# Patient Record
Sex: Male | Born: 1970 | Race: Black or African American | Hispanic: No | Marital: Married | Smoking: Former smoker
Health system: Southern US, Community
[De-identification: ages and names within clinical notes are randomized; demographics above are authoritative.]

## PROBLEM LIST (undated history)

## (undated) DIAGNOSIS — R519 Headache, unspecified: Secondary | ICD-10-CM

## (undated) DIAGNOSIS — G4730 Sleep apnea, unspecified: Secondary | ICD-10-CM

## (undated) DIAGNOSIS — M5430 Sciatica, unspecified side: Secondary | ICD-10-CM

## (undated) DIAGNOSIS — G8929 Other chronic pain: Secondary | ICD-10-CM

## (undated) DIAGNOSIS — I10 Essential (primary) hypertension: Secondary | ICD-10-CM

## (undated) DIAGNOSIS — E785 Hyperlipidemia, unspecified: Secondary | ICD-10-CM

## (undated) DIAGNOSIS — L0291 Cutaneous abscess, unspecified: Secondary | ICD-10-CM

## (undated) DIAGNOSIS — J40 Bronchitis, not specified as acute or chronic: Secondary | ICD-10-CM

## (undated) DIAGNOSIS — M549 Dorsalgia, unspecified: Secondary | ICD-10-CM

## (undated) DIAGNOSIS — R51 Headache: Secondary | ICD-10-CM

## (undated) DIAGNOSIS — G4733 Obstructive sleep apnea (adult) (pediatric): Secondary | ICD-10-CM

## (undated) HISTORY — DX: Obstructive sleep apnea (adult) (pediatric): G47.33

## (undated) HISTORY — PX: NO PAST SURGERIES: SHX2092

---

## 2005-03-08 ENCOUNTER — Emergency Department (HOSPITAL_COMMUNITY): Admission: EM | Admit: 2005-03-08 | Discharge: 2005-03-08 | Payer: Self-pay | Admitting: Emergency Medicine

## 2007-08-21 ENCOUNTER — Emergency Department (HOSPITAL_COMMUNITY): Admission: EM | Admit: 2007-08-21 | Discharge: 2007-08-21 | Payer: Self-pay | Admitting: Emergency Medicine

## 2007-09-07 ENCOUNTER — Emergency Department (HOSPITAL_COMMUNITY): Admission: EM | Admit: 2007-09-07 | Discharge: 2007-09-07 | Payer: Self-pay | Admitting: Emergency Medicine

## 2007-10-09 ENCOUNTER — Emergency Department (HOSPITAL_COMMUNITY): Admission: EM | Admit: 2007-10-09 | Discharge: 2007-10-10 | Payer: Self-pay | Admitting: Emergency Medicine

## 2009-01-22 ENCOUNTER — Encounter: Admission: RE | Admit: 2009-01-22 | Discharge: 2009-01-22 | Payer: Self-pay | Admitting: Internal Medicine

## 2009-04-16 ENCOUNTER — Emergency Department (HOSPITAL_COMMUNITY): Admission: EM | Admit: 2009-04-16 | Discharge: 2009-04-16 | Payer: Self-pay | Admitting: Emergency Medicine

## 2009-04-23 ENCOUNTER — Emergency Department (HOSPITAL_COMMUNITY): Admission: EM | Admit: 2009-04-23 | Discharge: 2009-04-24 | Payer: Self-pay | Admitting: Emergency Medicine

## 2009-05-03 ENCOUNTER — Emergency Department (HOSPITAL_COMMUNITY): Admission: EM | Admit: 2009-05-03 | Discharge: 2009-05-03 | Payer: Self-pay | Admitting: Emergency Medicine

## 2009-05-04 ENCOUNTER — Emergency Department (HOSPITAL_COMMUNITY): Admission: EM | Admit: 2009-05-04 | Discharge: 2009-05-04 | Payer: Self-pay | Admitting: Emergency Medicine

## 2009-05-10 ENCOUNTER — Emergency Department (HOSPITAL_COMMUNITY): Admission: EM | Admit: 2009-05-10 | Discharge: 2009-05-11 | Payer: Self-pay | Admitting: Emergency Medicine

## 2009-05-22 ENCOUNTER — Emergency Department (HOSPITAL_COMMUNITY): Admission: EM | Admit: 2009-05-22 | Discharge: 2009-05-22 | Payer: Self-pay | Admitting: Emergency Medicine

## 2009-06-28 ENCOUNTER — Emergency Department (HOSPITAL_COMMUNITY): Admission: EM | Admit: 2009-06-28 | Discharge: 2009-06-28 | Payer: Self-pay | Admitting: Emergency Medicine

## 2009-07-08 ENCOUNTER — Emergency Department (HOSPITAL_COMMUNITY): Admission: EM | Admit: 2009-07-08 | Discharge: 2009-07-08 | Payer: Self-pay | Admitting: Emergency Medicine

## 2009-07-17 ENCOUNTER — Emergency Department (HOSPITAL_COMMUNITY): Admission: EM | Admit: 2009-07-17 | Discharge: 2009-07-17 | Payer: Self-pay | Admitting: Emergency Medicine

## 2009-08-01 ENCOUNTER — Emergency Department (HOSPITAL_COMMUNITY): Admission: EM | Admit: 2009-08-01 | Discharge: 2009-08-02 | Payer: Self-pay | Admitting: Emergency Medicine

## 2009-08-02 ENCOUNTER — Emergency Department (HOSPITAL_COMMUNITY): Admission: EM | Admit: 2009-08-02 | Discharge: 2009-08-02 | Payer: Self-pay | Admitting: Emergency Medicine

## 2009-08-10 ENCOUNTER — Emergency Department (HOSPITAL_COMMUNITY): Admission: EM | Admit: 2009-08-10 | Discharge: 2009-08-10 | Payer: Self-pay | Admitting: Emergency Medicine

## 2009-08-16 ENCOUNTER — Emergency Department (HOSPITAL_COMMUNITY): Admission: EM | Admit: 2009-08-16 | Discharge: 2009-08-16 | Payer: Self-pay | Admitting: Emergency Medicine

## 2009-08-21 ENCOUNTER — Emergency Department (HOSPITAL_COMMUNITY): Admission: EM | Admit: 2009-08-21 | Discharge: 2009-08-21 | Payer: Self-pay | Admitting: Emergency Medicine

## 2009-08-26 ENCOUNTER — Emergency Department (HOSPITAL_COMMUNITY): Admission: EM | Admit: 2009-08-26 | Discharge: 2009-08-26 | Payer: Self-pay | Admitting: Emergency Medicine

## 2009-09-20 ENCOUNTER — Emergency Department (HOSPITAL_COMMUNITY): Admission: EM | Admit: 2009-09-20 | Discharge: 2009-09-20 | Payer: Self-pay | Admitting: Emergency Medicine

## 2009-09-25 ENCOUNTER — Emergency Department (HOSPITAL_COMMUNITY): Admission: EM | Admit: 2009-09-25 | Discharge: 2009-09-25 | Payer: Self-pay | Admitting: Emergency Medicine

## 2009-10-07 ENCOUNTER — Emergency Department (HOSPITAL_COMMUNITY): Admission: EM | Admit: 2009-10-07 | Discharge: 2009-10-07 | Payer: Self-pay | Admitting: Emergency Medicine

## 2009-12-12 ENCOUNTER — Emergency Department (HOSPITAL_COMMUNITY): Admission: EM | Admit: 2009-12-12 | Discharge: 2009-12-12 | Payer: Self-pay | Admitting: Emergency Medicine

## 2010-01-05 ENCOUNTER — Emergency Department (HOSPITAL_COMMUNITY): Admission: EM | Admit: 2010-01-05 | Discharge: 2010-01-05 | Payer: Self-pay | Admitting: Emergency Medicine

## 2010-04-27 ENCOUNTER — Emergency Department (HOSPITAL_COMMUNITY): Admission: EM | Admit: 2010-04-27 | Discharge: 2010-04-28 | Payer: Self-pay | Admitting: Emergency Medicine

## 2010-05-23 ENCOUNTER — Emergency Department (HOSPITAL_COMMUNITY)
Admission: EM | Admit: 2010-05-23 | Discharge: 2010-05-23 | Payer: Self-pay | Source: Home / Self Care | Admitting: Emergency Medicine

## 2010-06-12 ENCOUNTER — Emergency Department (HOSPITAL_COMMUNITY)
Admission: EM | Admit: 2010-06-12 | Discharge: 2010-06-12 | Payer: Self-pay | Source: Home / Self Care | Admitting: Emergency Medicine

## 2010-07-25 ENCOUNTER — Emergency Department (HOSPITAL_COMMUNITY): Payer: Medicaid Other

## 2010-07-25 ENCOUNTER — Emergency Department (HOSPITAL_COMMUNITY)
Admission: EM | Admit: 2010-07-25 | Discharge: 2010-07-25 | Disposition: A | Discharge status: Home / Self Care | Payer: Medicaid Other | Attending: Emergency Medicine | Admitting: Emergency Medicine

## 2010-07-25 DIAGNOSIS — M7989 Other specified soft tissue disorders: Secondary | ICD-10-CM | POA: Insufficient documentation

## 2010-07-25 DIAGNOSIS — E119 Type 2 diabetes mellitus without complications: Secondary | ICD-10-CM | POA: Insufficient documentation

## 2010-07-25 DIAGNOSIS — S5010XA Contusion of unspecified forearm, initial encounter: Secondary | ICD-10-CM | POA: Insufficient documentation

## 2010-07-25 DIAGNOSIS — S59909A Unspecified injury of unspecified elbow, initial encounter: Secondary | ICD-10-CM | POA: Insufficient documentation

## 2010-07-25 DIAGNOSIS — Z79899 Other long term (current) drug therapy: Secondary | ICD-10-CM | POA: Insufficient documentation

## 2010-07-25 DIAGNOSIS — Y93E1 Activity, personal bathing and showering: Secondary | ICD-10-CM | POA: Insufficient documentation

## 2010-07-25 DIAGNOSIS — S6990XA Unspecified injury of unspecified wrist, hand and finger(s), initial encounter: Secondary | ICD-10-CM | POA: Insufficient documentation

## 2010-07-25 DIAGNOSIS — G8929 Other chronic pain: Secondary | ICD-10-CM | POA: Insufficient documentation

## 2010-07-25 DIAGNOSIS — W010XXA Fall on same level from slipping, tripping and stumbling without subsequent striking against object, initial encounter: Secondary | ICD-10-CM | POA: Insufficient documentation

## 2010-07-25 DIAGNOSIS — Y92009 Unspecified place in unspecified non-institutional (private) residence as the place of occurrence of the external cause: Secondary | ICD-10-CM | POA: Insufficient documentation

## 2010-07-25 DIAGNOSIS — S59919A Unspecified injury of unspecified forearm, initial encounter: Secondary | ICD-10-CM | POA: Insufficient documentation

## 2010-07-25 DIAGNOSIS — J45909 Unspecified asthma, uncomplicated: Secondary | ICD-10-CM | POA: Insufficient documentation

## 2010-07-25 DIAGNOSIS — I10 Essential (primary) hypertension: Secondary | ICD-10-CM | POA: Insufficient documentation

## 2010-08-30 LAB — GLUCOSE, CAPILLARY

## 2010-08-31 ENCOUNTER — Emergency Department (HOSPITAL_COMMUNITY)
Admission: EM | Admit: 2010-08-31 | Discharge: 2010-09-01 | Disposition: A | Discharge status: Home / Self Care | Payer: Medicaid Other | Attending: Emergency Medicine | Admitting: Emergency Medicine

## 2010-08-31 DIAGNOSIS — X500XXA Overexertion from strenuous movement or load, initial encounter: Secondary | ICD-10-CM | POA: Insufficient documentation

## 2010-08-31 DIAGNOSIS — S93409A Sprain of unspecified ligament of unspecified ankle, initial encounter: Secondary | ICD-10-CM | POA: Insufficient documentation

## 2010-08-31 DIAGNOSIS — Y92009 Unspecified place in unspecified non-institutional (private) residence as the place of occurrence of the external cause: Secondary | ICD-10-CM | POA: Insufficient documentation

## 2010-09-01 ENCOUNTER — Emergency Department (HOSPITAL_COMMUNITY): Payer: Medicaid Other

## 2010-09-03 LAB — DIFFERENTIAL
Basophils Absolute: 0 10*3/uL (ref 0.0–0.1)
Basophils Relative: 0 % (ref 0–1)
Lymphocytes Relative: 26 % (ref 12–46)
Monocytes Relative: 6 % (ref 3–12)
Neutro Abs: 5.7 10*3/uL (ref 1.7–7.7)
Neutrophils Relative %: 66 % (ref 43–77)

## 2010-09-03 LAB — CBC
Hemoglobin: 15 g/dL (ref 13.0–17.0)
MCHC: 33.3 g/dL (ref 30.0–36.0)
RDW: 12.9 % (ref 11.5–15.5)
WBC: 8.6 10*3/uL (ref 4.0–10.5)

## 2010-09-07 LAB — GLUCOSE, CAPILLARY: Glucose-Capillary: 119 mg/dL — ABNORMAL HIGH (ref 70–99)

## 2010-09-11 LAB — GLUCOSE, CAPILLARY: Glucose-Capillary: 238 mg/dL — ABNORMAL HIGH (ref 70–99)

## 2010-09-20 ENCOUNTER — Emergency Department (HOSPITAL_COMMUNITY)
Admission: EM | Admit: 2010-09-20 | Discharge: 2010-09-20 | Disposition: A | Discharge status: Home / Self Care | Payer: Medicaid Other | Attending: Emergency Medicine | Admitting: Emergency Medicine

## 2010-09-20 ENCOUNTER — Emergency Department (HOSPITAL_COMMUNITY): Payer: Medicaid Other

## 2010-09-20 DIAGNOSIS — R0789 Other chest pain: Secondary | ICD-10-CM | POA: Insufficient documentation

## 2010-09-20 DIAGNOSIS — G4730 Sleep apnea, unspecified: Secondary | ICD-10-CM | POA: Insufficient documentation

## 2010-09-20 DIAGNOSIS — J45909 Unspecified asthma, uncomplicated: Secondary | ICD-10-CM | POA: Insufficient documentation

## 2010-09-20 DIAGNOSIS — Z79899 Other long term (current) drug therapy: Secondary | ICD-10-CM | POA: Insufficient documentation

## 2010-09-20 DIAGNOSIS — E119 Type 2 diabetes mellitus without complications: Secondary | ICD-10-CM | POA: Insufficient documentation

## 2010-09-20 DIAGNOSIS — I10 Essential (primary) hypertension: Secondary | ICD-10-CM | POA: Insufficient documentation

## 2010-09-20 DIAGNOSIS — E669 Obesity, unspecified: Secondary | ICD-10-CM | POA: Insufficient documentation

## 2010-09-20 DIAGNOSIS — W208XXA Other cause of strike by thrown, projected or falling object, initial encounter: Secondary | ICD-10-CM | POA: Insufficient documentation

## 2010-09-20 DIAGNOSIS — Y92009 Unspecified place in unspecified non-institutional (private) residence as the place of occurrence of the external cause: Secondary | ICD-10-CM | POA: Insufficient documentation

## 2010-09-20 LAB — COMPREHENSIVE METABOLIC PANEL
AST: 20 U/L (ref 0–37)
BUN: 11 mg/dL (ref 6–23)
CO2: 26 mEq/L (ref 19–32)
Chloride: 99 mEq/L (ref 96–112)
Creatinine, Ser: 0.72 mg/dL (ref 0.4–1.5)
GFR calc Af Amer: >60 mL/min (ref >60)
GFR calc non Af Amer: >60 mL/min (ref >60)
Glucose, Bld: 344 mg/dL — ABNORMAL HIGH (ref 70–99)
Total Bilirubin: 0.5 mg/dL (ref 0.3–1.2)

## 2010-09-20 LAB — DIFFERENTIAL
Basophils Absolute: 0 10*3/uL (ref 0.0–0.1)
Eosinophils Relative: 3 % (ref 0–5)
Lymphocytes Relative: 45 % (ref 12–46)

## 2010-09-20 LAB — URINALYSIS, ROUTINE W REFLEX MICROSCOPIC
Bilirubin Urine: NEGATIVE
Glucose, UA: >1000 mg/dL — AB
Hgb urine dipstick: NEGATIVE
Protein, ur: NEGATIVE mg/dL

## 2010-09-20 LAB — CBC
HCT: 44 % (ref 39.0–52.0)
Hemoglobin: 14.7 g/dL (ref 13.0–17.0)
MCV: 85.2 fL (ref 78.0–100.0)
RBC: 5.17 MIL/uL (ref 4.22–5.81)
WBC: 6.6 10*3/uL (ref 4.0–10.5)

## 2010-09-20 LAB — LIPASE, BLOOD: Lipase: 23 U/L (ref 11–59)

## 2010-09-21 LAB — BASIC METABOLIC PANEL
BUN: 12 mg/dL (ref 6–23)
BUN: 12 mg/dL (ref 6–23)
CO2: 27 mEq/L (ref 19–32)
Calcium: 9.3 mg/dL (ref 8.4–10.5)
Chloride: 102 mEq/L (ref 96–112)
Creatinine, Ser: 1.13 mg/dL (ref 0.4–1.5)
GFR calc non Af Amer: >60 mL/min (ref >60)
Glucose, Bld: 319 mg/dL — ABNORMAL HIGH (ref 70–99)
Glucose, Bld: 311 mg/dL — ABNORMAL HIGH (ref 70–99)
Sodium: 137 mEq/L (ref 135–145)

## 2010-09-21 LAB — URINALYSIS, ROUTINE W REFLEX MICROSCOPIC
Bilirubin Urine: NEGATIVE
Bilirubin Urine: NEGATIVE
Ketones, ur: NEGATIVE mg/dL
Nitrite: NEGATIVE
Nitrite: NEGATIVE
Protein, ur: NEGATIVE mg/dL
Specific Gravity, Urine: 1.01 (ref 1.005–1.030)
Specific Gravity, Urine: 1.02 (ref 1.005–1.030)
Urobilinogen, UA: 0.2 mg/dL (ref 0.0–1.0)
pH: 5.5 (ref 5.0–8.0)

## 2010-09-21 LAB — CBC
MCHC: 33.5 g/dL (ref 30.0–36.0)
MCV: 85.7 fL (ref 78.0–100.0)
Platelets: 248 10*3/uL (ref 150–400)

## 2010-09-21 LAB — DIFFERENTIAL
Basophils Relative: 0 % (ref 0–1)
Eosinophils Absolute: 0.1 10*3/uL (ref 0.0–0.7)
Eosinophils Relative: 2 % (ref 0–5)
Monocytes Relative: 8 % (ref 3–12)
Neutrophils Relative %: 48 % (ref 43–77)

## 2010-09-21 LAB — TYPE AND SCREEN

## 2010-09-21 LAB — URINE MICROSCOPIC-ADD ON

## 2010-09-21 LAB — PROTIME-INR
INR: 1 (ref 0.00–1.49)
Prothrombin Time: 13.4 seconds (ref 11.6–15.2)

## 2010-09-30 ENCOUNTER — Emergency Department (HOSPITAL_COMMUNITY)
Admission: EM | Admit: 2010-09-30 | Discharge: 2010-10-01 | Disposition: A | Discharge status: Home / Self Care | Payer: Medicaid Other | Attending: Emergency Medicine | Admitting: Emergency Medicine

## 2010-09-30 DIAGNOSIS — E119 Type 2 diabetes mellitus without complications: Secondary | ICD-10-CM | POA: Insufficient documentation

## 2010-09-30 DIAGNOSIS — I10 Essential (primary) hypertension: Secondary | ICD-10-CM | POA: Insufficient documentation

## 2010-09-30 DIAGNOSIS — L03119 Cellulitis of unspecified part of limb: Secondary | ICD-10-CM | POA: Insufficient documentation

## 2010-09-30 DIAGNOSIS — J45909 Unspecified asthma, uncomplicated: Secondary | ICD-10-CM | POA: Insufficient documentation

## 2010-09-30 DIAGNOSIS — L02419 Cutaneous abscess of limb, unspecified: Secondary | ICD-10-CM | POA: Insufficient documentation

## 2010-10-16 ENCOUNTER — Emergency Department (HOSPITAL_COMMUNITY)
Admission: EM | Admit: 2010-10-16 | Discharge: 2010-10-16 | Disposition: A | Discharge status: Home / Self Care | Payer: Medicaid Other | Attending: Emergency Medicine | Admitting: Emergency Medicine

## 2010-10-16 ENCOUNTER — Emergency Department (HOSPITAL_COMMUNITY): Payer: Medicaid Other

## 2010-10-16 DIAGNOSIS — J45909 Unspecified asthma, uncomplicated: Secondary | ICD-10-CM | POA: Insufficient documentation

## 2010-10-16 DIAGNOSIS — M545 Low back pain, unspecified: Secondary | ICD-10-CM | POA: Insufficient documentation

## 2010-10-16 DIAGNOSIS — I10 Essential (primary) hypertension: Secondary | ICD-10-CM | POA: Insufficient documentation

## 2010-10-16 DIAGNOSIS — E119 Type 2 diabetes mellitus without complications: Secondary | ICD-10-CM | POA: Insufficient documentation

## 2010-10-16 DIAGNOSIS — Y9289 Other specified places as the place of occurrence of the external cause: Secondary | ICD-10-CM | POA: Insufficient documentation

## 2010-10-16 DIAGNOSIS — M542 Cervicalgia: Secondary | ICD-10-CM | POA: Insufficient documentation

## 2010-10-16 DIAGNOSIS — E669 Obesity, unspecified: Secondary | ICD-10-CM | POA: Insufficient documentation

## 2010-10-16 DIAGNOSIS — F17200 Nicotine dependence, unspecified, uncomplicated: Secondary | ICD-10-CM | POA: Insufficient documentation

## 2010-10-16 DIAGNOSIS — Z79899 Other long term (current) drug therapy: Secondary | ICD-10-CM | POA: Insufficient documentation

## 2010-10-16 DIAGNOSIS — G8929 Other chronic pain: Secondary | ICD-10-CM | POA: Insufficient documentation

## 2010-11-11 ENCOUNTER — Emergency Department (HOSPITAL_COMMUNITY)
Admission: EM | Admit: 2010-11-11 | Discharge: 2010-11-11 | Disposition: A | Discharge status: Home / Self Care | Payer: Medicaid Other | Attending: Emergency Medicine | Admitting: Emergency Medicine

## 2010-11-11 DIAGNOSIS — Z79899 Other long term (current) drug therapy: Secondary | ICD-10-CM | POA: Insufficient documentation

## 2010-11-11 DIAGNOSIS — J45909 Unspecified asthma, uncomplicated: Secondary | ICD-10-CM | POA: Insufficient documentation

## 2010-11-11 DIAGNOSIS — E119 Type 2 diabetes mellitus without complications: Secondary | ICD-10-CM | POA: Insufficient documentation

## 2010-11-11 DIAGNOSIS — F17200 Nicotine dependence, unspecified, uncomplicated: Secondary | ICD-10-CM | POA: Insufficient documentation

## 2010-11-11 DIAGNOSIS — L0233 Carbuncle of buttock: Secondary | ICD-10-CM | POA: Insufficient documentation

## 2010-11-11 DIAGNOSIS — I10 Essential (primary) hypertension: Secondary | ICD-10-CM | POA: Insufficient documentation

## 2010-11-11 DIAGNOSIS — G4730 Sleep apnea, unspecified: Secondary | ICD-10-CM | POA: Insufficient documentation

## 2010-11-28 ENCOUNTER — Emergency Department (HOSPITAL_COMMUNITY)
Admission: EM | Admit: 2010-11-28 | Discharge: 2010-11-28 | Disposition: A | Discharge status: Home / Self Care | Payer: Medicaid Other | Attending: Emergency Medicine | Admitting: Emergency Medicine

## 2010-11-28 DIAGNOSIS — S335XXA Sprain of ligaments of lumbar spine, initial encounter: Secondary | ICD-10-CM | POA: Insufficient documentation

## 2010-11-28 DIAGNOSIS — M545 Low back pain, unspecified: Secondary | ICD-10-CM | POA: Insufficient documentation

## 2010-11-28 DIAGNOSIS — I10 Essential (primary) hypertension: Secondary | ICD-10-CM | POA: Insufficient documentation

## 2010-11-28 DIAGNOSIS — E119 Type 2 diabetes mellitus without complications: Secondary | ICD-10-CM | POA: Insufficient documentation

## 2010-11-28 DIAGNOSIS — J45909 Unspecified asthma, uncomplicated: Secondary | ICD-10-CM | POA: Insufficient documentation

## 2010-11-28 DIAGNOSIS — X58XXXA Exposure to other specified factors, initial encounter: Secondary | ICD-10-CM | POA: Insufficient documentation

## 2010-12-02 ENCOUNTER — Emergency Department (HOSPITAL_COMMUNITY)
Admission: EM | Admit: 2010-12-02 | Discharge: 2010-12-02 | Disposition: A | Discharge status: Home / Self Care | Payer: Medicaid Other | Attending: Emergency Medicine | Admitting: Emergency Medicine

## 2010-12-02 DIAGNOSIS — E119 Type 2 diabetes mellitus without complications: Secondary | ICD-10-CM | POA: Insufficient documentation

## 2010-12-02 DIAGNOSIS — R1013 Epigastric pain: Secondary | ICD-10-CM | POA: Insufficient documentation

## 2010-12-02 DIAGNOSIS — J45909 Unspecified asthma, uncomplicated: Secondary | ICD-10-CM | POA: Insufficient documentation

## 2010-12-02 DIAGNOSIS — I10 Essential (primary) hypertension: Secondary | ICD-10-CM | POA: Insufficient documentation

## 2010-12-02 DIAGNOSIS — F17200 Nicotine dependence, unspecified, uncomplicated: Secondary | ICD-10-CM | POA: Insufficient documentation

## 2010-12-02 DIAGNOSIS — K649 Unspecified hemorrhoids: Secondary | ICD-10-CM | POA: Insufficient documentation

## 2010-12-02 LAB — URINALYSIS, ROUTINE W REFLEX MICROSCOPIC
Glucose, UA: 100 mg/dL — AB
Hgb urine dipstick: NEGATIVE
Ketones, ur: NEGATIVE mg/dL
Protein, ur: NEGATIVE mg/dL
pH: 5.5 (ref 5.0–8.0)

## 2010-12-19 ENCOUNTER — Emergency Department (HOSPITAL_COMMUNITY)
Admission: EM | Admit: 2010-12-19 | Discharge: 2010-12-20 | Disposition: A | Discharge status: Home / Self Care | Payer: Medicaid Other | Attending: Emergency Medicine | Admitting: Emergency Medicine

## 2010-12-19 ENCOUNTER — Emergency Department (HOSPITAL_COMMUNITY): Payer: Medicaid Other

## 2010-12-19 DIAGNOSIS — S93409A Sprain of unspecified ligament of unspecified ankle, initial encounter: Secondary | ICD-10-CM | POA: Insufficient documentation

## 2010-12-19 DIAGNOSIS — I10 Essential (primary) hypertension: Secondary | ICD-10-CM | POA: Insufficient documentation

## 2010-12-19 DIAGNOSIS — E119 Type 2 diabetes mellitus without complications: Secondary | ICD-10-CM | POA: Insufficient documentation

## 2010-12-19 DIAGNOSIS — X500XXA Overexertion from strenuous movement or load, initial encounter: Secondary | ICD-10-CM | POA: Insufficient documentation

## 2010-12-19 DIAGNOSIS — M25579 Pain in unspecified ankle and joints of unspecified foot: Secondary | ICD-10-CM | POA: Insufficient documentation

## 2010-12-24 ENCOUNTER — Emergency Department (HOSPITAL_COMMUNITY)
Admission: EM | Admit: 2010-12-24 | Discharge: 2010-12-24 | Discharge status: Against Medical Advice | Payer: Medicaid Other | Attending: Emergency Medicine | Admitting: Emergency Medicine

## 2010-12-24 DIAGNOSIS — Z0389 Encounter for observation for other suspected diseases and conditions ruled out: Secondary | ICD-10-CM | POA: Insufficient documentation

## 2010-12-27 ENCOUNTER — Encounter: Payer: Self-pay | Admitting: *Deleted

## 2010-12-27 ENCOUNTER — Emergency Department (HOSPITAL_COMMUNITY)
Admission: EM | Admit: 2010-12-27 | Discharge: 2010-12-27 | Disposition: A | Discharge status: Home / Self Care | Payer: Medicaid Other | Attending: Emergency Medicine | Admitting: Emergency Medicine

## 2010-12-27 DIAGNOSIS — I10 Essential (primary) hypertension: Secondary | ICD-10-CM | POA: Insufficient documentation

## 2010-12-27 DIAGNOSIS — E119 Type 2 diabetes mellitus without complications: Secondary | ICD-10-CM | POA: Insufficient documentation

## 2010-12-27 DIAGNOSIS — F17200 Nicotine dependence, unspecified, uncomplicated: Secondary | ICD-10-CM | POA: Insufficient documentation

## 2010-12-27 DIAGNOSIS — IMO0002 Reserved for concepts with insufficient information to code with codable children: Secondary | ICD-10-CM | POA: Insufficient documentation

## 2010-12-27 DIAGNOSIS — L03319 Cellulitis of trunk, unspecified: Secondary | ICD-10-CM

## 2010-12-27 HISTORY — DX: Essential (primary) hypertension: I10

## 2010-12-27 HISTORY — DX: Sleep apnea, unspecified: G47.30

## 2010-12-27 MED ORDER — HYDROCODONE-ACETAMINOPHEN 5-325 MG PO TABS
1.0000 | ORAL_TABLET | Freq: Once | ORAL | Status: AC
  Administered 2010-12-27: 1 via ORAL
  Filled 2010-12-27: qty 1

## 2010-12-27 MED ORDER — DOXYCYCLINE HYCLATE 100 MG PO CAPS: 100.0000 mg | ORAL_CAPSULE | Freq: Two times a day (BID) | ORAL | Status: AC

## 2010-12-27 MED ORDER — DOXYCYCLINE HYCLATE 100 MG PO TABS
100.0000 mg | ORAL_TABLET | Freq: Once | ORAL | Status: AC
  Administered 2010-12-27: 100 mg via ORAL
  Filled 2010-12-27: qty 1

## 2010-12-27 MED ORDER — HYDROCODONE-ACETAMINOPHEN 5-325 MG PO TABS: 2.0000 | ORAL_TABLET | ORAL | Status: AC | PRN

## 2010-12-27 NOTE — ED Notes (Signed)
Patient sitting quietly in chair watching TV. Airway patent. No acute distress noted. Patient given ginger ale per request.

## 2010-12-27 NOTE — ED Provider Notes (Signed)
History     Chief Complaint  Patient presents with  . Abscess   Patient is a 40 y.o. male presenting with abscess. The history is provided by the patient.  Abscess  This is a recurrent problem. Episode onset: 2-3 days. The problem occurs frequently. The problem has been gradually improving. Affected Location: Right axilla. The abscess is characterized by painfulness. Pertinent negatives include no fever.    Past Medical History  Diagnosis Date  . Hypertension   . Diabetes mellitus   . Sleep apnea     History reviewed. No pertinent past surgical history.  No family history on file.  History  Substance Use Topics  . Smoking status: Current Everyday Smoker -- 0.5 packs/day  . Smokeless tobacco: Not on file  . Alcohol Use:       Review of Systems  Constitutional: Negative for fever.  Skin:       Abscess - right axilla  All other systems reviewed and are negative.    Physical Exam  BP 120/58  Pulse 87  Temp(Src) 98.6 F (37 C) (Oral)  Resp 20  Ht 6\' 1"  (1.854 m)  Wt 350 lb (158.759 kg)  BMI 46.18 kg/m2  SpO2 96%  Physical Exam  Constitutional: He is oriented to person, place, and time. He appears well-developed and well-nourished.  Pulmonary/Chest: Effort normal.  Musculoskeletal: Normal range of motion.  Neurological: He is alert and oriented to person, place, and time.  Skin: Skin is warm and dry.       ED Course  Procedures  MDM        Marlon Pel, [place] 12/27/10 1845

## 2010-12-27 NOTE — ED Notes (Signed)
Boil under  Right arm

## 2010-12-28 NOTE — ED Provider Notes (Signed)
Medical screening examination/treatment/procedure(s) were performed by non-physician practitioner and as supervising physician I was immediately available for consultation/collaboration.  Joya Gaskins, MD 12/28/10 [phone]

## 2011-01-04 ENCOUNTER — Emergency Department (HOSPITAL_COMMUNITY): Payer: Medicaid Other

## 2011-01-04 ENCOUNTER — Encounter (HOSPITAL_COMMUNITY): Payer: Self-pay | Admitting: Emergency Medicine

## 2011-01-04 ENCOUNTER — Emergency Department (HOSPITAL_COMMUNITY)
Admission: EM | Admit: 2011-01-04 | Discharge: 2011-01-04 | Disposition: A | Discharge status: Home / Self Care | Payer: Medicaid Other | Attending: Emergency Medicine | Admitting: Emergency Medicine

## 2011-01-04 DIAGNOSIS — M533 Sacrococcygeal disorders, not elsewhere classified: Secondary | ICD-10-CM

## 2011-01-04 DIAGNOSIS — M549 Dorsalgia, unspecified: Secondary | ICD-10-CM | POA: Insufficient documentation

## 2011-01-04 DIAGNOSIS — F17200 Nicotine dependence, unspecified, uncomplicated: Secondary | ICD-10-CM | POA: Insufficient documentation

## 2011-01-04 DIAGNOSIS — W07XXXA Fall from chair, initial encounter: Secondary | ICD-10-CM | POA: Insufficient documentation

## 2011-01-04 MED ORDER — OXYCODONE-ACETAMINOPHEN 5-325 MG PO TABS
1.0000 | ORAL_TABLET | Freq: Once | ORAL | Status: AC
  Administered 2011-01-04: 1 via ORAL
  Filled 2011-01-04: qty 1

## 2011-01-04 NOTE — ED Provider Notes (Signed)
History     Chief Complaint  Patient presents with  . Tailbone Pain   Patient is a 40 y.o. male presenting with back pain.  Back Pain  This is a new problem. The current episode started 6 to 12 hours ago. The problem occurs constantly. The problem has not changed since onset.The pain is associated with falling. Pain location: sacrum/coccyx. The pain is moderate. The symptoms are aggravated by certain positions. Pertinent negatives include no abdominal pain, no bowel incontinence, no bladder incontinence and no weakness.   Pt reports he was sitting in a chair, it broke and he fell directly on "the tailbone"  Reports pain ever since.  Past Medical History  Diagnosis Date  . Hypertension   . Diabetes mellitus   . Sleep apnea     History reviewed. No pertinent past surgical history.  History reviewed. No pertinent family history.  History  Substance Use Topics  . Smoking status: Current Everyday Smoker -- 0.5 packs/day  . Smokeless tobacco: Not on file  . Alcohol Use: No      Review of Systems  Gastrointestinal: Negative for abdominal pain and bowel incontinence.  Genitourinary: Negative for bladder incontinence.  Musculoskeletal: Positive for back pain.  Neurological: Negative for weakness.    Physical Exam  BP 124/66  Pulse 87  Temp(Src) 98.4 F (36.9 C) (Oral)  Resp 18  Ht 6\' 1"  (1.854 m)  Wt 350 lb (158.759 kg)  BMI 46.18 kg/m2  Physical Exam  CONSTITUTIONAL: Well developed/well nourished HEAD AND FACE: Normocephalic/atraumatic EYES: EOMI/PERRL ENMT: Mucous membranes moist NECK: supple no meningeal signs SPINE:TL spine nontender, tenderness over lower sacrum.  No bruising/crepitance/stepoffs noted to spine ABDOMEN: soft, nontender, no rebound or guarding GU:no cva tenderness NEURO: Pt is awake/alert, moves all extremitiesx4, no focal motor deficits noted in the LE EXTREMITIES: full ROM SKIN: warm, color normal   ED Course  Procedures  MDM Nursing  notes reviewed and considered in documentation xrays reviewed and considered  I was called by radiology and informed pt had similar xrays performed tonight at [facility]. Pt denied initially when asked if he had went to any other hospitals He reports he is here for "second opinion" Pt stable for d/c      Joya Gaskins, MD 01/04/11 [phone]

## 2011-01-04 NOTE — ED Notes (Signed)
Patient states was sitting in plastic chair and it broke; patient states he landed on tailbone.  C/o tailbone pain

## 2011-01-24 ENCOUNTER — Emergency Department (HOSPITAL_COMMUNITY)
Admission: EM | Admit: 2011-01-24 | Discharge: 2011-01-24 | Disposition: A | Discharge status: Home / Self Care | Payer: Medicaid Other | Attending: Emergency Medicine | Admitting: Emergency Medicine

## 2011-01-24 ENCOUNTER — Encounter (HOSPITAL_COMMUNITY): Payer: Self-pay | Admitting: *Deleted

## 2011-01-24 DIAGNOSIS — I10 Essential (primary) hypertension: Secondary | ICD-10-CM | POA: Insufficient documentation

## 2011-01-24 DIAGNOSIS — E119 Type 2 diabetes mellitus without complications: Secondary | ICD-10-CM | POA: Insufficient documentation

## 2011-01-24 DIAGNOSIS — N492 Inflammatory disorders of scrotum: Secondary | ICD-10-CM

## 2011-01-24 DIAGNOSIS — F17200 Nicotine dependence, unspecified, uncomplicated: Secondary | ICD-10-CM | POA: Insufficient documentation

## 2011-01-24 DIAGNOSIS — N498 Inflammatory disorders of other specified male genital organs: Secondary | ICD-10-CM | POA: Insufficient documentation

## 2011-01-24 HISTORY — DX: Cutaneous abscess, unspecified: L02.91

## 2011-01-24 MED ORDER — CLINDAMYCIN HCL 150 MG PO CAPS: 300.0000 mg | ORAL_CAPSULE | Freq: Three times a day (TID) | ORAL | Status: AC

## 2011-01-24 MED ORDER — OXYCODONE-ACETAMINOPHEN 5-325 MG PO TABS
2.0000 | ORAL_TABLET | Freq: Once | ORAL | Status: AC
  Administered 2011-01-24: 2 via ORAL
  Filled 2011-01-24: qty 2

## 2011-01-24 MED ORDER — OXYCODONE-ACETAMINOPHEN 5-325 MG PO TABS: 1.0000 | ORAL_TABLET | ORAL | Status: AC | PRN

## 2011-01-24 MED ORDER — IBUPROFEN 800 MG PO TABS: 800.0000 mg | ORAL_TABLET | Freq: Three times a day (TID) | ORAL | Status: AC

## 2011-01-24 MED ORDER — BACITRACIN ZINC 500 UNIT/GM EX OINT
TOPICAL_OINTMENT | Freq: Once | CUTANEOUS | Status: AC
  Administered 2011-01-24: 22:00:00 via TOPICAL
  Filled 2011-01-24: qty 0.9

## 2011-01-24 NOTE — ED Notes (Signed)
Pt self ambulated out with a steady gait 

## 2011-01-24 NOTE — ED Provider Notes (Signed)
History     CSN: [id number] Arrival date & time: 01/24/2011  9:23 PM  Chief Complaint  Patient presents with  . Abscess    a week ago   HPI Comments: Patient is a 40 year old male who presents to [place] with a history of an abscess on his scrotum. He states that this has been ongoing for the last week. He was seen at an outside hospital and started on a medication to which he cannot remember the name. Over the last week the lesion has opened up and is drained but indolent has popped up beside it. This area is mildly tender, not surrounded by painful skin and has no associated fevers, chills, nausea, vomiting. The abscesses are associated with tenderness to palpation. The patient is unsure of the name of the antibiotic he was on this week. He's had it for 6 days  Patient is a 40 y.o. male presenting with abscess. The history is provided by the patient.  Abscess  This is a new problem. The current episode started more than one week ago. The onset was gradual. The problem occurs continuously. The problem has been unchanged. Pertinent negatives include no fever and no vomiting.    Past Medical History  Diagnosis Date  . Hypertension   . Diabetes mellitus   . Sleep apnea   . Abscess     History reviewed. No pertinent past surgical history.  History reviewed. No pertinent family history.  History  Substance Use Topics  . Smoking status: Current Everyday Smoker -- 0.5 packs/day    Types: Cigarettes  . Smokeless tobacco: Not on file  . Alcohol Use: No      Review of Systems  Constitutional: Negative for fever and chills.  Gastrointestinal: Negative for nausea and vomiting.  Skin: Positive for rash.       abscess    Physical Exam  BP 107/50  Pulse 92  Temp(Src) 98.2 F (36.8 C) (Oral)  Resp 20  Ht 6\' 1"  (1.854 m)  Wt 350 lb (158.759 kg)  BMI 46.18 kg/m2  SpO2 97%  Physical Exam  Nursing note and vitals reviewed. Constitutional: He appears well-developed and well-nourished.  No distress.  HENT:  Head: Normocephalic and atraumatic.  Eyes: Conjunctivae are normal. Right eye exhibits no discharge. Left eye exhibits no discharge. No scleral icterus.  Cardiovascular: Normal rate and regular rhythm.   No murmur heard. Pulmonary/Chest: Effort normal and breath sounds normal.  Genitourinary:       Left hemiscrotum with 2 small draining abscesses, one 3 mm in diameter, the other is approximately 6 mm in diameter. There is no surrounding induration or erythema of the skin and there is no inguinal lymphadenopathy.  Musculoskeletal: Normal range of motion. He exhibits no edema and no tenderness.  Neurological: He is alert.  Skin: Skin is warm and dry. He is not diaphoretic.    ED Course  Procedures  MDM Patient has normal vital signs, no fever, too small and draining abscesses. These are both draining a small amount of purulent. There is no indication for opening these up with a scalpel at this time. I will refer him to urology and change antibiotic to clindamycin to hopefully prevent this from spreading. I've encouraged the patient to followup for any worsening of his condition and he has expressed his understanding.      Vida Roller, MD 01/24/11 2209

## 2011-01-24 NOTE — ED Notes (Signed)
Patient has an abscess a week ago and was seen at [facility] and was started on an antibiotic, patient states that the abscess is not better and is bigger, abscess located on testicles per pt

## 2011-02-04 ENCOUNTER — Encounter (HOSPITAL_COMMUNITY): Payer: Self-pay | Admitting: *Deleted

## 2011-02-04 ENCOUNTER — Emergency Department (HOSPITAL_COMMUNITY)
Admission: EM | Admit: 2011-02-04 | Discharge: 2011-02-04 | Disposition: A | Discharge status: Home / Self Care | Payer: Medicaid Other | Attending: Emergency Medicine | Admitting: Emergency Medicine

## 2011-02-04 DIAGNOSIS — M5430 Sciatica, unspecified side: Secondary | ICD-10-CM | POA: Insufficient documentation

## 2011-02-04 DIAGNOSIS — I10 Essential (primary) hypertension: Secondary | ICD-10-CM | POA: Insufficient documentation

## 2011-02-04 DIAGNOSIS — E119 Type 2 diabetes mellitus without complications: Secondary | ICD-10-CM | POA: Insufficient documentation

## 2011-02-04 DIAGNOSIS — F17200 Nicotine dependence, unspecified, uncomplicated: Secondary | ICD-10-CM | POA: Insufficient documentation

## 2011-02-04 HISTORY — DX: Other chronic pain: G89.29

## 2011-02-04 HISTORY — DX: Dorsalgia, unspecified: M54.9

## 2011-02-04 MED ORDER — OXYCODONE-ACETAMINOPHEN 5-325 MG PO TABS
2.0000 | ORAL_TABLET | Freq: Once | ORAL | Status: AC
  Administered 2011-02-04: 2 via ORAL
  Filled 2011-02-04: qty 2

## 2011-02-04 MED ORDER — OXYCODONE-ACETAMINOPHEN 5-325 MG PO TABS: 1.0000 | ORAL_TABLET | ORAL | Status: AC | PRN

## 2011-02-04 NOTE — ED Provider Notes (Signed)
History     CSN: [id number] Arrival date & time: 02/04/2011 11:59 AM  Chief Complaint  Patient presents with  . Back Pain   Patient is a 40 y.o. male presenting with back pain. The history is provided by the patient.  Back Pain  This is a recurrent problem. The current episode started more than 2 days ago. The problem occurs constantly. The problem has not changed since onset.The pain is associated with no known injury. The pain is present in the lumbar spine. The quality of the pain is described as aching and stabbing. The pain radiates to the left thigh. The pain is severe. The symptoms are aggravated by certain positions and bending. The pain is the same all the time. Associated symptoms include leg pain. Pertinent negatives include no chest pain, no fever, no numbness, no headaches, no abdominal pain, no bowel incontinence, no perianal numbness, no bladder incontinence, no paresthesias, no paresis, no tingling and no weakness. He has tried NSAIDs and ice for the symptoms. The treatment provided mild relief. Risk factors include obesity.    Past Medical History  Diagnosis Date  . Hypertension   . Diabetes mellitus   . Sleep apnea   . Abscess   . Chronic back pain     History reviewed. No pertinent past surgical history.  History reviewed. No pertinent family history.  History  Substance Use Topics  . Smoking status: Current Everyday Smoker -- 0.5 packs/day    Types: Cigarettes  . Smokeless tobacco: Not on file  . Alcohol Use: No      Review of Systems  Constitutional: Negative for fever.  HENT: Negative for congestion, sore throat and neck pain.   Eyes: Negative.   Respiratory: Negative for chest tightness and shortness of breath.   Cardiovascular: Negative for chest pain.  Gastrointestinal: Negative for nausea, abdominal pain and bowel incontinence.  Genitourinary: Negative.  Negative for bladder incontinence.  Musculoskeletal: Positive for back pain. Negative for  joint swelling, arthralgias and gait problem.  Skin: Negative.  Negative for rash and wound.  Neurological: Negative for dizziness, tingling, weakness, light-headedness, numbness, headaches and paresthesias.  Hematological: Negative.   Psychiatric/Behavioral: Negative.     Physical Exam  BP 115/73  Pulse 84  Temp(Src) 97.6 F (36.4 C) (Oral)  Resp 20  Ht 6\' 1"  (1.854 m)  Wt 356 lb (161.481 kg)  BMI 46.97 kg/m2  SpO2 96%  Physical Exam  Constitutional: He is oriented to person, place, and time. He appears well-developed and well-nourished.       Morbidly obese  HENT:  Head: Normocephalic.  Eyes: Conjunctivae are normal.  Neck: Normal range of motion. Neck supple.  Cardiovascular: Regular rhythm and intact distal pulses.        Pedal pulses normal.  Pulmonary/Chest: Effort normal. He has no wheezes.  Abdominal: Soft. Bowel sounds are normal. He exhibits no distension and no mass.  Musculoskeletal: Normal range of motion. He exhibits no edema.       Lumbar back: He exhibits tenderness. He exhibits no swelling, no edema and no spasm.       Pain is left paralumbar which radiates down left posterior mid thigh.  Neurological: He is alert and oriented to person, place, and time. He has normal strength. He displays no atrophy and no tremor. No cranial nerve deficit or sensory deficit. Gait normal. GCS eye subscore is 4. GCS verbal subscore is 5. GCS motor subscore is 6.  Reflex Scores:      Patellar  reflexes are 2+ on the right side and 2+ on the left side.      Achilles reflexes are 2+ on the right side and 2+ on the left side.      No strength deficit noted in hip and knee flexor and extensor muscle groups.  Ankle flexion and extension intact.  Skin: Skin is warm and dry.  Psychiatric: He has a normal mood and affect.    ED Course  Procedures  MDM Chronic lumbar pain with radiculopathy without neurologic findings.      Candis Musa, PA 02/04/11 1705

## 2011-02-04 NOTE — ED Notes (Signed)
Pt updated on delay

## 2011-02-04 NOTE — ED Notes (Signed)
C/o lower back pain radiating down his left leg x 3 days. Denies injury.

## 2011-02-05 NOTE — ED Provider Notes (Signed)
Medical screening examination/treatment/procedure(s) were performed by non-physician practitioner and as supervising physician I was immediately available for consultation/collaboration.  Juliet Rude. Rubin Payor, MD 02/05/11 1313

## 2011-02-17 ENCOUNTER — Emergency Department (HOSPITAL_COMMUNITY)
Admission: EM | Admit: 2011-02-17 | Discharge: 2011-02-17 | Disposition: A | Discharge status: Home / Self Care | Payer: Medicaid Other | Attending: Emergency Medicine | Admitting: Emergency Medicine

## 2011-02-17 ENCOUNTER — Encounter (HOSPITAL_COMMUNITY): Payer: Self-pay | Admitting: Emergency Medicine

## 2011-02-17 DIAGNOSIS — R21 Rash and other nonspecific skin eruption: Secondary | ICD-10-CM

## 2011-02-17 DIAGNOSIS — E119 Type 2 diabetes mellitus without complications: Secondary | ICD-10-CM | POA: Insufficient documentation

## 2011-02-17 DIAGNOSIS — I10 Essential (primary) hypertension: Secondary | ICD-10-CM | POA: Insufficient documentation

## 2011-02-17 DIAGNOSIS — S93409A Sprain of unspecified ligament of unspecified ankle, initial encounter: Secondary | ICD-10-CM

## 2011-02-17 DIAGNOSIS — F17200 Nicotine dependence, unspecified, uncomplicated: Secondary | ICD-10-CM | POA: Insufficient documentation

## 2011-02-17 DIAGNOSIS — X500XXA Overexertion from strenuous movement or load, initial encounter: Secondary | ICD-10-CM | POA: Insufficient documentation

## 2011-02-17 DIAGNOSIS — M549 Dorsalgia, unspecified: Secondary | ICD-10-CM | POA: Insufficient documentation

## 2011-02-17 MED ORDER — HYDROCODONE-ACETAMINOPHEN 5-325 MG PO TABS: ORAL_TABLET | ORAL | Status: DC

## 2011-02-17 MED ORDER — TRIAMCINOLONE ACETONIDE 0.1 % EX CREA: TOPICAL_CREAM | Freq: Two times a day (BID) | CUTANEOUS | Status: DC

## 2011-02-17 MED ORDER — HYDROCODONE-ACETAMINOPHEN 5-325 MG PO TABS
2.0000 | ORAL_TABLET | Freq: Once | ORAL | Status: AC
  Administered 2011-02-17: 2 via ORAL
  Filled 2011-02-17: qty 2

## 2011-02-17 MED ORDER — ONDANSETRON HCL 4 MG PO TABS
4.0000 mg | ORAL_TABLET | Freq: Once | ORAL | Status: AC
  Administered 2011-02-17: 4 mg via ORAL
  Filled 2011-02-17: qty 1

## 2011-02-17 MED ORDER — HYDROXYZINE PAMOATE 25 MG PO CAPS: ORAL_CAPSULE | ORAL | Status: DC

## 2011-02-17 NOTE — ED Notes (Signed)
Patient with no complaints at this time. Respirations even and unlabored. Skin warm/dry. Discharge instructions reviewed with patient at this time. Patient given opportunity to voice concerns/ask questions. Patient discharged at this time and left Emergency Department with steady gait.   

## 2011-02-17 NOTE — ED Provider Notes (Signed)
History     CSN: [id number] Arrival date & time: 02/17/2011 11:07 AM  Chief Complaint  Patient presents with  . Rash   Patient is a 40 y.o. male presenting with rash. The history is provided by the patient.  Rash  This is a new problem. The current episode started more than 1 week ago. The problem has not changed since onset.The problem is associated with an unknown factor. There has been no fever. The rash is present on the right lower leg. The patient is experiencing no pain. Associated symptoms include itching. He has tried antibiotic cream for the symptoms. The treatment provided no relief.    Past Medical History  Diagnosis Date  . Hypertension   . Diabetes mellitus   . Sleep apnea   . Abscess   . Chronic back pain     History reviewed. No pertinent past surgical history.  No family history on file.  History  Substance Use Topics  . Smoking status: Current Everyday Smoker -- 0.5 packs/day    Types: Cigarettes  . Smokeless tobacco: Not on file  . Alcohol Use: No      Review of Systems  Constitutional: Negative for activity change.       All ROS Neg except as noted in HPI  HENT: Negative for nosebleeds and neck pain.   Eyes: Negative for photophobia and discharge.  Respiratory: Negative for cough, shortness of breath and wheezing.   Cardiovascular: Negative for chest pain and palpitations.  Gastrointestinal: Negative for abdominal pain and blood in stool.  Genitourinary: Negative for dysuria, frequency and hematuria.  Musculoskeletal: Negative for back pain and arthralgias.  Skin: Positive for itching and rash.  Neurological: Negative for dizziness, seizures and speech difficulty.  Psychiatric/Behavioral: Negative for hallucinations and confusion.    Physical Exam  BP 140/80  Pulse 88  Temp(Src) 98.4 F (36.9 C) (Oral)  Resp 20  Ht 6\' 1"  (1.854 m)  Wt 354 lb (160.573 kg)  BMI 46.70 kg/m2  SpO2 99%  Physical Exam  Nursing note and vitals  reviewed. Constitutional: He is oriented to person, place, and time. He appears well-developed and well-nourished.  Non-toxic appearance.  HENT:  Head: Normocephalic.  Right Ear: Tympanic membrane and external ear normal.  Left Ear: Tympanic membrane and external ear normal.  Eyes: EOM and lids are normal. Pupils are equal, round, and reactive to light.  Neck: Normal range of motion. Neck supple. Carotid bruit is not present.  Cardiovascular: Normal rate, regular rhythm, normal heart sounds, intact distal pulses and normal pulses.   Pulmonary/Chest: Breath sounds normal. No respiratory distress.  Abdominal: Soft. Bowel sounds are normal. There is no tenderness. There is no guarding.  Musculoskeletal: Normal range of motion.       Swelling of the left  lateral malleolus. FROM of the toes an Achilles. Good cap refill. Multiple bite like lesions of the right lower leg and ankle. No drainage. No red streaking. Not hot. Not swollen.  Lymphadenopathy:       Head (right side): No submandibular adenopathy present.       Head (left side): No submandibular adenopathy present.    He has no cervical adenopathy.  Neurological: He is alert and oriented to person, place, and time. He has normal strength. No cranial nerve deficit or sensory deficit.  Skin: Skin is warm and dry.  Psychiatric: He has a normal mood and affect. His speech is normal.    ED Course; Discussed need for close diabetic management, and  to request evaluation and possible vascular evaluation of the lower ext. Due to pt's concerns about lack of hair on the lower ext and darker pigment of the left calf.  Procedures  MDM I have reviewed nursing notes, vital signs, and all appropriate lab and imaging results for this patient.      Kathie Dike, [place] 02/17/11 1233

## 2011-02-17 NOTE — ED Notes (Signed)
Patient with report of rash to right ankle x 5 days. Red raised bumps noted. +itching. Patient also c/o left ankle pain "I twisted it yesterday"

## 2011-02-18 NOTE — ED Provider Notes (Signed)
Medical screening examination/treatment/procedure(s) were performed by non-physician practitioner and as supervising physician I was immediately available for consultation/collaboration.   [name], MD 02/18/11 1049

## 2011-03-18 DIAGNOSIS — I10 Essential (primary) hypertension: Secondary | ICD-10-CM | POA: Insufficient documentation

## 2011-03-18 DIAGNOSIS — Z91199 Patient's noncompliance with other medical treatment and regimen due to unspecified reason: Secondary | ICD-10-CM | POA: Insufficient documentation

## 2011-03-18 DIAGNOSIS — E119 Type 2 diabetes mellitus without complications: Secondary | ICD-10-CM | POA: Insufficient documentation

## 2011-03-18 DIAGNOSIS — Z9119 Patient's noncompliance with other medical treatment and regimen: Secondary | ICD-10-CM | POA: Insufficient documentation

## 2011-03-18 DIAGNOSIS — F17200 Nicotine dependence, unspecified, uncomplicated: Secondary | ICD-10-CM | POA: Insufficient documentation

## 2011-03-18 DIAGNOSIS — M545 Low back pain, unspecified: Secondary | ICD-10-CM | POA: Insufficient documentation

## 2011-03-18 DIAGNOSIS — Z79899 Other long term (current) drug therapy: Secondary | ICD-10-CM | POA: Insufficient documentation

## 2011-03-18 DIAGNOSIS — R1084 Generalized abdominal pain: Secondary | ICD-10-CM | POA: Insufficient documentation

## 2011-03-19 ENCOUNTER — Emergency Department (HOSPITAL_COMMUNITY): Payer: Medicaid Other

## 2011-03-19 ENCOUNTER — Emergency Department (HOSPITAL_COMMUNITY)
Admission: EM | Admit: 2011-03-19 | Discharge: 2011-03-19 | Disposition: A | Discharge status: Home / Self Care | Payer: Medicaid Other | Attending: Emergency Medicine | Admitting: Emergency Medicine

## 2011-03-19 ENCOUNTER — Encounter (HOSPITAL_COMMUNITY): Payer: Self-pay | Admitting: *Deleted

## 2011-03-19 DIAGNOSIS — R739 Hyperglycemia, unspecified: Secondary | ICD-10-CM

## 2011-03-19 DIAGNOSIS — M549 Dorsalgia, unspecified: Secondary | ICD-10-CM

## 2011-03-19 LAB — CBC
HCT: 46.3 % (ref 39.0–52.0)
Hemoglobin: 15.2 g/dL (ref 13.0–17.0)
MCH: 28 pg (ref 26.0–34.0)
MCHC: 32.8 g/dL (ref 30.0–36.0)
MCV: 85.4 fL (ref 78.0–100.0)

## 2011-03-19 LAB — DIFFERENTIAL
Basophils Relative: 0 % (ref 0–1)
Eosinophils Relative: 2 % (ref 0–5)
Monocytes Absolute: 0.6 10*3/uL (ref 0.1–1.0)
Monocytes Relative: 7 % (ref 3–12)
Neutro Abs: 4 10*3/uL (ref 1.7–7.7)

## 2011-03-19 LAB — BASIC METABOLIC PANEL
BUN: 13 mg/dL (ref 6–23)
Chloride: 97 mEq/L (ref 96–112)
Creatinine, Ser: 0.89 mg/dL (ref 0.50–1.35)
GFR calc Af Amer: >60 mL/min (ref >60)

## 2011-03-19 LAB — URINE MICROSCOPIC-ADD ON

## 2011-03-19 LAB — URINALYSIS, ROUTINE W REFLEX MICROSCOPIC
Glucose, UA: >1000 mg/dL — AB
Ketones, ur: NEGATIVE mg/dL
Leukocytes, UA: NEGATIVE
Specific Gravity, Urine: 1.01 (ref 1.005–1.030)
pH: 6.5 (ref 5.0–8.0)

## 2011-03-19 MED ORDER — MORPHINE SULFATE 4 MG/ML IJ SOLN
4.0000 mg | Freq: Once | INTRAMUSCULAR | Status: AC
  Administered 2011-03-19: 4 mg via INTRAVENOUS
  Filled 2011-03-19: qty 1

## 2011-03-19 MED ORDER — SODIUM CHLORIDE 0.9 % IV BOLUS (SEPSIS): 1000.0000 mL | Freq: Once | INTRAVENOUS | Status: DC

## 2011-03-19 MED ORDER — PANTOPRAZOLE SODIUM 40 MG IV SOLR
INTRAVENOUS | Status: AC
  Filled 2011-03-19: qty 40

## 2011-03-19 MED ORDER — HYDROCODONE-ACETAMINOPHEN 5-325 MG PO TABS: 2.0000 | ORAL_TABLET | ORAL | Status: AC | PRN

## 2011-03-19 MED ORDER — DOXYCYCLINE HYCLATE 100 MG PO CAPS: 100.0000 mg | ORAL_CAPSULE | Freq: Two times a day (BID) | ORAL | Status: AC

## 2011-03-19 MED ORDER — PANTOPRAZOLE SODIUM 40 MG IV SOLR
40.0000 mg | Freq: Once | INTRAVENOUS | Status: AC
  Administered 2011-03-19: 40 mg via INTRAVENOUS

## 2011-03-19 MED ORDER — PROMETHAZINE HCL 25 MG PO TABS: 12.5000 mg | ORAL_TABLET | Freq: Four times a day (QID) | ORAL | Status: AC | PRN

## 2011-03-19 MED ORDER — SODIUM CHLORIDE 0.9 % IV SOLN: Freq: Once | INTRAVENOUS | Status: DC

## 2011-03-19 MED ORDER — HYDROCODONE-ACETAMINOPHEN 5-325 MG PO TABS: 1.0000 | ORAL_TABLET | ORAL | Status: AC | PRN

## 2011-03-19 MED ORDER — ONDANSETRON HCL 4 MG/2ML IJ SOLN
4.0000 mg | Freq: Once | INTRAMUSCULAR | Status: AC
  Administered 2011-03-19: 4 mg via INTRAVENOUS
  Filled 2011-03-19: qty 2

## 2011-03-19 MED ORDER — INSULIN ASPART 100 UNIT/ML ~~LOC~~ SOLN
10.0000 [IU] | Freq: Once | SUBCUTANEOUS | Status: AC
  Administered 2011-03-19: 03:00:00 via INTRAVENOUS

## 2011-03-19 NOTE — ED Provider Notes (Addendum)
History     CSN: [id number] Arrival date & time: 03/19/2011 12:27 AM  Chief Complaint  Patient presents with  . Back Pain  . Abdominal Pain    (Consider location/radiation/quality/duration/timing/severity/associated sxs/prior treatment) HPI Comments: Examined at 6. Patient with lower back pain that now also includes generalized abdominal pain. States lower back pain began with no known injury. Pain worse with movement. Has not taken any OTC medicines to relieve it. Developed generalized abdominal cramping yesterday. Had a large bowel movement yesterday with no relief of the abdominal pain.Denies nausea, fever, chills, vomiting.   Patient is a 40 y.o. male presenting with back pain and abdominal pain. The history is provided by the patient.  Back Pain  This is a new problem. The current episode started 2 days ago. The problem occurs constantly. The problem has not changed since onset.The pain is associated with no known injury. The pain is present in the lumbar spine. The quality of the pain is described as aching. The pain does not radiate. The pain is at a severity of 7/10. The pain is moderate. The symptoms are aggravated by bending and certain positions. Associated symptoms include abdominal pain. Pertinent negatives include no chest pain, no fever, no numbness, no bowel incontinence, no dysuria, no leg pain, no paresthesias, no tingling and no weakness. He has tried nothing for the symptoms. Risk factors include obesity and a sedentary lifestyle.  Abdominal Pain The primary symptoms of the illness include abdominal pain. The primary symptoms of the illness do not include fever or dysuria.  Additional symptoms associated with the illness include back pain.    Past Medical History  Diagnosis Date  . Hypertension   . Diabetes mellitus   . Sleep apnea   . Abscess   . Chronic back pain     History reviewed. No pertinent past surgical history.  Family History  Problem Relation Age  of Onset  . Hypertension Mother   . Diabetes Mother   . Hypertension Father   . Diabetes Father   . Diabetes Brother   . Hypertension Brother     History  Substance Use Topics  . Smoking status: Current Everyday Smoker -- 0.5 packs/day    Types: Cigarettes  . Smokeless tobacco: Not on file  . Alcohol Use: No      Review of Systems  Constitutional: Negative for fever.  Cardiovascular: Negative for chest pain.  Gastrointestinal: Positive for abdominal pain. Negative for bowel incontinence.  Genitourinary: Negative for dysuria.  Musculoskeletal: Positive for back pain.  Neurological: Negative for tingling, weakness, numbness and paresthesias.  All other systems reviewed and are negative.    Allergies  Sulfa antibiotics  Home Medications   Current Outpatient Rx  Name Route Sig Dispense Refill  . ACETAMINOPHEN 500 MG PO TABS Oral Take 1,000 mg by mouth 2 (two) times daily as needed. For pain     . AMLODIPINE BESYLATE 10 MG PO TABS Oral Take 10 mg by mouth daily.      Marland Kitchen GLIPIZIDE 10 MG PO TABS Oral Take 10 mg by mouth 2 (two) times daily before a meal.      . HYDROCHLOROTHIAZIDE 25 MG PO TABS Oral Take 25 mg by mouth daily.      Marland Kitchen METFORMIN HCL 1000 MG (OSM) PO TB24 Oral Take 1,000 mg by mouth 2 (two) times daily before a meal.     . METFORMIN HCL 1000 MG PO TABS Oral Take 1,000 mg by mouth 2 (two) times daily with  a meal.      . PRAVASTATIN SODIUM 20 MG PO TABS Oral Take 20 mg by mouth daily.      Marland Kitchen SITAGLIPTIN PHOSPHATE 25 MG PO TABS Oral Take 25 mg by mouth daily.        BP 132/72  Pulse 75  Temp 97.5 F (36.4 C)  Resp 20  Ht 6\' 1"  (1.854 m)  Wt 352 lb (159.666 kg)  BMI 46.44 kg/m2  SpO2 94%  Physical Exam  Constitutional: He is oriented to person, place, and time. He appears well-developed and well-nourished. No distress.       Morbidly obese  HENT:  Head: Normocephalic.  Right Ear: External ear normal.  Left Ear: External ear normal.  Mouth/Throat:  Oropharynx is clear and moist.  Eyes: EOM are normal.  Neck: Normal range of motion. Neck supple.  Cardiovascular: Normal rate and intact distal pulses.   Pulmonary/Chest: Effort normal and breath sounds normal.  Abdominal: Soft. Bowel sounds are normal. He exhibits no distension and no mass. There is tenderness. There is no rebound and no guarding.       Obese, unable to appreciate organs.mild tenderness, generalized, no focal area of discomfort.  Musculoskeletal: Normal range of motion.  Neurological: He is alert and oriented to person, place, and time.  Skin: Skin is warm and dry.       Small areas of inflammation in both axilla c/w early abscesses.    ED Course  Procedures (including critical care time)  Labs Reviewed  URINALYSIS, ROUTINE W REFLEX MICROSCOPIC - Abnormal; Notable for the following:    Glucose, UA >1000 (*)    All other components within normal limits  GLUCOSE, CAPILLARY - Abnormal; Notable for the following:    Glucose-Capillary 416 (*)    All other components within normal limits  BASIC METABOLIC PANEL - Abnormal; Notable for the following:    Sodium 133 (*)    Potassium 3.4 (*)    Glucose, Bld 353 (*)    All other components within normal limits  GLUCOSE, CAPILLARY - Abnormal; Notable for the following:    Glucose-Capillary 277 (*)    All other components within normal limits  URINE MICROSCOPIC-ADD ON  CBC  DIFFERENTIAL  POCT CBG MONITORING   Dg Abd Acute W/chest  03/19/2011  *RADIOLOGY REPORT*  Clinical Data: Abdominal pain and low back pain  ACUTE ABDOMEN SERIES (ABDOMEN 2 VIEW & CHEST 1 VIEW)  Comparison: Chest 09/20/2010  Findings: Normal heart size and pulmonary vascularity.  No focal consolidation in the lungs.  No blunting of costophrenic angles. No significant changes since the previous chest film.  Scattered gas and stool in the colon.  No small or large bowel dilatation.  No free intra-abdominal air.  No abnormal air fluid levels.  No radiopaque  stones.  IMPRESSION: No evidence of active pulmonary disease.  Nonobstructive bowel gas pattern.  Original Report Authenticated By: Marlon Pel, M.D.     Patient with presentation of back pain and abdominal pain. Morbidly obese, diabetic in poor diabetic control. Elevated glucose on oral agents and medical noncompliance. Patient given IVF, insulin with improvement in glucose. Analgesics and antiemetics administered with relief of pain. xrays with no acute findings but large stool burden. Reviewed results with patient. Pt stable in ED with no significant deterioration in condition.Pt feels improved after observation and/or treatment in ED. MDM Reviewed: nursing note and vitals Interpretation: labs and x-ray Total time providing critical care: 45.  Nicoletta Dress. Colon Branch, MD 03/19/11 0454  Nicoletta Dress. Colon Branch, MD 03/19/11 [phone]

## 2011-03-19 NOTE — ED Notes (Signed)
Pt in room drinking coke

## 2011-03-19 NOTE — ED Notes (Signed)
Pt c/o lower back pain and abdominal pain.

## 2011-03-19 NOTE — ED Notes (Signed)
Pt c/o having dizzy spells off and on as well.

## 2011-03-30 ENCOUNTER — Other Ambulatory Visit (HOSPITAL_COMMUNITY): Payer: Self-pay | Admitting: Specialist

## 2011-03-30 DIAGNOSIS — M549 Dorsalgia, unspecified: Secondary | ICD-10-CM

## 2011-04-03 ENCOUNTER — Ambulatory Visit (HOSPITAL_COMMUNITY): Payer: Medicaid Other

## 2011-06-21 ENCOUNTER — Emergency Department (HOSPITAL_COMMUNITY)
Admission: EM | Admit: 2011-06-21 | Discharge: 2011-06-21 | Disposition: A | Discharge status: Home / Self Care | Payer: Medicaid Other | Attending: Emergency Medicine | Admitting: Emergency Medicine

## 2011-06-21 ENCOUNTER — Encounter (HOSPITAL_COMMUNITY): Payer: Self-pay | Admitting: Emergency Medicine

## 2011-06-21 DIAGNOSIS — M79609 Pain in unspecified limb: Secondary | ICD-10-CM | POA: Insufficient documentation

## 2011-06-21 DIAGNOSIS — F17200 Nicotine dependence, unspecified, uncomplicated: Secondary | ICD-10-CM | POA: Insufficient documentation

## 2011-06-21 DIAGNOSIS — M5416 Radiculopathy, lumbar region: Secondary | ICD-10-CM

## 2011-06-21 DIAGNOSIS — IMO0002 Reserved for concepts with insufficient information to code with codable children: Secondary | ICD-10-CM | POA: Insufficient documentation

## 2011-06-21 DIAGNOSIS — I10 Essential (primary) hypertension: Secondary | ICD-10-CM | POA: Insufficient documentation

## 2011-06-21 DIAGNOSIS — Z79899 Other long term (current) drug therapy: Secondary | ICD-10-CM | POA: Insufficient documentation

## 2011-06-21 DIAGNOSIS — IMO0001 Reserved for inherently not codable concepts without codable children: Secondary | ICD-10-CM | POA: Insufficient documentation

## 2011-06-21 DIAGNOSIS — M545 Low back pain, unspecified: Secondary | ICD-10-CM | POA: Insufficient documentation

## 2011-06-21 DIAGNOSIS — E119 Type 2 diabetes mellitus without complications: Secondary | ICD-10-CM | POA: Insufficient documentation

## 2011-06-21 HISTORY — DX: Sciatica, unspecified side: M54.30

## 2011-06-21 MED ORDER — KETOROLAC TROMETHAMINE 60 MG/2ML IM SOLN
60.0000 mg | Freq: Once | INTRAMUSCULAR | Status: AC
  Administered 2011-06-21: 60 mg via INTRAMUSCULAR
  Filled 2011-06-21: qty 2

## 2011-06-21 MED ORDER — HYDROCODONE-ACETAMINOPHEN 5-325 MG PO TABS
2.0000 | ORAL_TABLET | Freq: Once | ORAL | Status: AC
  Administered 2011-06-21: 2 via ORAL
  Filled 2011-06-21: qty 2

## 2011-06-21 MED ORDER — HYDROCODONE-ACETAMINOPHEN 10-325 MG PO TABS: 1.0000 | ORAL_TABLET | Freq: Four times a day (QID) | ORAL | Status: DC | PRN

## 2011-06-21 NOTE — ED Notes (Signed)
Patient c/o right leg pain that starts in his right groin and moves down his leg.

## 2011-06-21 NOTE — ED Provider Notes (Signed)
History     CSN: [id number]  Arrival date & time 06/21/11  9147   First MD Initiated Contact with Patient 06/21/11 0425      Chief Complaint  Patient presents with  . Leg Pain    (Consider location/radiation/quality/duration/timing/severity/associated sxs/prior treatment) HPI This is a 41 year old black male who complains of a one-day history of pain that begins in his right lower back, specifically about the buttock, and radiates around the right lower leg laterally to medially at about the L3 or L4 dermatome. The pain is moderate to severe and worse with movement. He denies acute numbness or weakness, but has difficulty raising his right leg at the hip due to pain. He has a history of this in the past that was accompanied by muscle weakness but states that this episode is not as severe. He denies bowel or bladder changes. He denies saddle anesthesia. He is able to ambulate. He denies recent injury.  Past Medical History  Diagnosis Date  . Hypertension   . Diabetes mellitus   . Sleep apnea   . Abscess   . Chronic back pain   . Sciatica     History reviewed. No pertinent past surgical history.  Family History  Problem Relation Age of Onset  . Hypertension Mother   . Diabetes Mother   . Hypertension Father   . Diabetes Father   . Diabetes Brother   . Hypertension Brother     History  Substance Use Topics  . Smoking status: Current Everyday Smoker -- 0.5 packs/day    Types: Cigarettes  . Smokeless tobacco: Not on file  . Alcohol Use: No      Review of Systems  All other systems reviewed and are negative.    Allergies  Sulfa antibiotics  Home Medications   Current Outpatient Rx  Name Route Sig Dispense Refill  . ACETAMINOPHEN 500 MG PO TABS Oral Take 1,000 mg by mouth 2 (two) times daily as needed. For pain     . AMLODIPINE BESYLATE 10 MG PO TABS Oral Take 10 mg by mouth daily.      Marland Kitchen GLIPIZIDE 10 MG PO TABS Oral Take 10 mg by mouth 2 (two) times daily  before a meal.      . HYDROCHLOROTHIAZIDE 25 MG PO TABS Oral Take 25 mg by mouth daily.      Marland Kitchen METFORMIN HCL ER (OSM) 1000 MG PO TB24 Oral Take 1,000 mg by mouth 2 (two) times daily before a meal.     . PRAVASTATIN SODIUM 20 MG PO TABS Oral Take 20 mg by mouth daily.      Marland Kitchen SITAGLIPTIN PHOSPHATE 25 MG PO TABS Oral Take 25 mg by mouth daily.      Marland Kitchen METFORMIN HCL 1000 MG PO TABS Oral Take 1,000 mg by mouth 2 (two) times daily with a meal.        BP 106/71  Pulse 99  Temp(Src) 98.3 F (36.8 C) (Oral)  Resp 18  Ht 6\' 1"  (1.854 m)  Wt 340 lb (154.223 kg)  BMI 44.86 kg/m2  SpO2 97%  Physical Exam General: Well-developed, well-nourished male in no acute distress; appearance consistent with age of record HENT: normocephalic, atraumatic Eyes: normal per Neck: supple Heart: regular rate and rhythm Lungs: clear to auscultation bilaterally Abdomen: soft; nondistended Extremities: No deformity; full range of motion; trace edema of lower legs; normal range of motion of left hip; limited range of motion at right hip due to pain Neurologic: Awake, alert  and oriented; motor function intact in all extremities although evaluation of strength at right hip limited due to pain; sensation intact and symmetric; no facial droop Skin: Warm and dry Psychiatric: Normal mood and affect    ED Course  Procedures (including critical care time)  Will avoid steroids due to the patient's diabetes. He will follow up with his primary care physician, Dr. Mayford Knife in [place].   MDM          Hanley Seamen, MD 06/21/11 [phone]

## 2011-06-21 NOTE — ED Notes (Signed)
Escorted to [facility] and friend transporting home.  To follow-up with his physician as directed

## 2011-06-27 ENCOUNTER — Encounter (HOSPITAL_COMMUNITY): Payer: Self-pay

## 2011-06-27 ENCOUNTER — Emergency Department (HOSPITAL_COMMUNITY)
Admission: EM | Admit: 2011-06-27 | Discharge: 2011-06-27 | Disposition: A | Discharge status: Home / Self Care | Payer: Medicaid Other | Attending: Emergency Medicine | Admitting: Emergency Medicine

## 2011-06-27 ENCOUNTER — Emergency Department (HOSPITAL_COMMUNITY): Payer: Medicaid Other

## 2011-06-27 DIAGNOSIS — J111 Influenza due to unidentified influenza virus with other respiratory manifestations: Secondary | ICD-10-CM | POA: Insufficient documentation

## 2011-06-27 DIAGNOSIS — IMO0001 Reserved for inherently not codable concepts without codable children: Secondary | ICD-10-CM | POA: Insufficient documentation

## 2011-06-27 DIAGNOSIS — Z79899 Other long term (current) drug therapy: Secondary | ICD-10-CM | POA: Insufficient documentation

## 2011-06-27 DIAGNOSIS — I10 Essential (primary) hypertension: Secondary | ICD-10-CM | POA: Insufficient documentation

## 2011-06-27 DIAGNOSIS — R51 Headache: Secondary | ICD-10-CM | POA: Insufficient documentation

## 2011-06-27 DIAGNOSIS — J45909 Unspecified asthma, uncomplicated: Secondary | ICD-10-CM | POA: Insufficient documentation

## 2011-06-27 DIAGNOSIS — R61 Generalized hyperhidrosis: Secondary | ICD-10-CM | POA: Insufficient documentation

## 2011-06-27 DIAGNOSIS — R05 Cough: Secondary | ICD-10-CM | POA: Insufficient documentation

## 2011-06-27 DIAGNOSIS — R6883 Chills (without fever): Secondary | ICD-10-CM | POA: Insufficient documentation

## 2011-06-27 DIAGNOSIS — F17200 Nicotine dependence, unspecified, uncomplicated: Secondary | ICD-10-CM | POA: Insufficient documentation

## 2011-06-27 DIAGNOSIS — R059 Cough, unspecified: Secondary | ICD-10-CM | POA: Insufficient documentation

## 2011-06-27 DIAGNOSIS — E119 Type 2 diabetes mellitus without complications: Secondary | ICD-10-CM | POA: Insufficient documentation

## 2011-06-27 DIAGNOSIS — R5381 Other malaise: Secondary | ICD-10-CM | POA: Insufficient documentation

## 2011-06-27 HISTORY — DX: Bronchitis, not specified as acute or chronic: J40

## 2011-06-27 MED ORDER — OSELTAMIVIR PHOSPHATE 75 MG PO CAPS: 75.0000 mg | ORAL_CAPSULE | Freq: Two times a day (BID) | ORAL | Status: AC

## 2011-06-27 NOTE — ED Notes (Signed)
Pt reports cough, sore throat, diaphoresis, and body aches for the last two days.  Pt also reports some headache.  Pt reports "my family is getting over the flu".

## 2011-06-28 NOTE — ED Provider Notes (Signed)
Medical screening examination/treatment/procedure(s) were performed by non-physician practitioner and as supervising physician I was immediately available for consultation/collaboration.  Mira Balon P Jeraline Marcinek, MD 06/28/11 0710 

## 2011-06-28 NOTE — ED Provider Notes (Signed)
History     CSN: [id number]  Arrival date & time 06/27/11  1000   First MD Initiated Contact with Patient 06/27/11 1013      Chief Complaint  Patient presents with  . Cough  . Headache  . Chills    (Consider location/radiation/quality/duration/timing/severity/associated sxs/prior treatment) Patient is a 41 y.o. male presenting with flu symptoms. The history is provided by the patient.  Influenza This is a new problem. Episode onset: 2 days ago. The problem occurs constantly. The problem has been unchanged. Associated symptoms include chills, coughing, diaphoresis, fatigue, headaches, myalgias and a sore throat. Pertinent negatives include no abdominal pain, arthralgias, chest pain, congestion, fever, joint swelling, nausea, neck pain, numbness, rash, urinary symptoms, vomiting or weakness. The symptoms are aggravated by nothing. He has tried acetaminophen for the symptoms. The treatment provided no relief.    Past Medical History  Diagnosis Date  . Hypertension   . Diabetes mellitus   . Sleep apnea   . Abscess   . Chronic back pain   . Sciatica   . Asthma   . Bronchitis     History reviewed. No pertinent past surgical history.  Family History  Problem Relation Age of Onset  . Hypertension Mother   . Diabetes Mother   . Hypertension Father   . Diabetes Father   . Diabetes Brother   . Hypertension Brother     History  Substance Use Topics  . Smoking status: Current Everyday Smoker -- 0.5 packs/day    Types: Cigarettes  . Smokeless tobacco: Not on file  . Alcohol Use: No      Review of Systems  Constitutional: Positive for chills, diaphoresis and fatigue. Negative for fever.  HENT: Positive for sore throat. Negative for congestion and neck pain.   Eyes: Negative.   Respiratory: Positive for cough. Negative for chest tightness and shortness of breath.   Cardiovascular: Negative for chest pain.  Gastrointestinal: Negative for nausea, vomiting and abdominal  pain.  Genitourinary: Negative.   Musculoskeletal: Positive for myalgias. Negative for joint swelling and arthralgias.  Skin: Negative.  Negative for rash and wound.  Neurological: Positive for headaches. Negative for dizziness, weakness, light-headedness and numbness.  Hematological: Negative.   Psychiatric/Behavioral: Negative.     Allergies  Sulfa antibiotics  Home Medications   Current Outpatient Rx  Name Route Sig Dispense Refill  . AMLODIPINE BESYLATE 10 MG PO TABS Oral Take 10 mg by mouth daily.      Marland Kitchen GLIPIZIDE 10 MG PO TABS Oral Take 10 mg by mouth 2 (two) times daily before a meal.      . HYDROCHLOROTHIAZIDE 25 MG PO TABS Oral Take 25 mg by mouth daily.      Marland Kitchen METFORMIN HCL 1000 MG PO TABS Oral Take 1,000 mg by mouth 2 (two) times daily with a meal.      . PRAVASTATIN SODIUM 20 MG PO TABS Oral Take 20 mg by mouth every evening.     Marland Kitchen SITAGLIPTIN PHOSPHATE 25 MG PO TABS Oral Take 25 mg by mouth daily.      . OSELTAMIVIR PHOSPHATE 75 MG PO CAPS Oral Take 1 capsule (75 mg total) by mouth 2 (two) times daily. 10 capsule 0    BP 136/87  Pulse 85  Temp(Src) 97.9 F (36.6 C) (Oral)  Resp 18  Ht 6\' 1"  (1.854 m)  Wt 359 lb (162.841 kg)  BMI 47.36 kg/m2  SpO2 98%  Physical Exam  Nursing note and vitals reviewed. Constitutional: He  is oriented to person, place, and time. He appears well-developed and well-nourished.  HENT:  Head: Normocephalic and atraumatic.  Eyes: Conjunctivae are normal.  Neck: Normal range of motion.  Cardiovascular: Normal rate, regular rhythm, normal heart sounds and intact distal pulses.   Pulmonary/Chest: Effort normal. He has no decreased breath sounds. He has no wheezes. He exhibits no tenderness.       Frequent cough,  Coarse breath sounds without rhonchi or wheeze   Abdominal: Soft. Bowel sounds are normal. There is no tenderness.  Musculoskeletal: Normal range of motion.  Neurological: He is alert and oriented to person, place, and time.    Skin: Skin is warm and dry.  Psychiatric: He has a normal mood and affect.    ED Course  Procedures (including critical care time)   Labs Reviewed  RAPID STREP SCREEN   Dg Chest 2 View  06/27/2011  *RADIOLOGY REPORT*  Clinical Data: Cough, fever, smoker.  CHEST - 2 VIEW  Comparison: 04/21/2011  Findings: Mild peribronchial thickening. Heart and mediastinal contours are within normal limits.  No focal opacities or effusions.  No acute bony abnormality.  IMPRESSION: Mild bronchitic changes.  Original Report Authenticated By: Cyndie Chime, M.D.     1. Influenza       MDM  Tamiflu.  Patient in no distress at dc.  Rest,  Fluids.        Candis Musa, PA 06/28/11 [phone]

## 2011-08-25 ENCOUNTER — Encounter (HOSPITAL_COMMUNITY): Payer: Self-pay

## 2011-08-25 DIAGNOSIS — G8929 Other chronic pain: Secondary | ICD-10-CM | POA: Insufficient documentation

## 2011-08-25 DIAGNOSIS — W1809XA Striking against other object with subsequent fall, initial encounter: Secondary | ICD-10-CM | POA: Insufficient documentation

## 2011-08-25 DIAGNOSIS — E119 Type 2 diabetes mellitus without complications: Secondary | ICD-10-CM | POA: Insufficient documentation

## 2011-08-25 DIAGNOSIS — G4730 Sleep apnea, unspecified: Secondary | ICD-10-CM | POA: Insufficient documentation

## 2011-08-25 DIAGNOSIS — I10 Essential (primary) hypertension: Secondary | ICD-10-CM | POA: Insufficient documentation

## 2011-08-25 DIAGNOSIS — F17200 Nicotine dependence, unspecified, uncomplicated: Secondary | ICD-10-CM | POA: Insufficient documentation

## 2011-08-25 DIAGNOSIS — Y92009 Unspecified place in unspecified non-institutional (private) residence as the place of occurrence of the external cause: Secondary | ICD-10-CM | POA: Insufficient documentation

## 2011-08-25 DIAGNOSIS — M5430 Sciatica, unspecified side: Secondary | ICD-10-CM | POA: Insufficient documentation

## 2011-08-25 DIAGNOSIS — J45909 Unspecified asthma, uncomplicated: Secondary | ICD-10-CM | POA: Insufficient documentation

## 2011-08-25 DIAGNOSIS — IMO0002 Reserved for concepts with insufficient information to code with codable children: Secondary | ICD-10-CM | POA: Insufficient documentation

## 2011-08-25 NOTE — ED Notes (Signed)
Pt reports right scapular pain after slipping on water at home and hitting back on arm of couch. Pt states he is having a hard time raising his right arm.

## 2011-08-26 ENCOUNTER — Emergency Department (HOSPITAL_COMMUNITY)
Admission: EM | Admit: 2011-08-26 | Discharge: 2011-08-26 | Disposition: A | Discharge status: Home / Self Care | Payer: Medicaid Other | Attending: Emergency Medicine | Admitting: Emergency Medicine

## 2011-08-26 DIAGNOSIS — S3992XA Unspecified injury of lower back, initial encounter: Secondary | ICD-10-CM

## 2011-08-26 MED ORDER — KETOROLAC TROMETHAMINE 60 MG/2ML IM SOLN
60.0000 mg | Freq: Once | INTRAMUSCULAR | Status: AC
  Administered 2011-08-26: 60 mg via INTRAMUSCULAR
  Filled 2011-08-26: qty 2

## 2011-08-26 MED ORDER — TRAMADOL HCL 50 MG PO TABS: 50.0000 mg | ORAL_TABLET | Freq: Four times a day (QID) | ORAL | Status: AC | PRN

## 2011-08-26 NOTE — Discharge Instructions (Signed)
Use ibuprofen 600 mg every 6 hours for pain.  Use tramadol for more severe pain.  Followup with your Dr. As needed.

## 2011-08-26 NOTE — ED Notes (Signed)
Discharge instructions reviewed with pt, questions answered. Pt verbalized understanding.  

## 2011-08-26 NOTE — ED Provider Notes (Addendum)
History     CSN: [id number]  Arrival date & time 08/25/11  2219   First MD Initiated Contact with Patient 08/26/11 0032      Chief Complaint  Patient presents with  . Back Pain  . Fall    (Consider location/radiation/quality/duration/timing/severity/associated sxs/prior treatment) Patient is a 41 y.o. male presenting with back pain and fall. The history is provided by the patient and a parent.  Back Pain  Pertinent negatives include no weakness.  Fall  the patient is a 41 year old, male, who presents to the emergency department complaining of back pain.  He states that he slipped on water at home, and landed on the covered.  Her arm of a couch.  He complains of back pain.  Only.  He denies any other injuries.  He has not taken any medications for his pain.  Past Medical History  Diagnosis Date  . Hypertension   . Diabetes mellitus   . Sleep apnea   . Abscess   . Chronic back pain   . Sciatica   . Asthma   . Bronchitis     History reviewed. No pertinent past surgical history.  Family History  Problem Relation Age of Onset  . Hypertension Mother   . Diabetes Mother   . Hypertension Father   . Diabetes Father   . Diabetes Brother   . Hypertension Brother     History  Substance Use Topics  . Smoking status: Current Everyday Smoker -- 0.5 packs/day    Types: Cigarettes  . Smokeless tobacco: Not on file  . Alcohol Use: No      Review of Systems  Musculoskeletal: Positive for back pain.  Neurological: Negative for weakness.  Hematological: Does not bruise/bleed easily.  All other systems reviewed and are negative.    Allergies  Sulfa antibiotics  Home Medications   Current Outpatient Rx  Name Route Sig Dispense Refill  . AMLODIPINE BESYLATE 10 MG PO TABS Oral Take 10 mg by mouth daily.      Marland Kitchen GLIPIZIDE 10 MG PO TABS Oral Take 10 mg by mouth 2 (two) times daily before a meal.      . HYDROCHLOROTHIAZIDE 25 MG PO TABS Oral Take 25 mg by mouth daily.        Marland Kitchen METFORMIN HCL 1000 MG PO TABS Oral Take 1,000 mg by mouth 2 (two) times daily with a meal.      . PRAVASTATIN SODIUM 20 MG PO TABS Oral Take 20 mg by mouth every evening.     Marland Kitchen SITAGLIPTIN PHOSPHATE 25 MG PO TABS Oral Take 25 mg by mouth daily.      . TRAMADOL HCL 50 MG PO TABS Oral Take 1 tablet (50 mg total) by mouth every 6 (six) hours as needed for pain. 15 tablet 0    BP 120/61  Pulse 90  Temp(Src) 97.9 F (36.6 C) (Oral)  Resp 22  Ht 6\' 1"  (1.854 m)  Wt 360 lb (163.295 kg)  BMI 47.50 kg/m2  SpO2 95%  Physical Exam  Constitutional: He is oriented to person, place, and time. No distress.       Morbidly obese  HENT:  Head: Normocephalic and atraumatic.  Eyes: Conjunctivae are normal. Pupils are equal, round, and reactive to light.  Neck: Normal range of motion.  Pulmonary/Chest: Effort normal.  Musculoskeletal: Normal range of motion. He exhibits tenderness. He exhibits no edema.       Mild tenderness to the right of midline in the upper back,  with no ecchymoses, lacerations  Neurological: He is alert and oriented to person, place, and time.  Skin: Skin is warm and dry. No erythema.  Psychiatric: He has a normal mood and affect. Thought content normal.    ED Course  Procedures (including critical care time) Mild back injury.  No tests indicated  Labs Reviewed - No data to display No results found.   1. Back injury       MDM  Back injury Mild        Cheri Guppy, MD 08/26/11 1610  Cheri Guppy, MD 08/26/11 [phone]

## 2011-10-09 ENCOUNTER — Other Ambulatory Visit: Payer: Self-pay

## 2011-10-09 ENCOUNTER — Encounter (HOSPITAL_COMMUNITY): Payer: Self-pay | Admitting: *Deleted

## 2011-10-09 ENCOUNTER — Emergency Department (HOSPITAL_COMMUNITY): Payer: Medicaid Other

## 2011-10-09 DIAGNOSIS — E119 Type 2 diabetes mellitus without complications: Secondary | ICD-10-CM | POA: Insufficient documentation

## 2011-10-09 DIAGNOSIS — R079 Chest pain, unspecified: Secondary | ICD-10-CM | POA: Insufficient documentation

## 2011-10-09 DIAGNOSIS — F17200 Nicotine dependence, unspecified, uncomplicated: Secondary | ICD-10-CM | POA: Insufficient documentation

## 2011-10-09 DIAGNOSIS — Y93B3 Activity, free weights: Secondary | ICD-10-CM | POA: Insufficient documentation

## 2011-10-09 DIAGNOSIS — G4730 Sleep apnea, unspecified: Secondary | ICD-10-CM | POA: Insufficient documentation

## 2011-10-09 DIAGNOSIS — J45909 Unspecified asthma, uncomplicated: Secondary | ICD-10-CM | POA: Insufficient documentation

## 2011-10-09 DIAGNOSIS — S20219A Contusion of unspecified front wall of thorax, initial encounter: Secondary | ICD-10-CM | POA: Insufficient documentation

## 2011-10-09 DIAGNOSIS — I10 Essential (primary) hypertension: Secondary | ICD-10-CM | POA: Insufficient documentation

## 2011-10-09 DIAGNOSIS — W219XXA Striking against or struck by unspecified sports equipment, initial encounter: Secondary | ICD-10-CM | POA: Insufficient documentation

## 2011-10-09 NOTE — ED Notes (Signed)
C/o upper chest pain onset after lifting weights-states wrist "gave out" and weight bar fell on his chest

## 2011-10-10 ENCOUNTER — Emergency Department (HOSPITAL_COMMUNITY)
Admission: EM | Admit: 2011-10-10 | Discharge: 2011-10-10 | Disposition: A | Discharge status: Home / Self Care | Payer: Medicaid Other | Attending: Emergency Medicine | Admitting: Emergency Medicine

## 2011-10-10 DIAGNOSIS — S20219A Contusion of unspecified front wall of thorax, initial encounter: Secondary | ICD-10-CM

## 2011-10-10 MED ORDER — OXYCODONE-ACETAMINOPHEN 5-325 MG PO TABS
2.0000 | ORAL_TABLET | Freq: Once | ORAL | Status: AC
  Administered 2011-10-10: 2 via ORAL
  Filled 2011-10-10: qty 2

## 2011-10-10 MED ORDER — OXYCODONE-ACETAMINOPHEN 5-325 MG PO TABS: 1.0000 | ORAL_TABLET | Freq: Four times a day (QID) | ORAL | Status: AC | PRN

## 2011-10-10 NOTE — ED Notes (Signed)
Pt ambulatory with steady gait, in no distress, seen walking back inside ED waiting room from outside.

## 2011-10-10 NOTE — ED Provider Notes (Signed)
History     CSN: [id number]  Arrival date & time 10/09/11  2331   First MD Initiated Contact with Patient 10/10/11 0319      Chief Complaint  Patient presents with  . Chest Pain    (Consider location/radiation/quality/duration/timing/severity/associated sxs/prior treatment) Patient is a 41 y.o. male presenting with chest pain. The history is provided by the patient (pt was lifting weights and they fell on his chest). No language interpreter was used.  Chest Pain The chest pain began 3 - 5 hours ago. Chest pain occurs constantly. The chest pain is unchanged. The pain is associated with breathing and lifting. At its most intense, the pain is at 6/10. The severity of the pain is moderate. The quality of the pain is described as aching. The pain does not radiate. Exacerbated by: movement. Pertinent negatives for primary symptoms include no fever, no fatigue, no cough and no abdominal pain.  Pertinent negatives for associated symptoms include no claudication.  Pertinent negatives for past medical history include no seizures.     Past Medical History  Diagnosis Date  . Hypertension   . Diabetes mellitus   . Sleep apnea   . Abscess   . Chronic back pain   . Sciatica   . Asthma   . Bronchitis     History reviewed. No pertinent past surgical history.  Family History  Problem Relation Age of Onset  . Hypertension Mother   . Diabetes Mother   . Hypertension Father   . Diabetes Father   . Diabetes Brother   . Hypertension Brother     History  Substance Use Topics  . Smoking status: Current Everyday Smoker -- 0.5 packs/day    Types: Cigarettes  . Smokeless tobacco: Not on file  . Alcohol Use: No      Review of Systems  Constitutional: Negative for fever and fatigue.  HENT: Negative for congestion, sinus pressure and ear discharge.   Eyes: Negative for discharge.  Respiratory: Negative for cough.   Cardiovascular: Positive for chest pain. Negative for claudication.    Gastrointestinal: Negative for abdominal pain and diarrhea.  Genitourinary: Negative for frequency and hematuria.  Musculoskeletal: Negative for back pain.  Skin: Negative for rash.  Neurological: Negative for seizures and headaches.  Hematological: Negative.   Psychiatric/Behavioral: Negative for hallucinations.    Allergies  Sulfa antibiotics  Home Medications   Current Outpatient Rx  Name Route Sig Dispense Refill  . AMLODIPINE BESYLATE 10 MG PO TABS Oral Take 10 mg by mouth daily.      Marland Kitchen GLIPIZIDE 10 MG PO TABS Oral Take 10 mg by mouth 2 (two) times daily before a meal.      . HYDROCHLOROTHIAZIDE 25 MG PO TABS Oral Take 25 mg by mouth daily.      Marland Kitchen METFORMIN HCL 1000 MG PO TABS Oral Take 1,000 mg by mouth 2 (two) times daily with a meal.      . OXYCODONE-ACETAMINOPHEN 5-325 MG PO TABS Oral Take 1 tablet by mouth every 6 (six) hours as needed for pain. 30 tablet 0  . PRAVASTATIN SODIUM 20 MG PO TABS Oral Take 20 mg by mouth every evening.     Marland Kitchen SITAGLIPTIN PHOSPHATE 25 MG PO TABS Oral Take 25 mg by mouth daily.        BP 115/73  Pulse 86  Temp(Src) 97.5 F (36.4 C) (Oral)  Resp 18  Ht 6\' 1"  (1.854 m)  Wt 350 lb (158.759 kg)  BMI 46.18 kg/m2  SpO2 97%  Physical Exam  Constitutional: He is oriented to person, place, and time. He appears well-developed.  HENT:  Head: Normocephalic and atraumatic.  Eyes: Conjunctivae and EOM are normal. No scleral icterus.  Neck: Neck supple. No thyromegaly present.  Cardiovascular: Normal rate and regular rhythm.  Exam reveals no gallop and no friction rub.   No murmur heard. Pulmonary/Chest: No stridor. He has no wheezes. He has no rales. He exhibits tenderness.  Abdominal: He exhibits no distension. There is no tenderness. There is no rebound.  Musculoskeletal: Normal range of motion. He exhibits no edema.  Lymphadenopathy:    He has no cervical adenopathy.  Neurological: He is oriented to person, place, and time. Coordination  normal.  Skin: No rash noted. No erythema.  Psychiatric: He has a normal mood and affect. His behavior is normal.    ED Course  Procedures (including critical care time)  Labs Reviewed - No data to display Dg Chest 2 View  10/10/2011  *RADIOLOGY REPORT*  Clinical Data: Anterior chest pain after dropping weights on chest.  CHEST - 2 VIEW  Comparison: 06/27/2011  Findings: Normal heart size and pulmonary vascularity.  No focal airspace consolidation in the lungs.  No blunting of costophrenic angles.  No pneumothorax.  No significant changes since previous study. Visualized sternum is not depressed.  IMPRESSION: No evidence of active pulmonary disease.  Original Report Authenticated By: Marlon Pel, M.D.     1. Chest wall contusion       MDM          [name], MD 10/10/11 [phone]

## 2011-10-10 NOTE — Discharge Instructions (Signed)
Follow up with your md if not improving. °

## 2011-10-24 ENCOUNTER — Encounter (HOSPITAL_COMMUNITY): Payer: Self-pay | Admitting: *Deleted

## 2011-10-24 ENCOUNTER — Emergency Department (HOSPITAL_COMMUNITY)
Admission: EM | Admit: 2011-10-24 | Discharge: 2011-10-25 | Disposition: A | Discharge status: Home / Self Care | Payer: Medicaid Other | Attending: Emergency Medicine | Admitting: Emergency Medicine

## 2011-10-24 ENCOUNTER — Emergency Department (HOSPITAL_COMMUNITY): Payer: Medicaid Other

## 2011-10-24 DIAGNOSIS — R5383 Other fatigue: Secondary | ICD-10-CM | POA: Insufficient documentation

## 2011-10-24 DIAGNOSIS — E119 Type 2 diabetes mellitus without complications: Secondary | ICD-10-CM | POA: Insufficient documentation

## 2011-10-24 DIAGNOSIS — Z79899 Other long term (current) drug therapy: Secondary | ICD-10-CM | POA: Insufficient documentation

## 2011-10-24 DIAGNOSIS — R5381 Other malaise: Secondary | ICD-10-CM | POA: Insufficient documentation

## 2011-10-24 DIAGNOSIS — M2550 Pain in unspecified joint: Secondary | ICD-10-CM | POA: Insufficient documentation

## 2011-10-24 DIAGNOSIS — M7989 Other specified soft tissue disorders: Secondary | ICD-10-CM | POA: Insufficient documentation

## 2011-10-24 DIAGNOSIS — M79609 Pain in unspecified limb: Secondary | ICD-10-CM | POA: Insufficient documentation

## 2011-10-24 DIAGNOSIS — J45909 Unspecified asthma, uncomplicated: Secondary | ICD-10-CM | POA: Insufficient documentation

## 2011-10-24 DIAGNOSIS — F17200 Nicotine dependence, unspecified, uncomplicated: Secondary | ICD-10-CM | POA: Insufficient documentation

## 2011-10-24 DIAGNOSIS — I10 Essential (primary) hypertension: Secondary | ICD-10-CM | POA: Insufficient documentation

## 2011-10-24 MED ORDER — HYDROCODONE-ACETAMINOPHEN 5-325 MG PO TABS
1.0000 | ORAL_TABLET | Freq: Once | ORAL | Status: AC
  Administered 2011-10-25: 1 via ORAL
  Filled 2011-10-24: qty 1

## 2011-10-24 NOTE — ED Notes (Signed)
Pt reports pain to his left pinkie toe x 2 days, pt denies any injury

## 2011-10-24 NOTE — ED Notes (Signed)
Into room to assess patient. States his left pinky toe started hurting and swelling two days ago. Does not know if he injured it. Slight swelling and painful on movement and palpation. Brisk cap refill. Strong palpable petal pulse in left foot. States it is a throbbing pain that shoots to about one inch past toe in the direction of the ankle. No deformity to toe. Visitor with patient. Denies needs. Awaiting to be see.

## 2011-10-24 NOTE — ED Notes (Signed)
Burgess Amor, PA at bedside to evaluate.

## 2011-10-24 NOTE — ED Notes (Signed)
Patient ambulatory to radiology for xray

## 2011-10-25 MED ORDER — OXYCODONE-ACETAMINOPHEN 5-325 MG PO TABS: 1.0000 | ORAL_TABLET | ORAL | Status: AC | PRN

## 2011-10-25 MED ORDER — IBUPROFEN 600 MG PO TABS: 600.0000 mg | ORAL_TABLET | Freq: Four times a day (QID) | ORAL | Status: AC | PRN

## 2011-10-25 NOTE — ED Provider Notes (Signed)
Medical screening examination/treatment/procedure(s) were performed by non-physician practitioner and as supervising physician I was immediately available for consultation/collaboration.    Vida Roller, MD 10/25/11 2221

## 2011-10-25 NOTE — ED Notes (Signed)
Patient back to room from radiology. 

## 2011-10-25 NOTE — ED Provider Notes (Signed)
History     CSN: [id number]  Arrival date & time 10/24/11  2256   First MD Initiated Contact with Patient 10/24/11 2307      Chief Complaint  Patient presents with  . Toe Pain    (Consider location/radiation/quality/duration/timing/severity/associated sxs/prior treatment) HPI Comments: Shawn Newman presents for evaluation of pain in his left fifth toe which he will with 2 days ago and has been persistent.  He describes sharp pain which radiates into his left lateral midfoot which is worsened with weightbearing and attempting to move his toes.  He denies fevers or chills, no recent injuries, but did report being increasingly active on his feet the day prior at a children's birthday party during which time he was dancing, and then woke the following morning with this pain.  He does have mild swelling at the base of his fifth toe.  Past medical history significant for diabetes.  He does not have a personal history of gout.  Patient is a 41 y.o. male presenting with toe pain. The history is provided by the patient and the spouse.  Toe Pain Associated symptoms include arthralgias and weakness. Pertinent negatives include no joint swelling.    Past Medical History  Diagnosis Date  . Hypertension   . Diabetes mellitus   . Sleep apnea   . Abscess   . Chronic back pain   . Sciatica   . Asthma   . Bronchitis     History reviewed. No pertinent past surgical history.  Family History  Problem Relation Age of Onset  . Hypertension Mother   . Diabetes Mother   . Hypertension Father   . Diabetes Father   . Diabetes Brother   . Hypertension Brother     History  Substance Use Topics  . Smoking status: Current Everyday Smoker -- 0.5 packs/day    Types: Cigarettes  . Smokeless tobacco: Not on file  . Alcohol Use: No      Review of Systems  Musculoskeletal: Positive for arthralgias. Negative for joint swelling.  Skin: Negative for wound.  Neurological: Positive for weakness.      Allergies  Sulfa antibiotics  Home Medications   Current Outpatient Rx  Name Route Sig Dispense Refill  . AMLODIPINE BESYLATE 10 MG PO TABS Oral Take 10 mg by mouth daily.      Marland Kitchen GLIPIZIDE 10 MG PO TABS Oral Take 10 mg by mouth 2 (two) times daily before a meal.      . HYDROCHLOROTHIAZIDE 25 MG PO TABS Oral Take 25 mg by mouth daily.      . IBUPROFEN 600 MG PO TABS Oral Take 1 tablet (600 mg total) by mouth every 6 (six) hours as needed for pain. 30 tablet 0  . METFORMIN HCL 1000 MG PO TABS Oral Take 1,000 mg by mouth 2 (two) times daily with a meal.      . OXYCODONE-ACETAMINOPHEN 5-325 MG PO TABS Oral Take 1 tablet by mouth every 4 (four) hours as needed for pain. 20 tablet 0  . PRAVASTATIN SODIUM 20 MG PO TABS Oral Take 20 mg by mouth every evening.     Marland Kitchen SITAGLIPTIN PHOSPHATE 25 MG PO TABS Oral Take 25 mg by mouth daily.        BP 136/82  Pulse 98  Temp(Src) 97.5 F (36.4 C) (Oral)  Resp 22  Ht 5\' 11"  (1.803 m)  Wt 350 lb (158.759 kg)  BMI 48.82 kg/m2  SpO2 98%  Physical Exam  Nursing note and  vitals reviewed. Constitutional: He appears well-developed and well-nourished.  HENT:  Head: Normocephalic.  Cardiovascular: Normal rate and intact distal pulses.  Exam reveals no decreased pulses.   Pulses:      Dorsalis pedis pulses are 2+ on the left side.  Musculoskeletal: He exhibits tenderness. He exhibits no edema.       Left foot: He exhibits decreased range of motion and tenderness. He exhibits no swelling, normal capillary refill, no crepitus and no deformity.       Feet:  Neurological: He is alert. No sensory deficit.  Skin: Skin is warm, dry and intact.    ED Course  Procedures (including critical care time)  Labs Reviewed - No data to display Dg Foot Complete Left  10/25/2011  *RADIOLOGY REPORT*  Clinical Data: Lateral foot pain.  LEFT FOOT - COMPLETE 3+ VIEW  Comparison: None.  Findings: No fracture.  No subluxation or dislocation.  No worrisome lytic or  sclerotic osseous abnormality.  IMPRESSION: No acute bony findings.  Original Report Authenticated By: ERIC A. MANSELL, M.D.     1. Pain in toe, left       MDM  X-ray reviews no stress fracture a source of pain.  X-ray reviewed.  No appreciable edema or increased warmth over the toe joints to suggest gout, although this is a possible etiology of pain.  Will treat with anti-inflammatories and narcotic pain reliever.  Encouraged elevation and warm compresses.  Followup with PCP if not improved over the next week.  We did discuss his diabetes and he states his CBG is fairly well-controlled and he does take his medications daily.  His last blood sugar was this morning after breakfast and was 180.        Burgess Amor, [place] 10/25/11 1216

## 2011-10-25 NOTE — ED Notes (Addendum)
Pain 8\10. Denies needs. Resting comfortably. Family at bedside. Call bell within reach. Will continue to monitor.

## 2011-10-25 NOTE — Discharge Instructions (Signed)
Your x-rays negative for any injury in your foot or fifth toe.  You may use elevation and a warm (not hot)  heating pad applied to your toe for 15 minute increments only, 4 times daily.  Use the ibuprofen for inflammation and Percocet for additional pain relief, using caution not to drive within 4 hours of taking Percocet as this will make you drowsy.  Followup with your Dr. For recheck if your symptoms are not completely resolved over the next 4-5 days.

## 2011-11-30 ENCOUNTER — Emergency Department (HOSPITAL_COMMUNITY): Payer: Medicaid Other

## 2011-11-30 ENCOUNTER — Emergency Department (HOSPITAL_COMMUNITY)
Admission: EM | Admit: 2011-11-30 | Discharge: 2011-11-30 | Disposition: A | Discharge status: Home / Self Care | Payer: Medicaid Other | Attending: Emergency Medicine | Admitting: Emergency Medicine

## 2011-11-30 ENCOUNTER — Encounter (HOSPITAL_COMMUNITY): Payer: Self-pay | Admitting: *Deleted

## 2011-11-30 DIAGNOSIS — S20229A Contusion of unspecified back wall of thorax, initial encounter: Secondary | ICD-10-CM | POA: Insufficient documentation

## 2011-11-30 DIAGNOSIS — G8929 Other chronic pain: Secondary | ICD-10-CM | POA: Insufficient documentation

## 2011-11-30 DIAGNOSIS — J45909 Unspecified asthma, uncomplicated: Secondary | ICD-10-CM | POA: Insufficient documentation

## 2011-11-30 DIAGNOSIS — Y92009 Unspecified place in unspecified non-institutional (private) residence as the place of occurrence of the external cause: Secondary | ICD-10-CM | POA: Insufficient documentation

## 2011-11-30 DIAGNOSIS — E119 Type 2 diabetes mellitus without complications: Secondary | ICD-10-CM | POA: Insufficient documentation

## 2011-11-30 DIAGNOSIS — Z79899 Other long term (current) drug therapy: Secondary | ICD-10-CM | POA: Insufficient documentation

## 2011-11-30 DIAGNOSIS — M546 Pain in thoracic spine: Secondary | ICD-10-CM | POA: Insufficient documentation

## 2011-11-30 DIAGNOSIS — W108XXA Fall (on) (from) other stairs and steps, initial encounter: Secondary | ICD-10-CM | POA: Insufficient documentation

## 2011-11-30 DIAGNOSIS — G4730 Sleep apnea, unspecified: Secondary | ICD-10-CM | POA: Insufficient documentation

## 2011-11-30 DIAGNOSIS — F17200 Nicotine dependence, unspecified, uncomplicated: Secondary | ICD-10-CM | POA: Insufficient documentation

## 2011-11-30 DIAGNOSIS — I10 Essential (primary) hypertension: Secondary | ICD-10-CM | POA: Insufficient documentation

## 2011-11-30 MED ORDER — HYDROCODONE-ACETAMINOPHEN 5-325 MG PO TABS
1.0000 | ORAL_TABLET | Freq: Once | ORAL | Status: AC
  Administered 2011-11-30: 1 via ORAL
  Filled 2011-11-30: qty 1

## 2011-11-30 MED ORDER — IBUPROFEN 800 MG PO TABS
800.0000 mg | ORAL_TABLET | Freq: Once | ORAL | Status: AC
  Administered 2011-11-30: 800 mg via ORAL
  Filled 2011-11-30: qty 1

## 2011-11-30 MED ORDER — HYDROCODONE-ACETAMINOPHEN 5-325 MG PO TABS: ORAL_TABLET | ORAL | Status: DC

## 2011-11-30 NOTE — Discharge Instructions (Signed)
Contusion A contusion is a deep bruise. Contusions are the result of an injury that caused bleeding under the skin. The contusion may turn blue, purple, or yellow. Minor injuries will give you a painless contusion, but more severe contusions may stay painful and swollen for a few weeks.  CAUSES  A contusion is usually caused by a blow, trauma, or direct force to an area of the body. SYMPTOMS   Swelling and redness of the injured area.   Bruising of the injured area.   Tenderness and soreness of the injured area.   Pain.  DIAGNOSIS  The diagnosis can be made by taking a history and physical exam. An X-ray, CT scan, or MRI may be needed to determine if there were any associated injuries, such as fractures. TREATMENT  Specific treatment will depend on what area of the body was injured. In general, the best treatment for a contusion is resting, icing, elevating, and applying cold compresses to the injured area. Over-the-counter medicines may also be recommended for pain control. Ask your caregiver what the best treatment is for your contusion. HOME CARE INSTRUCTIONS   Put ice on the injured area.   Put ice in a plastic bag.   Place a towel between your skin and the bag.   Leave the ice on for 15 to 20 minutes, 3 to 4 times a day.   Only take over-the-counter or prescription medicines for pain, discomfort, or fever as directed by your caregiver. Your caregiver may recommend avoiding anti-inflammatory medicines (aspirin, ibuprofen, and naproxen) for 48 hours because these medicines may increase bruising.   Rest the injured area.   If possible, elevate the injured area to reduce swelling.  SEEK IMMEDIATE MEDICAL CARE IF:   You have increased bruising or swelling.   You have pain that is getting worse.   Your swelling or pain is not relieved with medicines.  MAKE SURE YOU:   Understand these instructions.   Will watch your condition.   Will get help right away if you are not  doing well or get worse.  Document Released: 03/15/2005 Document Revised: 05/25/2011 Document Reviewed: 04/10/2011 [facility] Patient Information 2012 [place], [place].Cryotherapy Cryotherapy means treatment with cold. Ice or gel packs can be used to reduce both pain and swelling. Ice is the most helpful within the first 24 to 48 hours after an injury or flareup from overusing a muscle or joint. Sprains, strains, spasms, burning pain, shooting pain, and aches can all be eased with ice. Ice can also be used when recovering from surgery. Ice is effective, has very few side effects, and is safe for most people to use. PRECAUTIONS  Ice is not a safe treatment option for people with:  Raynaud's phenomenon. This is a condition affecting small blood vessels in the extremities. Exposure to cold may cause your problems to return.   Cold hypersensitivity. There are many forms of cold hypersensitivity, including:   Cold urticaria. Red, itchy hives appear on the skin when the tissues begin to warm after being iced.   Cold erythema. This is a red, itchy rash caused by exposure to cold.   Cold hemoglobinuria. Red blood cells break down when the tissues begin to warm after being iced. The hemoglobin that carry oxygen are passed into the urine because they cannot combine with blood proteins fast enough.   Numbness or altered sensitivity in the area being iced.  If you have any of the following conditions, do not use ice until you have discussed  cryotherapy with your caregiver:  Heart conditions, such as arrhythmia, angina, or chronic heart disease.   High blood pressure.   Healing wounds or open skin in the area being iced.   Current infections.   Rheumatoid arthritis.   Poor circulation.   Diabetes.  Ice slows the blood flow in the region it is applied. This is beneficial when trying to stop inflamed tissues from spreading irritating chemicals to surrounding tissues. However, if you expose your skin  to cold temperatures for too long or without the proper protection, you can damage your skin or nerves. Watch for signs of skin damage due to cold. HOME CARE INSTRUCTIONS Follow these tips to use ice and cold packs safely.  Place a dry or damp towel between the ice and skin. A damp towel will cool the skin more quickly, so you may need to shorten the time that the ice is used.   For a more rapid response, add gentle compression to the ice.   Ice for no more than 10 to 20 minutes at a time. The bonier the area you are icing, the less time it will take to get the benefits of ice.   Check your skin after 5 minutes to make sure there are no signs of a poor response to cold or skin damage.   Rest 20 minutes or more in between uses.   Once your skin is numb, you can end your treatment. You can test numbness by very lightly touching your skin. The touch should be so light that you do not see the skin dimple from the pressure of your fingertip. When using ice, most people will feel these normal sensations in this order: cold, burning, aching, and numbness.   Do not use ice on someone who cannot communicate their responses to pain, such as small children or people with dementia.  HOW TO MAKE AN ICE PACK Ice packs are the most common way to use ice therapy. Other methods include ice massage, ice baths, and cryo-sprays. Muscle creams that cause a cold, tingly feeling do not offer the same benefits that ice offers and should not be used as a substitute unless recommended by your caregiver. To make an ice pack, do one of the following:  Place crushed ice or a bag of frozen vegetables in a sealable plastic bag. Squeeze out the excess air. Place this bag inside another plastic bag. Slide the bag into a pillowcase or place a damp towel between your skin and the bag.   Mix 3 parts water with 1 part rubbing alcohol. Freeze the mixture in a sealable plastic bag. When you remove the mixture from the freezer, it  will be slushy. Squeeze out the excess air. Place this bag inside another plastic bag. Slide the bag into a pillowcase or place a damp towel between your skin and the bag.  SEEK MEDICAL CARE IF:  You develop white spots on your skin. This may give the skin a blotchy (mottled) appearance.   Your skin turns blue or pale.   Your skin becomes waxy or hard.   Your swelling gets worse.  MAKE SURE YOU:   Understand these instructions.   Will watch your condition.   Will get help right away if you are not doing well or get worse.  Document Released: 01/30/2011 Document Revised: 05/25/2011 Document Reviewed: 01/30/2011 [facility] Patient Information 2012 [place], [place].   Take the pain medicine as directed.  Take ibuprofen 800 mg every 8 hrs with food.  Apply ice several times daily.  Follow up with your MD at the health dept as needed.  The x-rays and subsequent CT scan showed no fractures.

## 2011-11-30 NOTE — ED Notes (Signed)
Fell, slipped while running from his dog, c/o back pain

## 2011-11-30 NOTE — ED Provider Notes (Signed)
History     CSN: [id number]  Arrival date & time 11/30/11  2106   None     Chief Complaint  Patient presents with  . Fall    (Consider location/radiation/quality/duration/timing/severity/associated sxs/prior treatment) Patient is a 41 y.o. male presenting with fall. The history is provided by the patient. No language interpreter was used.  Fall The accident occurred 1 to 2 hours ago. Incident: pt states his dogs were fighting.  he was walking down the ouside stairs which were wet and he lost his footing and fell striking upper back on the step edge.  no LOC.  no other complaints. There was no blood loss. The pain is severe. He was ambulatory at the scene. There was no entrapment after the fall. There was no drug use involved in the accident. There was no alcohol use involved in the accident. Pertinent negatives include no numbness. He has tried acetaminophen for the symptoms. The treatment provided no relief.    Past Medical History  Diagnosis Date  . Hypertension   . Diabetes mellitus   . Sleep apnea   . Abscess   . Chronic back pain   . Sciatica   . Asthma   . Bronchitis     History reviewed. No pertinent past surgical history.  Family History  Problem Relation Age of Onset  . Hypertension Mother   . Diabetes Mother   . Hypertension Father   . Diabetes Father   . Diabetes Brother   . Hypertension Brother     History  Substance Use Topics  . Smoking status: Current Everyday Smoker -- 0.5 packs/day    Types: Cigarettes  . Smokeless tobacco: Not on file  . Alcohol Use: No      Review of Systems  Musculoskeletal: Positive for back pain.  Neurological: Negative for weakness and numbness.  All other systems reviewed and are negative.    Allergies  Sulfa antibiotics  Home Medications   Current Outpatient Rx  Name Route Sig Dispense Refill  . ACETAMINOPHEN 500 MG PO TABS Oral Take 1,500 mg by mouth as needed.    . ALBUTEROL SULFATE HFA 108 (90 BASE)  MCG/ACT IN AERS Inhalation Inhale 2 puffs into the lungs every 6 (six) hours as needed.    Marland Kitchen AMLODIPINE BESYLATE 10 MG PO TABS Oral Take 10 mg by mouth daily.      . BC HEADACHE PO Oral Take 1 packet by mouth as needed. For pain    . FLUTICASONE-SALMETEROL 250-50 MCG/DOSE IN AEPB Inhalation Inhale 1 puff into the lungs every 12 (twelve) hours.    Marland Kitchen GLIPIZIDE 10 MG PO TABS Oral Take 10 mg by mouth 2 (two) times daily before a meal.      . HYDROCHLOROTHIAZIDE 25 MG PO TABS Oral Take 25 mg by mouth daily.     Marland Kitchen METFORMIN HCL 1000 MG PO TABS Oral Take 1,000 mg by mouth 2 (two) times daily with a meal.      . ONE-A-DAY MENS PO Oral Take 1 tablet by mouth daily.    Marland Kitchen PRAVASTATIN SODIUM 20 MG PO TABS Oral Take 20 mg by mouth every evening.     Marland Kitchen SITAGLIPTIN PHOSPHATE 25 MG PO TABS Oral Take 25 mg by mouth daily.      Marland Kitchen HYDROCODONE-ACETAMINOPHEN 5-325 MG PO TABS  One tab po q 4-6 hrs prn pain 20 tablet 0    BP 134/71  Pulse 93  Temp 98 F (36.7 C) (Oral)  Resp 20  Ht 6\' 1"  (1.854 m)  Wt 350 lb (158.759 kg)  BMI 46.18 kg/m2  SpO2 98%  Physical Exam  Nursing note and vitals reviewed. Constitutional: He is oriented to person, place, and time. He appears well-developed and well-nourished.  HENT:  Head: Normocephalic and atraumatic.  Eyes: EOM are normal.  Neck: Normal range of motion.  Cardiovascular: Normal rate, regular rhythm, normal heart sounds and intact distal pulses.   Pulmonary/Chest: Effort normal and breath sounds normal. No respiratory distress. He has no wheezes.  Abdominal: Soft. He exhibits no distension. There is no tenderness.  Musculoskeletal: Normal range of motion.       Back:  Neurological: He is alert and oriented to person, place, and time.  Skin: Skin is warm and dry.  Psychiatric: He has a normal mood and affect. Judgment normal.    ED Course  Procedures (including critical care time)  Labs Reviewed - No data to display Dg Thoracic Spine  W/swimmers  11/30/2011  *RADIOLOGY REPORT*  Clinical Data: Pain status post fall  THORACIC SPINE - 2 VIEW + SWIMMERS  Comparison: 10/09/2011 chest radiograph  Findings: Visualized portions of the lungs are clear.  Mild multilevel degenerative change.  There is mild height loss of a mid thoracic vertebral body however this is without interval change. Unfortunately, the upper thoracic spine is obscured by the patient's shoulders on the lateral view, limiting evaluation.  IMPRESSION: The upper thoracic spine is obscured by the patient's shoulders on the lateral view.  Alignment is maintained and no displaced fracture identified. However, if clinical concern persists, recommend CT through the upper thoracic spine.  Original Report Authenticated By: Waneta Martins, M.D.   Ct Thoracic Spine Wo Contrast  11/30/2011  *RADIOLOGY REPORT*  Clinical Data: Fall, upper thoracic pain  CT THORACIC SPINE WITHOUT CONTRAST  Technique:  Multidetector CT imaging of the T1 through T8 thoracic spine was performed without intravenous contrast administration. Multiplanar CT image reconstructions were also generated  Comparison: Contemporaneous radiographs  Findings: Mild anterior height loss at T8 is similar to comparison. Well corticated lucency through the T4 spinous process is not favored to be acute. Otherwise, maintained vertebral body height and alignment. Anterior bridging osteophyte at T4-5.  Mild post ossification at C7.  Paravertebral soft tissues within normal limits.  Respiratory motion degrades detailed parenchymal evaluation.  No focal abnormality within the visualized portions of the lungs.  Coronary artery calcification.  IMPRESSION: No acute fracture or dislocation identified.  Mild height loss at T8 is unchanged from priors.  Original Report Authenticated By: Waneta Martins, M.D.     1. Back contusion       MDM  No fx's visualized Ice, ibuprofen, rx-hydrocodone, 20 F/u with PCP at [facility]  prn.        Worthy Rancher, PA 11/30/11 2350

## 2011-12-01 NOTE — ED Provider Notes (Signed)
Medical screening examination/treatment/procedure(s) were performed by non-physician practitioner and as supervising physician I was immediately available for consultation/collaboration.   Marvel Mcphillips M Nao Linz, DO 12/01/11 1213 

## 2011-12-06 ENCOUNTER — Encounter (HOSPITAL_COMMUNITY): Payer: Self-pay

## 2011-12-06 ENCOUNTER — Emergency Department (HOSPITAL_COMMUNITY): Payer: Medicaid Other

## 2011-12-06 ENCOUNTER — Emergency Department (HOSPITAL_COMMUNITY)
Admission: EM | Admit: 2011-12-06 | Discharge: 2011-12-06 | Disposition: A | Discharge status: Home / Self Care | Payer: Medicaid Other | Attending: Emergency Medicine | Admitting: Emergency Medicine

## 2011-12-06 DIAGNOSIS — F17200 Nicotine dependence, unspecified, uncomplicated: Secondary | ICD-10-CM | POA: Insufficient documentation

## 2011-12-06 DIAGNOSIS — G8929 Other chronic pain: Secondary | ICD-10-CM | POA: Insufficient documentation

## 2011-12-06 DIAGNOSIS — E119 Type 2 diabetes mellitus without complications: Secondary | ICD-10-CM | POA: Insufficient documentation

## 2011-12-06 DIAGNOSIS — I10 Essential (primary) hypertension: Secondary | ICD-10-CM | POA: Insufficient documentation

## 2011-12-06 DIAGNOSIS — S0590XA Unspecified injury of unspecified eye and orbit, initial encounter: Secondary | ICD-10-CM

## 2011-12-06 DIAGNOSIS — R079 Chest pain, unspecified: Secondary | ICD-10-CM | POA: Insufficient documentation

## 2011-12-06 DIAGNOSIS — M549 Dorsalgia, unspecified: Secondary | ICD-10-CM | POA: Insufficient documentation

## 2011-12-06 DIAGNOSIS — G4730 Sleep apnea, unspecified: Secondary | ICD-10-CM | POA: Insufficient documentation

## 2011-12-06 MED ORDER — FLUORESCEIN SODIUM 1 MG OP STRP
ORAL_STRIP | OPHTHALMIC | Status: AC
  Filled 2011-12-06: qty 1

## 2011-12-06 MED ORDER — TETRACAINE HCL 0.5 % OP SOLN
OPHTHALMIC | Status: AC
  Filled 2011-12-06: qty 2

## 2011-12-06 MED ORDER — OXYCODONE-ACETAMINOPHEN 5-325 MG PO TABS
1.0000 | ORAL_TABLET | Freq: Once | ORAL | Status: AC
  Administered 2011-12-06: 1 via ORAL
  Filled 2011-12-06: qty 1

## 2011-12-06 MED ORDER — TETRACAINE HCL 0.5 % OP SOLN
1.0000 [drp] | Freq: Once | OPHTHALMIC | Status: AC
  Administered 2011-12-06: 1 [drp] via OPHTHALMIC

## 2011-12-06 MED ORDER — ERYTHROMYCIN 5 MG/GM OP OINT: TOPICAL_OINTMENT | Freq: Four times a day (QID) | OPHTHALMIC | Status: AC

## 2011-12-06 MED ORDER — FLUORESCEIN SODIUM 1 MG OP STRP
1.0000 | ORAL_STRIP | Freq: Once | OPHTHALMIC | Status: AC
  Administered 2011-12-06: 1 via OPHTHALMIC

## 2011-12-06 NOTE — Discharge Instructions (Signed)

## 2011-12-06 NOTE — ED Provider Notes (Signed)
History  This chart was scribed for Joya Gaskins, MD by Bennett Scrape. This patient was seen in room APA17/[facility]17 and the patient's care was started at 9:28AM.  CSN: [id number]  Arrival date & time 12/06/11  4098   First MD Initiated Contact with Patient 12/06/11 [phone]      Chief Complaint  Patient presents with  . Chest Pain  . Back Pain  . Chemical Exposure    Patient is a 41 y.o. male presenting with chest pain. The history is provided by the patient. No language interpreter was used.  Chest Pain The chest pain began 6 - 12 hours ago. Chest pain occurs intermittently. The chest pain is unchanged. The quality of the pain is described as sharp. The pain does not radiate. Chest pain is worsened by deep breathing. He tried nothing for the symptoms. Risk factors include obesity.  His past medical history is significant for diabetes and hypertension.     Shawn Newman is a 41 y.o. male who presents to the Emergency Department complaining of intermittent brief sharp shooting pains in left chest since last night with associated SOB. He states that the episodes occur every ten minutes. The pain is occasionally worse with deep breathing. He denies taking OTC medications at home to improve symptoms. He denies having prior episodes of similar symptoms. He also c/o clorox liquid draino splashing in his left eye last night. He reports flushing his eye for 15 minutes with water but states that the eye still feels irritated. Pt also c/o lower back pain due to a "buldging disc". He was seen here one week ago for back pain after falling.The pain is worse with movement. He denies taking OTC medications at home to improve symptoms.  He denies diaphoresis, nausea and emesis. He denies having a h/o heart problems. He has a h/o HTN, DM, sciatica and asthma. He is a current everyday smoker but denies alcohol use.  He does not wear contacts  Past Medical History  Diagnosis Date  . Hypertension   .  Diabetes mellitus   . Sleep apnea   . Abscess   . Chronic back pain   . Sciatica   . Asthma   . Bronchitis     History reviewed. No pertinent past surgical history.  Family History  Problem Relation Age of Onset  . Hypertension Mother   . Diabetes Mother   . Hypertension Father   . Diabetes Father   . Diabetes Brother   . Hypertension Brother     History  Substance Use Topics  . Smoking status: Current Everyday Smoker -- 0.5 packs/day    Types: Cigarettes  . Smokeless tobacco: Not on file  . Alcohol Use: No      Review of Systems  A complete 10 system review of systems was obtained and all systems are negative except as noted in the HPI and PMH.   Allergies  Sulfa antibiotics  Home Medications   Current Outpatient Rx  Name Route Sig Dispense Refill  . ACETAMINOPHEN 500 MG PO TABS Oral Take 1,500 mg by mouth as needed.    . ALBUTEROL SULFATE HFA 108 (90 BASE) MCG/ACT IN AERS Inhalation Inhale 2 puffs into the lungs every 6 (six) hours as needed.    Marland Kitchen AMLODIPINE BESYLATE 10 MG PO TABS Oral Take 10 mg by mouth daily.      . BC HEADACHE PO Oral Take 1 packet by mouth as needed. For pain    . FLUTICASONE-SALMETEROL 250-50 MCG/DOSE  IN AEPB Inhalation Inhale 1 puff into the lungs every 12 (twelve) hours.    Marland Kitchen GLIPIZIDE 10 MG PO TABS Oral Take 10 mg by mouth 2 (two) times daily before a meal.      . HYDROCHLOROTHIAZIDE 25 MG PO TABS Oral Take 25 mg by mouth daily.     Marland Kitchen HYDROCODONE-ACETAMINOPHEN 5-325 MG PO TABS  One tab po q 4-6 hrs prn pain 20 tablet 0  . METFORMIN HCL 1000 MG PO TABS Oral Take 1,000 mg by mouth 2 (two) times daily with a meal.      . ONE-A-DAY MENS PO Oral Take 1 tablet by mouth daily.    Marland Kitchen PRAVASTATIN SODIUM 20 MG PO TABS Oral Take 20 mg by mouth every evening.     Marland Kitchen SITAGLIPTIN PHOSPHATE 25 MG PO TABS Oral Take 25 mg by mouth daily.        Triage Vitals: BP 136/77  Pulse 74  Temp 97.7 F (36.5 C) (Oral)  Resp 20  Ht 6\' 1"  (1.854 m)  Wt 361  lb (163.749 kg)  BMI 47.63 kg/m2  SpO2 92% BP 136/77  Pulse 74  Temp 97.7 F (36.5 C) (Oral)  Resp 20  Ht 6\' 1"  (1.854 m)  Wt 361 lb (163.749 kg)  BMI 47.63 kg/m2  SpO2 94%  Physical Exam  Nursing note and vitals reviewed.  CONSTITUTIONAL: Well developed/well nourished HEAD AND FACE: Normocephalic/atraumatic EYES: EOMI/PERRL, no discharge, no erythema, no abrasions, no consensual pain, no foreign body noted PH in each eye equal/appropriate  ENMT: Mucous membranes moist NECK: supple no meningeal signs SPINE:entire spine nontender CV: S1/S2 noted, no murmurs/rubs/gallops noted LUNGS: Lungs are clear to auscultation, no apparent distress ABDOMEN: soft, nontender, no rebound or guarding GU:no cva tenderness NEURO: Pt is awake/alert, moves all extremitiesx4 EXTREMITIES: pulses normal, full ROM, no edema, no tenderness noted SKIN: warm, color normal PSYCH: no abnormalities of mood noted  ED Course  Procedures   DIAGNOSTIC STUDIES: Oxygen Saturation is 94% on room air, adequate by my interpretation.    COORDINATION OF CARE: 10:26AM-Discussed treatment plan which includes breathing treatment with pt and pt agreed to plan.    Dg Chest 2 View  12/06/2011  *RADIOLOGY REPORT*  Clinical Data: Chest pain  CHEST - 2 VIEW  Comparison: 11/30/2010 thoracic spine CT  Findings: Linear opacities within the left lung likely reflect scarring or atelectasis.  Heart size upper normal limits. Mediastinal contours otherwise within normal range.  No pleural effusion or pneumothorax.  Mild multilevel degenerative changes.  IMPRESSION: Linear left lung opacities likely reflects scarring or atelectasis. Otherwise, no radiographic evidence of acute cardiopulmonary process.  Original Report Authenticated By: Waneta Martins, M.D.    Pt in no distress, well appearing, no current pain, doubt ACS/PE at this time.   Will place on erythromycin for his eye and refer to ophthalmologist   MDM  Nursing  notes including past medical history and social history reviewed and considered in documentation xrays reviewed and considered    Date: 12/06/2011  Rate: 73  Rhythm: normal sinus rhythm  QRS Axis: normal  Intervals: normal  ST/T Wave abnormalities: normal  Conduction Disutrbances:none  Narrative Interpretation:   Old EKG Reviewed: unchanged      I personally performed the services described in this documentation, which was scribed in my presence. The recorded information has been reviewed and considered.      Joya Gaskins, MD 12/06/11 1054

## 2011-12-06 NOTE — ED Notes (Signed)
Pt reports intermittent sharp pain in left side of chest since last night.  Reports some SOB, no n/v.  Also reports had some clorox liquid draino splash in his left eye last night.  Pt also c/o lower back pain.  Says has buldging disc in lower back.

## 2011-12-18 ENCOUNTER — Encounter (HOSPITAL_COMMUNITY): Payer: Self-pay | Admitting: Emergency Medicine

## 2011-12-18 ENCOUNTER — Emergency Department (HOSPITAL_COMMUNITY): Payer: Medicaid Other

## 2011-12-18 ENCOUNTER — Emergency Department (HOSPITAL_COMMUNITY)
Admission: EM | Admit: 2011-12-18 | Discharge: 2011-12-18 | Disposition: A | Discharge status: Home / Self Care | Payer: Medicaid Other | Attending: Emergency Medicine | Admitting: Emergency Medicine

## 2011-12-18 ENCOUNTER — Other Ambulatory Visit: Payer: Self-pay

## 2011-12-18 DIAGNOSIS — I10 Essential (primary) hypertension: Secondary | ICD-10-CM | POA: Insufficient documentation

## 2011-12-18 DIAGNOSIS — J45909 Unspecified asthma, uncomplicated: Secondary | ICD-10-CM | POA: Insufficient documentation

## 2011-12-18 DIAGNOSIS — M542 Cervicalgia: Secondary | ICD-10-CM | POA: Insufficient documentation

## 2011-12-18 DIAGNOSIS — Z79899 Other long term (current) drug therapy: Secondary | ICD-10-CM | POA: Insufficient documentation

## 2011-12-18 DIAGNOSIS — R079 Chest pain, unspecified: Secondary | ICD-10-CM | POA: Insufficient documentation

## 2011-12-18 DIAGNOSIS — R0789 Other chest pain: Secondary | ICD-10-CM

## 2011-12-18 DIAGNOSIS — E119 Type 2 diabetes mellitus without complications: Secondary | ICD-10-CM | POA: Insufficient documentation

## 2011-12-18 DIAGNOSIS — R209 Unspecified disturbances of skin sensation: Secondary | ICD-10-CM | POA: Insufficient documentation

## 2011-12-18 DIAGNOSIS — M25519 Pain in unspecified shoulder: Secondary | ICD-10-CM | POA: Insufficient documentation

## 2011-12-18 DIAGNOSIS — F17200 Nicotine dependence, unspecified, uncomplicated: Secondary | ICD-10-CM | POA: Insufficient documentation

## 2011-12-18 LAB — BASIC METABOLIC PANEL
BUN: 14 mg/dL (ref 6–23)
CO2: 23 mEq/L (ref 19–32)
Calcium: 9.8 mg/dL (ref 8.4–10.5)
Glucose, Bld: 347 mg/dL — ABNORMAL HIGH (ref 70–99)
Sodium: 135 mEq/L (ref 135–145)

## 2011-12-18 LAB — CBC WITH DIFFERENTIAL/PLATELET
Basophils Relative: 0 % (ref 0–1)
Eosinophils Absolute: 0.2 10*3/uL (ref 0.0–0.7)
Eosinophils Relative: 2 % (ref 0–5)
HCT: 45.1 % (ref 39.0–52.0)
Hemoglobin: 14.8 g/dL (ref 13.0–17.0)
Lymphs Abs: 4.1 10*3/uL — ABNORMAL HIGH (ref 0.7–4.0)
MCH: 28.3 pg (ref 26.0–34.0)
MCHC: 32.8 g/dL (ref 30.0–36.0)
MCV: 86.2 fL (ref 78.0–100.0)
Monocytes Absolute: 0.5 10*3/uL (ref 0.1–1.0)
Monocytes Relative: 5 % (ref 3–12)
Neutrophils Relative %: 49 % (ref 43–77)
RBC: 5.23 MIL/uL (ref 4.22–5.81)

## 2011-12-18 MED ORDER — IBUPROFEN 800 MG PO TABS
800.0000 mg | ORAL_TABLET | Freq: Once | ORAL | Status: AC
  Administered 2011-12-18: 800 mg via ORAL
  Filled 2011-12-18: qty 1

## 2011-12-18 MED ORDER — HYDROCODONE-ACETAMINOPHEN 5-325 MG PO TABS
1.0000 | ORAL_TABLET | Freq: Once | ORAL | Status: AC
  Administered 2011-12-18: 1 via ORAL
  Filled 2011-12-18: qty 1

## 2011-12-18 MED ORDER — HYDROCODONE-ACETAMINOPHEN 5-325 MG PO TABS: 1.0000 | ORAL_TABLET | ORAL | Status: AC | PRN

## 2011-12-18 NOTE — ED Provider Notes (Signed)
History     CSN: [id number]  Arrival date & time 12/18/11  0324   None     Chief Complaint  Patient presents with  . Chest Pain    (Consider location/radiation/quality/duration/timing/severity/associated sxs/prior treatment) HPI Shawn Newman is a 41 y.o. male who presents to the Emergency Department complaining of sharp intermittent left sided chest pain that began yesterday associated with left upper back and shoulder discomfort.He has had stabbing pain that goes from his shoulder to his hand associated with numbness and tingling that last several seconds.  He is not aware of any injury or strenuous activity. He has had similar pain before and was seen in the ER. EKG and work up was negative. He denies fever, chills, extremity weakness.  PCP Dr. Mayford Knife  Past Medical History  Diagnosis Date  . Hypertension   . Diabetes mellitus   . Sleep apnea   . Abscess   . Chronic back pain   . Sciatica   . Asthma   . Bronchitis     History reviewed. No pertinent past surgical history.  Family History  Problem Relation Age of Onset  . Hypertension Mother   . Diabetes Mother   . Hypertension Father   . Diabetes Father   . Diabetes Brother   . Hypertension Brother     History  Substance Use Topics  . Smoking status: Current Everyday Smoker -- 0.5 packs/day    Types: Cigarettes  . Smokeless tobacco: Not on file  . Alcohol Use: No      Review of Systems  Constitutional: Negative for fever.       10 Systems reviewed and are negative for acute change except as noted in the HPI.  HENT: Negative for congestion.   Eyes: Negative for discharge and redness.  Respiratory: Negative for cough and shortness of breath.   Cardiovascular: Positive for chest pain.  Gastrointestinal: Negative for vomiting and abdominal pain.  Musculoskeletal: Negative for back pain.       Left shoulder and upper back pain  Skin: Negative for rash.  Neurological: Positive for numbness. Negative for  syncope and headaches.  Psychiatric/Behavioral:       No behavior change.    Allergies  Sulfa antibiotics  Home Medications   Current Outpatient Rx  Name Route Sig Dispense Refill  . ACETAMINOPHEN 500 MG PO TABS Oral Take 1,500 mg by mouth as needed.    . ALBUTEROL SULFATE HFA 108 (90 BASE) MCG/ACT IN AERS Inhalation Inhale 2 puffs into the lungs every 6 (six) hours as needed.    Marland Kitchen AMLODIPINE BESYLATE 10 MG PO TABS Oral Take 10 mg by mouth daily.      . BC HEADACHE PO Oral Take 1 packet by mouth as needed. For pain    . FLUTICASONE-SALMETEROL 250-50 MCG/DOSE IN AEPB Inhalation Inhale 1 puff into the lungs every 12 (twelve) hours.    Marland Kitchen GLIPIZIDE 10 MG PO TABS Oral Take 10 mg by mouth 2 (two) times daily before a meal.      . HYDROCHLOROTHIAZIDE 25 MG PO TABS Oral Take 25 mg by mouth daily.     Marland Kitchen HYDROCODONE-ACETAMINOPHEN 5-325 MG PO TABS Oral Take 1 tablet by mouth every 4 (four) hours as needed. For pain    . METFORMIN HCL 1000 MG PO TABS Oral Take 1,000 mg by mouth 2 (two) times daily with a meal.      . ONE-A-DAY MENS PO Oral Take 1 tablet by mouth daily.    Marland Kitchen  PRAVASTATIN SODIUM 20 MG PO TABS Oral Take 20 mg by mouth every evening.     Marland Kitchen SITAGLIPTIN PHOSPHATE 25 MG PO TABS Oral Take 25 mg by mouth daily.        BP 135/87  Pulse 89  Temp 97.9 F (36.6 C) (Oral)  Resp 18  Ht 6\' 1"  (1.854 m)  Wt 360 lb (163.295 kg)  BMI 47.50 kg/m2  SpO2 97%  Physical Exam  Nursing note and vitals reviewed. Constitutional: He is oriented to person, place, and time. He appears well-developed and well-nourished. No distress.       Awake, alert, nontoxic appearance.  HENT:  Head: Atraumatic.  Eyes: Conjunctivae and EOM are normal. Pupils are equal, round, and reactive to light. Right eye exhibits no discharge. Left eye exhibits no discharge.  Neck: Neck supple.  Cardiovascular: Normal rate, normal heart sounds and intact distal pulses.   Pulmonary/Chest: Effort normal and breath sounds  normal. He exhibits no tenderness.  Abdominal: Soft. Bowel sounds are normal. There is no tenderness. There is no rebound.  Musculoskeletal: Normal range of motion. He exhibits no tenderness.       Baseline ROM, no obvious new focal weakness.FROM to left shoulder. Sensation to light touch intact in left arm and hand. Strength normal. Pulses 2+. Cap refill brisk.   Neurological: He is alert and oriented to person, place, and time.       Mental status and motor strength appears baseline for patient and situation.  Skin: No rash noted.  Psychiatric: He has a normal mood and affect.    ED Course  Procedures (including critical care time) Results for orders placed during the hospital encounter of 12/18/11  CBC WITH DIFFERENTIAL      Component Value Range   WBC 9.3  4.0 - 10.5 K/uL   RBC 5.23  4.22 - 5.81 MIL/uL   Hemoglobin 14.8  13.0 - 17.0 g/dL   HCT 30.8  65.7 - 84.6 %   MCV 86.2  78.0 - 100.0 fL   MCH 28.3  26.0 - 34.0 pg   MCHC 32.8  30.0 - 36.0 g/dL   RDW 96.2  95.2 - 84.1 %   Platelets 268  150 - 400 K/uL   Neutrophils Relative 49  43 - 77 %   Neutro Abs 4.5  1.7 - 7.7 K/uL   Lymphocytes Relative 44  12 - 46 %   Lymphs Abs 4.1 (*) 0.7 - 4.0 K/uL   Monocytes Relative 5  3 - 12 %   Monocytes Absolute 0.5  0.1 - 1.0 K/uL   Eosinophils Relative 2  0 - 5 %   Eosinophils Absolute 0.2  0.0 - 0.7 K/uL   Basophils Relative 0  0 - 1 %   Basophils Absolute 0.0  0.0 - 0.1 K/uL  TROPONIN I      Component Value Range   Troponin I <0.30  <0.30 ng/mL  BASIC METABOLIC PANEL      Component Value Range   Sodium 135  135 - 145 mEq/L   Potassium 3.5  3.5 - 5.1 mEq/L   Chloride 99  96 - 112 mEq/L   CO2 23  19 - 32 mEq/L   Glucose, Bld 347 (*) 70 - 99 mg/dL   BUN 14  6 - 23 mg/dL   Creatinine, Ser 3.24  0.50 - 1.35 mg/dL   Calcium 9.8  8.4 - 40.1 mg/dL   GFR calc non Af Amer >90  >90 mL/min  GFR calc Af Amer >90  >90 mL/min     Date: 12/18/2011  0334  Rate: 85  Rhythm: normal sinus  rhythm  QRS Axis: normal  Intervals: normal  ST/T Wave abnormalities: normal  Conduction Disutrbances: none  Narrative Interpretation: unremarkable  Dg Chest 2 View  12/18/2011  *RADIOLOGY REPORT*  Clinical Data: Chest pain  CHEST - 2 VIEW  Comparison: 12/06/2011  Findings: Linear opacities within the left lung are again noted and most in keeping with scarring or atelectasis.  Heart size remains at the upper normal limits.  Mediastinal contours are unchanged. No pleural effusion or pneumothorax.  No acute osseous finding. Multilevel degenerative change.  IMPRESSION: No interval change.  Linear left lung opacities are most in keeping with scarring or atelectasis.  Original Report Authenticated By: Waneta Martins, M.D.      MDM  Patient with left sided sharp chest pain associated with left upper back and shoulder pain. Labs are unremarkable, troponin is negative, EKG normal, chest xray without acute findings. Patient given an antiinflammatory and analgesic. Pt stable in ED with no significant deterioration in condition.The patient appears reasonably screened and/or stabilized for discharge and I doubt any other medical condition or other [facility] requiring further screening, evaluation, or treatment in the ED at this time prior to discharge.  MDM Reviewed: nursing note and vitals Interpretation: labs, ECG and x-ray           Nicoletta Dress. Colon Branch, MD 12/18/11 2214

## 2011-12-18 NOTE — ED Notes (Signed)
Patient complaining of sharp chest pain to left side of chest. States started this afternoon and is intermittent. States he has also having back pain and that the pain radiates to head and down left arm with intermittent numbness and tingling in left arm/hand.

## 2011-12-18 NOTE — Discharge Instructions (Signed)
Apply heat to the area for comfort. Use Ibuprofen three times a day for 3-5 days. Use the stronger pain medicine along with the ibuprofen as needed for pain.    Chest Wall Pain Chest wall pain is pain in or around the bones and muscles of your chest. It may take up to 6 weeks to get better. It may take longer if you must stay physically active in your work and activities.  CAUSES  Chest wall pain may happen on its own. However, it may be caused by:  A viral illness like the flu.   Injury.   Coughing.   Exercise.   Arthritis.   Fibromyalgia.   Shingles.  HOME CARE INSTRUCTIONS   Avoid overtiring physical activity. Try not to strain or perform activities that cause pain. This includes any activities using your chest or your abdominal and side muscles, especially if heavy weights are used.   Put ice on the sore area.   Put ice in a plastic bag.   Place a towel between your skin and the bag.   Leave the ice on for 15 to 20 minutes per hour while awake for the first 2 days.   Only take over-the-counter or prescription medicines for pain, discomfort, or fever as directed by your caregiver.  SEEK IMMEDIATE MEDICAL CARE IF:   Your pain increases, or you are very uncomfortable.   You have a fever.   Your chest pain becomes worse.   You have new, unexplained symptoms.   You have nausea or vomiting.   You feel sweaty or lightheaded.   You have a cough with phlegm (sputum), or you cough up blood.  MAKE SURE YOU:   Understand these instructions.   Will watch your condition.   Will get help right away if you are not doing well or get worse.  Document Released: 06/05/2005 Document Revised: 05/25/2011 Document Reviewed: 01/30/2011 [facility] Patient Information 2012 [place], [place].

## 2012-01-16 ENCOUNTER — Encounter (HOSPITAL_COMMUNITY): Payer: Self-pay | Admitting: Emergency Medicine

## 2012-01-16 ENCOUNTER — Emergency Department (HOSPITAL_COMMUNITY)
Admission: EM | Admit: 2012-01-16 | Discharge: 2012-01-16 | Disposition: A | Discharge status: Home / Self Care | Payer: Medicaid Other | Attending: Emergency Medicine | Admitting: Emergency Medicine

## 2012-01-16 DIAGNOSIS — M25559 Pain in unspecified hip: Secondary | ICD-10-CM | POA: Insufficient documentation

## 2012-01-16 DIAGNOSIS — E119 Type 2 diabetes mellitus without complications: Secondary | ICD-10-CM | POA: Insufficient documentation

## 2012-01-16 DIAGNOSIS — J45909 Unspecified asthma, uncomplicated: Secondary | ICD-10-CM | POA: Insufficient documentation

## 2012-01-16 DIAGNOSIS — Z79899 Other long term (current) drug therapy: Secondary | ICD-10-CM | POA: Insufficient documentation

## 2012-01-16 DIAGNOSIS — F17200 Nicotine dependence, unspecified, uncomplicated: Secondary | ICD-10-CM | POA: Insufficient documentation

## 2012-01-16 DIAGNOSIS — G8929 Other chronic pain: Secondary | ICD-10-CM | POA: Insufficient documentation

## 2012-01-16 DIAGNOSIS — M25551 Pain in right hip: Secondary | ICD-10-CM

## 2012-01-16 DIAGNOSIS — I10 Essential (primary) hypertension: Secondary | ICD-10-CM | POA: Insufficient documentation

## 2012-01-16 DIAGNOSIS — G4730 Sleep apnea, unspecified: Secondary | ICD-10-CM | POA: Insufficient documentation

## 2012-01-16 MED ORDER — PREDNISONE 20 MG PO TABS: 60.0000 mg | ORAL_TABLET | Freq: Once | ORAL | Status: DC

## 2012-01-16 MED ORDER — OXYCODONE-ACETAMINOPHEN 5-325 MG PO TABS: ORAL_TABLET | ORAL | Status: DC

## 2012-01-16 MED ORDER — PREDNISONE 20 MG PO TABS
60.0000 mg | ORAL_TABLET | Freq: Once | ORAL | Status: AC
  Administered 2012-01-16: 60 mg via ORAL
  Filled 2012-01-16: qty 3

## 2012-01-16 MED ORDER — OXYCODONE-ACETAMINOPHEN 5-325 MG PO TABS
1.0000 | ORAL_TABLET | Freq: Once | ORAL | Status: AC
  Administered 2012-01-16: 1 via ORAL
  Filled 2012-01-16: qty 1

## 2012-01-16 MED ORDER — PREDNISONE 10 MG PO TABS: ORAL_TABLET | ORAL | Status: DC

## 2012-01-16 NOTE — ED Provider Notes (Signed)
History     CSN: [id number]  Arrival date & time 01/16/12  2105   First MD Initiated Contact with Patient 01/16/12 2131      Chief Complaint  Patient presents with  . Leg Pain    (Consider location/radiation/quality/duration/timing/severity/associated sxs/prior treatment) HPI Comments: States with the initial bout of pain his MD in [place] gave him a steroid inject in the R hip or bursa with significant relief.  Denies recent trauma.  No radiation.  Patient is a 41 y.o. male presenting with leg pain. The history is provided by the patient. No language interpreter was used.  Leg Pain  Incident onset: R hip hurting intermittently ~ 8 months. The incident occurred at home. There was no injury mechanism. The pain is present in the right hip. The quality of the pain is described as aching and throbbing. The pain is at a severity of 8/10. The pain has been intermittent since onset. Associated symptoms include inability to bear weight. Pertinent negatives include no numbness. He reports no foreign bodies present. The symptoms are aggravated by bearing weight.    Past Medical History  Diagnosis Date  . Hypertension   . Diabetes mellitus   . Sleep apnea   . Abscess   . Chronic back pain   . Sciatica   . Asthma   . Bronchitis     History reviewed. No pertinent past surgical history.  Family History  Problem Relation Age of Onset  . Hypertension Mother   . Diabetes Mother   . Hypertension Father   . Diabetes Father   . Diabetes Brother   . Hypertension Brother     History  Substance Use Topics  . Smoking status: Current Everyday Smoker -- 0.5 packs/day    Types: Cigarettes  . Smokeless tobacco: Not on file  . Alcohol Use: No      Review of Systems  Constitutional: Negative for fever and chills.  Musculoskeletal:       R hip pain   Neurological: Negative for numbness.  All other systems reviewed and are negative.    Allergies  Sulfa antibiotics  Home Medications     Current Outpatient Rx  Name Route Sig Dispense Refill  . ACETAMINOPHEN 500 MG PO TABS Oral Take 10,000 mg by mouth as needed.    . ALBUTEROL SULFATE HFA 108 (90 BASE) MCG/ACT IN AERS Inhalation Inhale 2 puffs into the lungs every 6 (six) hours as needed.    Marland Kitchen AMLODIPINE BESYLATE 10 MG PO TABS Oral Take 10 mg by mouth daily.      . CHOLINE FENOFIBRATE 45 MG PO CPDR Oral Take 45 mg by mouth daily.    Marland Kitchen FLUTICASONE-SALMETEROL 250-50 MCG/DOSE IN AEPB Inhalation Inhale 1 puff into the lungs every 12 (twelve) hours.    Marland Kitchen GLIPIZIDE 10 MG PO TABS Oral Take 10 mg by mouth 2 (two) times daily before a meal.      . HYDROCHLOROTHIAZIDE 25 MG PO TABS Oral Take 25 mg by mouth daily.     . INSULIN GLARGINE 100 UNIT/ML [place] SOLN Subcutaneous Inject 40 Units into the skin at bedtime.    Marland Kitchen LIRAGLUTIDE 18 MG/3ML [place] SOLN Subcutaneous Inject into the skin every morning. 60 units given every morning    . METFORMIN HCL 1000 MG PO TABS Oral Take 1,000 mg by mouth 2 (two) times daily with a meal.      . PRAVASTATIN SODIUM 20 MG PO TABS Oral Take 20 mg by mouth every evening.     Marland Kitchen  SITAGLIPTIN PHOSPHATE 25 MG PO TABS Oral Take 25 mg by mouth daily.      . OXYCODONE-ACETAMINOPHEN 5-325 MG PO TABS  One tab po q 4-6 hrs prn pain 20 tablet 0  . PREDNISONE 10 MG PO TABS  Taper QD: 6,5,4,3,2,1 21 tablet 0    BP 135/89  Pulse 97  Temp 97.7 F (36.5 C) (Oral)  Resp 20  Ht 6\' 1"  (1.854 m)  Wt 360 lb (163.295 kg)  BMI 47.50 kg/m2  SpO2 97%  Physical Exam  Nursing note and vitals reviewed. Constitutional: He is oriented to person, place, and time. He appears well-developed and well-nourished.  HENT:  Head: Normocephalic and atraumatic.  Eyes: EOM are normal.  Neck: Normal range of motion.  Cardiovascular: Normal rate, regular rhythm, normal heart sounds and intact distal pulses.   Pulmonary/Chest: Effort normal and breath sounds normal. No respiratory distress.  Abdominal: Soft. He exhibits no distension. There is  no tenderness.  Musculoskeletal:       Right hip: He exhibits decreased range of motion. He exhibits no tenderness, no swelling, no crepitus, no deformity and no laceration.       Legs: Neurological: He is alert and oriented to person, place, and time.  Skin: Skin is warm and dry.  Psychiatric: He has a normal mood and affect. Judgment normal.    ED Course  Procedures (including critical care time)  Labs Reviewed - No data to display No results found.   1. Right hip pain       MDM  rx-percocet, 20 rx- prednisone taper,  Ice, crutches F/u with your PCP        Evalina Field, PA 01/16/12 2207

## 2012-01-16 NOTE — ED Provider Notes (Signed)
Medical screening examination/treatment/procedure(s) were performed by non-physician practitioner and as supervising physician I was immediately available for consultation/collaboration.   Charles B. Sheldon, MD 01/16/12 2347 

## 2012-01-16 NOTE — ED Notes (Addendum)
Patient complaining of right leg pain "from hip all the way down" since yesterday. States "I had to have a steroid shot before to make it better."

## 2012-01-16 NOTE — ED Notes (Signed)
Pt c/o right hip and leg pain x2 days. Pt states he had similar symptoms 6 or more months ago and was given a steroid shot which relieved the pain. Pain returned "a couple days ago".

## 2012-02-07 ENCOUNTER — Emergency Department (HOSPITAL_COMMUNITY)
Admission: EM | Admit: 2012-02-07 | Discharge: 2012-02-07 | Disposition: A | Discharge status: Home / Self Care | Payer: Medicaid Other | Attending: Emergency Medicine | Admitting: Emergency Medicine

## 2012-02-07 ENCOUNTER — Emergency Department (HOSPITAL_COMMUNITY): Payer: Medicaid Other

## 2012-02-07 ENCOUNTER — Encounter (HOSPITAL_COMMUNITY): Payer: Self-pay | Admitting: *Deleted

## 2012-02-07 DIAGNOSIS — R070 Pain in throat: Secondary | ICD-10-CM | POA: Insufficient documentation

## 2012-02-07 DIAGNOSIS — Z79899 Other long term (current) drug therapy: Secondary | ICD-10-CM | POA: Insufficient documentation

## 2012-02-07 DIAGNOSIS — F17200 Nicotine dependence, unspecified, uncomplicated: Secondary | ICD-10-CM | POA: Insufficient documentation

## 2012-02-07 DIAGNOSIS — E119 Type 2 diabetes mellitus without complications: Secondary | ICD-10-CM | POA: Insufficient documentation

## 2012-02-07 DIAGNOSIS — R1310 Dysphagia, unspecified: Secondary | ICD-10-CM | POA: Insufficient documentation

## 2012-02-07 DIAGNOSIS — J45909 Unspecified asthma, uncomplicated: Secondary | ICD-10-CM | POA: Insufficient documentation

## 2012-02-07 DIAGNOSIS — I10 Essential (primary) hypertension: Secondary | ICD-10-CM | POA: Insufficient documentation

## 2012-02-07 DIAGNOSIS — R0789 Other chest pain: Secondary | ICD-10-CM | POA: Insufficient documentation

## 2012-02-07 DIAGNOSIS — G4730 Sleep apnea, unspecified: Secondary | ICD-10-CM | POA: Insufficient documentation

## 2012-02-07 LAB — BASIC METABOLIC PANEL
CO2: 25 mEq/L (ref 19–32)
Calcium: 10.2 mg/dL (ref 8.4–10.5)
Glucose, Bld: 400 mg/dL — ABNORMAL HIGH (ref 70–99)
Sodium: 134 mEq/L — ABNORMAL LOW (ref 135–145)

## 2012-02-07 LAB — CBC WITH DIFFERENTIAL/PLATELET
Eosinophils Relative: 1 % (ref 0–5)
HCT: 45.9 % (ref 39.0–52.0)
Hemoglobin: 15.3 g/dL (ref 13.0–17.0)
Lymphocytes Relative: 45 % (ref 12–46)
Lymphs Abs: 3.7 10*3/uL (ref 0.7–4.0)
MCV: 84.4 fL (ref 78.0–100.0)
Monocytes Absolute: 0.4 10*3/uL (ref 0.1–1.0)
Platelets: 236 10*3/uL (ref 150–400)
RBC: 5.44 MIL/uL (ref 4.22–5.81)
WBC: 8.1 10*3/uL (ref 4.0–10.5)

## 2012-02-07 LAB — D-DIMER, QUANTITATIVE: D-Dimer, Quant: 0.27 ug/mL-FEU (ref 0.00–0.48)

## 2012-02-07 MED ORDER — ALBUTEROL SULFATE (5 MG/ML) 0.5% IN NEBU: 2.5000 mg | INHALATION_SOLUTION | Freq: Once | RESPIRATORY_TRACT | Status: DC

## 2012-02-07 MED ORDER — OXYCODONE-ACETAMINOPHEN 5-325 MG PO TABS: 1.0000 | ORAL_TABLET | Freq: Four times a day (QID) | ORAL | Status: AC | PRN

## 2012-02-07 MED ORDER — OXYCODONE-ACETAMINOPHEN 5-325 MG PO TABS
2.0000 | ORAL_TABLET | Freq: Once | ORAL | Status: AC
  Administered 2012-02-07: 2 via ORAL

## 2012-02-07 MED ORDER — ALBUTEROL SULFATE HFA 108 (90 BASE) MCG/ACT IN AERS
2.0000 | INHALATION_SPRAY | RESPIRATORY_TRACT | Status: DC | PRN
  Administered 2012-02-07: 2 via RESPIRATORY_TRACT
  Filled 2012-02-07: qty 6.7

## 2012-02-07 MED ORDER — IPRATROPIUM BROMIDE 0.02 % IN SOLN
RESPIRATORY_TRACT | Status: AC
  Administered 2012-02-07: 0.5 mg
  Filled 2012-02-07: qty 2.5

## 2012-02-07 MED ORDER — IPRATROPIUM BROMIDE 0.02 % IN SOLN
0.5000 mg | Freq: Once | RESPIRATORY_TRACT | Status: AC
  Administered 2012-02-07: 0.5 mg via RESPIRATORY_TRACT
  Filled 2012-02-07: qty 2.5

## 2012-02-07 MED ORDER — HYDROMORPHONE HCL PF 1 MG/ML IJ SOLN: 1.0000 mg | Freq: Once | INTRAMUSCULAR | Status: DC

## 2012-02-07 MED ORDER — OXYCODONE-ACETAMINOPHEN 5-325 MG PO TABS
ORAL_TABLET | ORAL | Status: AC
  Filled 2012-02-07: qty 2

## 2012-02-07 MED ORDER — IPRATROPIUM BROMIDE 0.02 % IN SOLN: 0.5000 mg | Freq: Once | RESPIRATORY_TRACT | Status: DC

## 2012-02-07 MED ORDER — OXYCODONE-ACETAMINOPHEN 5-325 MG PO TABS: 1.0000 | ORAL_TABLET | Freq: Once | ORAL | Status: DC

## 2012-02-07 MED ORDER — ALBUTEROL SULFATE (5 MG/ML) 0.5% IN NEBU
5.0000 mg | INHALATION_SOLUTION | Freq: Once | RESPIRATORY_TRACT | Status: AC
  Administered 2012-02-07: 5 mg via RESPIRATORY_TRACT
  Filled 2012-02-07: qty 0.5

## 2012-02-07 MED ORDER — ALBUTEROL SULFATE (5 MG/ML) 0.5% IN NEBU
INHALATION_SOLUTION | RESPIRATORY_TRACT | Status: AC
  Administered 2012-02-07: 5 mg
  Filled 2012-02-07: qty 1

## 2012-02-07 MED ORDER — ALBUTEROL SULFATE (5 MG/ML) 0.5% IN NEBU: 5.0000 mg | INHALATION_SOLUTION | Freq: Once | RESPIRATORY_TRACT | Status: DC

## 2012-02-07 NOTE — ED Notes (Signed)
MD notified of D Dimer results. Pt wanting to be discharged; Informed pt that MD notified of results and of pending discharge and process

## 2012-02-07 NOTE — ED Notes (Signed)
Pt reports for the past 3 days has had pain in throat when swallows.  Says doesn't feel like something is stuck in throat but feels pain.  Also reports has had cough x 3 days with pain in ribs, back, and chest that is worse with coughing.

## 2012-02-07 NOTE — ED Provider Notes (Addendum)
History   This chart was scribed for Shawn Hutching, MD by Melba Coon. The patient was seen in room APA05/APA05 and the patient's care was started at 4:28PM.    CSN: [id number]  Arrival date & time 02/07/12  1533   First MD Initiated Contact with Patient 02/07/12 1623      Chief Complaint  Patient presents with  . Dysphagia    (Consider location/radiation/quality/duration/timing/severity/associated sxs/prior treatment) The history is provided by the patient. No language interpreter was used.   Shawn Newman is a 41 y.o. male who presents to the Emergency Department complaining of intermittent, moderate to severe dysphagia and throat pain with an onset 3 days ago. Pt states that when he eats and swallows, pain is present. However, pt does not feel the pain when he is not swallowing. Eating aggravates the pain. Pt also c/o back pain, cough, and nasal congestion in triage. No HA, fever, neck pain, rash, CP, SOB, abd pain, n/v/d, dysuria, or extremity pain, edema, weakness, numbness, or tingling. Pt has several risk factors for heart-related issues: Hx of diabetes mellitus, HTN, heart disease, and hypercholesteremia. Pt is also a current everyday smoker. Family Hx of heart stents - father. Pt also has Family Hx of throat cancer - aunt. No other pertinent medical symptoms.  PCP: Dr. Lerry Liner in [place]  Past Medical History  Diagnosis Date  . Hypertension   . Diabetes mellitus   . Sleep apnea   . Abscess   . Chronic back pain   . Sciatica   . Asthma   . Bronchitis     History reviewed. No pertinent past surgical history.  Family History  Problem Relation Age of Onset  . Hypertension Mother   . Diabetes Mother   . Hypertension Father   . Diabetes Father   . Diabetes Brother   . Hypertension Brother     History  Substance Use Topics  . Smoking status: Current Everyday Smoker -- 0.5 packs/day    Types: Cigarettes  . Smokeless tobacco: Not on file  . Alcohol  Use: No      Review of Systems 10 Systems reviewed and all are negative for acute change except as noted in the HPI.   Allergies  Sulfa antibiotics  Home Medications   Current Outpatient Rx  Name Route Sig Dispense Refill  . ACETAMINOPHEN 500 MG PO TABS Oral Take 10,000 mg by mouth as needed.    Marland Kitchen AMLODIPINE BESYLATE 10 MG PO TABS Oral Take 10 mg by mouth daily.      . CHOLINE FENOFIBRATE 45 MG PO CPDR Oral Take 45 mg by mouth daily.    Marland Kitchen FLUTICASONE-SALMETEROL 250-50 MCG/DOSE IN AEPB Inhalation Inhale 1 puff into the lungs every 12 (twelve) hours.    Marland Kitchen GLIPIZIDE 10 MG PO TABS Oral Take 10 mg by mouth 2 (two) times daily before a meal.      . HYDROCHLOROTHIAZIDE 25 MG PO TABS Oral Take 25 mg by mouth daily.     . INSULIN GLARGINE 100 UNIT/ML [place] SOLN Subcutaneous Inject 40 Units into the skin at bedtime.    Marland Kitchen LIRAGLUTIDE 18 MG/3ML [place] SOLN Subcutaneous Inject into the skin every morning. 60 units given every morning    . METFORMIN HCL 1000 MG PO TABS Oral Take 1,000 mg by mouth 2 (two) times daily with a meal.      . PRAVASTATIN SODIUM 20 MG PO TABS Oral Take 20 mg by mouth every evening.     Marland Kitchen SITAGLIPTIN  PHOSPHATE 25 MG PO TABS Oral Take 25 mg by mouth daily.        BP 137/72  Pulse 102  Temp 98.3 F (36.8 C) (Oral)  Resp 20  Ht 6\' 1"  (1.854 m)  Wt 361 lb (163.749 kg)  BMI 47.63 kg/m2  SpO2 99%  Physical Exam  Nursing note and vitals reviewed. Constitutional: He is oriented to person, place, and time. He appears well-developed and well-nourished.       Obese  HENT:  Head: Normocephalic and atraumatic.  Eyes: EOM are normal. Pupils are equal, round, and reactive to light.  Neck: Normal range of motion. Neck supple.  Cardiovascular: Normal rate, normal heart sounds and intact distal pulses.   Pulmonary/Chest: Effort normal and breath sounds normal.  Abdominal: Bowel sounds are normal. He exhibits no distension. There is no tenderness.  Musculoskeletal: Normal range of  motion. He exhibits no edema and no tenderness.  Neurological: He is alert and oriented to person, place, and time. He has normal strength. No cranial nerve deficit or sensory deficit.  Skin: Skin is warm and dry. No rash noted.  Psychiatric: He has a normal mood and affect.    ED Course  Procedures (including critical care time)  DIAGNOSTIC STUDIES: Oxygen Saturation is 99% on room air, normal by my interpretation.    COORDINATION OF CARE:  4:33PM - CXR and blood w/u will be ordered for the pt. Pt is advised to f/u with his gastroenterologist in [place] tomorrow for an EGD. Pt is also advised to manage a healthier lifestyle. 5:50PM - lab and imaging results reviewed; results are unremarkable. Pt ready for d/c.   Labs Reviewed  BASIC METABOLIC PANEL - Abnormal; Notable for the following:    Sodium 134 (*)     Glucose, Bld 400 (*)     All other components within normal limits  RAPID STREP SCREEN  CBC WITH DIFFERENTIAL  TROPONIN I   Dg Chest 2 View  02/07/2012  *RADIOLOGY REPORT*  Clinical Data: Dysphagia, left pectoral pain  CHEST - 2 VIEW  Comparison: 12/18/2011  Findings: Cardiomediastinal silhouette is stable.  No acute infiltrate or pleural effusion.  No pulmonary edema.  Stable linear atelectasis or scarring in the lingula.  IMPRESSION: No active disease.  No significant change.   Original Report Authenticated By: Natasha Mead, M.D.     Date: 02/07/2012  Rate: 96  Rhythm: normal sinus rhythm  QRS Axis: normal  Intervals: normal  ST/T Wave abnormalities: normal  Conduction Disutrbances:right bundle branch block  Narrative Interpretation:   Old EKG Reviewed: none available   No diagnosis found.    MDM  Initial concern was the feeling of something stuck in his throat.  He is able to swallow. Additionally patient complained of chest pain.  History is vague. Initial screening tests were negative. He has a gastroenterology relationship in [place]. He will call him tomorrow to  evaluate his swallowing problems.  I personally performed the services described in this documentation, which was scribed in my presence. The recorded information has been reviewed and considered.   Recheck at 2000:  Alert, no acute distress. Color good. Pulse ox in lower 90s.  No respiratory distress     Shawn Hutching, MD 02/07/12 1821  Shawn Hutching, MD 02/07/12 2009

## 2012-02-07 NOTE — ED Notes (Addendum)
Pt's 02 sat decreased to 89% on room air, placed on 2liters 02 and sat increased to 98%.  NOtified edp.  EDP also aware of bp.

## 2012-02-07 NOTE — ED Notes (Signed)
Pt says feels much better after breathing treatment.  Requesting inhaler to go home with.  Will Notify EDP.

## 2012-02-07 NOTE — ED Notes (Signed)
Pt stable at discharge with ambulatory gait; Pt instructed to follow up with PCP about health issues and educated and encouraged about the importance of follow up. Pt verbalized understanding. Pt instructed not to drive while on pain medication

## 2012-02-07 NOTE — ED Notes (Signed)
Patient states he needs pain medicine and a breathing treatment.

## 2012-02-07 NOTE — ED Notes (Signed)
Pt states that every time he eats the food causes pain when it goes down. Also c/o nasal congestion and non productive cough. Pt also c/o back pain when coughing. C/o worsening pain with coughing. Also c/o wheezing at times.

## 2012-02-23 ENCOUNTER — Emergency Department (HOSPITAL_COMMUNITY): Payer: Medicaid Other

## 2012-02-23 ENCOUNTER — Emergency Department (HOSPITAL_COMMUNITY)
Admission: EM | Admit: 2012-02-23 | Discharge: 2012-02-23 | Disposition: A | Discharge status: Home / Self Care | Payer: Medicaid Other | Attending: Emergency Medicine | Admitting: Emergency Medicine

## 2012-02-23 ENCOUNTER — Encounter (HOSPITAL_COMMUNITY): Payer: Self-pay | Admitting: *Deleted

## 2012-02-23 DIAGNOSIS — F17200 Nicotine dependence, unspecified, uncomplicated: Secondary | ICD-10-CM | POA: Insufficient documentation

## 2012-02-23 DIAGNOSIS — J069 Acute upper respiratory infection, unspecified: Secondary | ICD-10-CM | POA: Insufficient documentation

## 2012-02-23 DIAGNOSIS — G8929 Other chronic pain: Secondary | ICD-10-CM | POA: Insufficient documentation

## 2012-02-23 DIAGNOSIS — E119 Type 2 diabetes mellitus without complications: Secondary | ICD-10-CM | POA: Insufficient documentation

## 2012-02-23 DIAGNOSIS — J45909 Unspecified asthma, uncomplicated: Secondary | ICD-10-CM | POA: Insufficient documentation

## 2012-02-23 DIAGNOSIS — I10 Essential (primary) hypertension: Secondary | ICD-10-CM | POA: Insufficient documentation

## 2012-02-23 DIAGNOSIS — G4730 Sleep apnea, unspecified: Secondary | ICD-10-CM | POA: Insufficient documentation

## 2012-02-23 DIAGNOSIS — Z79899 Other long term (current) drug therapy: Secondary | ICD-10-CM | POA: Insufficient documentation

## 2012-02-23 MED ORDER — ALBUTEROL SULFATE HFA 108 (90 BASE) MCG/ACT IN AERS
2.0000 | INHALATION_SPRAY | RESPIRATORY_TRACT | Status: AC
  Administered 2012-02-23: 2 via RESPIRATORY_TRACT
  Filled 2012-02-23: qty 6.7

## 2012-02-23 MED ORDER — BENZONATATE 100 MG PO CAPS: 100.0000 mg | ORAL_CAPSULE | Freq: Three times a day (TID) | ORAL | Status: DC | PRN

## 2012-02-23 MED ORDER — OXYCODONE-ACETAMINOPHEN 5-325 MG PO TABS
1.0000 | ORAL_TABLET | Freq: Once | ORAL | Status: AC
  Administered 2012-02-23: 1 via ORAL
  Filled 2012-02-23: qty 1

## 2012-02-23 NOTE — ED Provider Notes (Signed)
History     CSN: [id number]  Arrival date & time 02/23/12  1958   First MD Initiated Contact with Patient 02/23/12 2021      Chief Complaint  Patient presents with  . Nasal Congestion  . Cough     HPI Pt was seen at 2100.  Per pt, c/o gradual onset and persistence of constant runny/stffy nose, ears and sinus congestion, cough, generalized body aches and fatigue for the past 2 days.  Denies sore throat, no fevers, no rash, no CP/SOB, no abd pain, no N/V/D.     Past Medical History  Diagnosis Date  . Hypertension   . Diabetes mellitus   . Sleep apnea   . Abscess   . Chronic back pain   . Sciatica   . Asthma   . Bronchitis     History reviewed. No pertinent past surgical history.  Family History  Problem Relation Age of Onset  . Hypertension Mother   . Diabetes Mother   . Hypertension Father   . Diabetes Father   . Diabetes Brother   . Hypertension Brother     History  Substance Use Topics  . Smoking status: Current Everyday Smoker -- 0.5 packs/day    Types: Cigarettes  . Smokeless tobacco: Not on file  . Alcohol Use: No      Review of Systems ROS: Statement: All systems negative except as marked or noted in the HPI; Constitutional: +generalized body aches. Negative for fever and chills. ; ; Eyes: Negative for eye pain, redness and discharge. ; ; ENMT: Negative for sore throat, hoarseness, +nasal congestion, sinus pressure. ; ; Cardiovascular: Negative for chest pain, palpitations, diaphoresis, dyspnea and peripheral edema. ; ; Respiratory: +cough. Negative for wheezing and stridor. ; ; Gastrointestinal: Negative for nausea, vomiting, diarrhea, abdominal pain, blood in stool, hematemesis, jaundice and rectal bleeding. . ; ; Genitourinary: Negative for dysuria, flank pain and hematuria. ; ; Musculoskeletal: Negative for back pain and neck pain. Negative for swelling and trauma.; ; Skin: Negative for pruritus, rash, abrasions, blisters, bruising and skin lesion.; ;  Neuro: Negative for headache, lightheadedness and neck stiffness. Negative for weakness, altered level of consciousness , altered mental status, extremity weakness, paresthesias, involuntary movement, seizure and syncope.       Allergies  Sulfa antibiotics  Home Medications   Current Outpatient Rx  Name Route Sig Dispense Refill  . ACETAMINOPHEN 500 MG PO TABS Oral Take 10,000 mg by mouth every 6 (six) hours as needed. pain    . AMLODIPINE BESYLATE 10 MG PO TABS Oral Take 10 mg by mouth daily.      . CHOLINE FENOFIBRATE 45 MG PO CPDR Oral Take 45 mg by mouth daily.    Marland Kitchen FLUTICASONE-SALMETEROL 250-50 MCG/DOSE IN AEPB Inhalation Inhale 1 puff into the lungs every 12 (twelve) hours.    Marland Kitchen GLIPIZIDE 10 MG PO TABS Oral Take 10 mg by mouth 2 (two) times daily before a meal.      . HYDROCHLOROTHIAZIDE 25 MG PO TABS Oral Take 25 mg by mouth daily.     . INSULIN GLARGINE 100 UNIT/ML [place] SOLN Subcutaneous Inject 40 Units into the skin at bedtime.    Marland Kitchen LIRAGLUTIDE 18 MG/3ML [place] SOLN Subcutaneous Inject into the skin every morning. 60 units given every morning    . METFORMIN HCL 1000 MG PO TABS Oral Take 1,000 mg by mouth 2 (two) times daily with a meal.      . PRAVASTATIN SODIUM 20 MG PO TABS  Oral Take 20 mg by mouth every evening.     Marland Kitchen SITAGLIPTIN PHOSPHATE 25 MG PO TABS Oral Take 25 mg by mouth daily.        BP 147/121  Pulse 80  Temp 98.5 F (36.9 C) (Oral)  Resp 20  Ht 6\' 1"  (1.854 m)  Wt 355 lb (161.027 kg)  BMI 46.84 kg/m2  SpO2 98%  Physical Exam 2105: Physical examination:  Nursing notes reviewed; Vital signs and O2 SAT reviewed;  Constitutional: Well developed, Well nourished, Well hydrated, In no acute distress; Head:  Normocephalic, atraumatic; Eyes: EOMI, PERRL, No scleral icterus; ENMT: TM's clear bilat. +edemetous nasal turbinates bilat with clear rhinorrhea.  Mouth and pharynx normal, Mucous membranes moist; Neck: Supple, Full range of motion, No lymphadenopathy;  Cardiovascular: Regular rate and rhythm, No murmur, rub, or gallop; Respiratory: Breath sounds clear & equal bilaterally, No rales, rhonchi, wheezes.  Speaking full sentences with ease, Normal respiratory effort/excursion; Chest: Nontender, Movement normal; Abdomen: Soft, Nontender, Nondistended, Normal bowel sounds;; Extremities: Pulses normal, No tenderness, No edema, No calf edema or asymmetry.; Neuro: AA&Ox3, Major CN grossly intact.  Speech clear. No gross focal motor or sensory deficits in extremities.; Skin: Color normal, Warm, Dry.   ED Course  Procedures   MDM  MDM Reviewed: nursing note, vitals and previous chart Interpretation: x-ray   Dg Chest 2 View 02/23/2012  *RADIOLOGY REPORT*  Clinical Data: Cough and chest congestion.  Asthma.  Sleep apnea.  CHEST - 2 VIEW  Comparison: 02/09/2012  Findings: Mild scarring again seen in the peripheral left lung. Lungs otherwise clear.  No evidence of pleural effusion.  Heart size and mediastinal contours are normal.  IMPRESSION: Stable mild left lung scarring.  No active disease.   Original Report Authenticated By: Danae Orleans, M.D.      2135:  Appears URI at this time, will tx symptomatically.  Dx testing d/w pt and family.  Questions answered.  Verb understanding, agreeable to d/c home with outpt f/u.   Laray Anger, DO 02/26/12 [phone]

## 2012-02-23 NOTE — ED Notes (Addendum)
Pt c/o congestion and cough x 1 day. Pt also c/o generalized body aches.

## 2012-03-02 ENCOUNTER — Encounter (HOSPITAL_COMMUNITY): Payer: Self-pay | Admitting: Emergency Medicine

## 2012-03-02 ENCOUNTER — Emergency Department (HOSPITAL_COMMUNITY)
Admission: EM | Admit: 2012-03-02 | Discharge: 2012-03-02 | Disposition: A | Discharge status: Home / Self Care | Payer: Medicaid Other | Attending: Emergency Medicine | Admitting: Emergency Medicine

## 2012-03-02 DIAGNOSIS — G8929 Other chronic pain: Secondary | ICD-10-CM | POA: Insufficient documentation

## 2012-03-02 DIAGNOSIS — J45909 Unspecified asthma, uncomplicated: Secondary | ICD-10-CM | POA: Insufficient documentation

## 2012-03-02 DIAGNOSIS — Z794 Long term (current) use of insulin: Secondary | ICD-10-CM | POA: Insufficient documentation

## 2012-03-02 DIAGNOSIS — M5430 Sciatica, unspecified side: Secondary | ICD-10-CM | POA: Insufficient documentation

## 2012-03-02 DIAGNOSIS — B349 Viral infection, unspecified: Secondary | ICD-10-CM

## 2012-03-02 DIAGNOSIS — F17200 Nicotine dependence, unspecified, uncomplicated: Secondary | ICD-10-CM | POA: Insufficient documentation

## 2012-03-02 DIAGNOSIS — G4730 Sleep apnea, unspecified: Secondary | ICD-10-CM | POA: Insufficient documentation

## 2012-03-02 DIAGNOSIS — Z882 Allergy status to sulfonamides status: Secondary | ICD-10-CM | POA: Insufficient documentation

## 2012-03-02 DIAGNOSIS — J029 Acute pharyngitis, unspecified: Secondary | ICD-10-CM | POA: Insufficient documentation

## 2012-03-02 DIAGNOSIS — I10 Essential (primary) hypertension: Secondary | ICD-10-CM | POA: Insufficient documentation

## 2012-03-02 DIAGNOSIS — B9789 Other viral agents as the cause of diseases classified elsewhere: Secondary | ICD-10-CM | POA: Insufficient documentation

## 2012-03-02 DIAGNOSIS — E119 Type 2 diabetes mellitus without complications: Secondary | ICD-10-CM | POA: Insufficient documentation

## 2012-03-02 LAB — RAPID STREP SCREEN (MED CTR MEBANE ONLY): Streptococcus, Group A Screen (Direct): NEGATIVE

## 2012-03-02 MED ORDER — MAGIC MOUTHWASH W/LIDOCAINE: 5.0000 mL | Freq: Three times a day (TID) | ORAL | Status: DC | PRN

## 2012-03-02 MED ORDER — HYDROCODONE-ACETAMINOPHEN 5-325 MG PO TABS: 1.0000 | ORAL_TABLET | ORAL | Status: AC | PRN

## 2012-03-02 MED ORDER — HYDROCODONE-ACETAMINOPHEN 5-325 MG PO TABS
2.0000 | ORAL_TABLET | Freq: Once | ORAL | Status: AC
  Administered 2012-03-02: 2 via ORAL
  Filled 2012-03-02: qty 2

## 2012-03-02 NOTE — ED Notes (Signed)
Pt reports coming in a few weeks ago and was seen for a cold. Pt reports no relief. Pt reports his throat is sore, mouth and upper lip swollen. Upon assessment pt has sores on the inside of his mouth. Pt denies any shortness of breath or difficulty swallowing. Pt airway patent.

## 2012-03-02 NOTE — ED Notes (Signed)
Two round, reddened areas apporx. 1-2cm  On right and left of soft palate. States this burns and stings with swallowing.  C/O headache that has not gotten better since last visit on 02/23/12.  No tenderness noted to sinus palpation, but pt. Reports stuffy nose and "full" feeling in head.  Does not get anything out when he blows his nose.

## 2012-03-02 NOTE — ED Provider Notes (Signed)
Medical screening examination/treatment/procedure(s) were conducted as a shared visit with non-physician practitioner(s) and myself.  I personally evaluated the patient during the encounter  Pt well appearing, uvula midline, voice normal, stable for d/c BP 128/68  Pulse 96  Temp 98 F (36.7 C) (Oral)  Resp 20  Ht 6\' 1"  (1.854 m)  Wt 355 lb (161.027 kg)  BMI 46.84 kg/m2  SpO2 97%   Joya Gaskins, MD 03/02/12 1534

## 2012-03-02 NOTE — ED Notes (Signed)
Patient with no complaints at this time. Respirations even and unlabored. Skin warm/dry. Discharge instructions reviewed with patient at this time. Patient given opportunity to voice concerns/ask questions. IV removed per policy and band-aid applied to site. Patient discharged at this time and left Emergency Department with steady gait.  

## 2012-03-02 NOTE — ED Provider Notes (Signed)
History     CSN: [id number]  Arrival date & time 03/02/12  1122   First MD Initiated Contact with Patient 03/02/12 1137      Chief Complaint  Patient presents with  . Sore Throat    HPI Shawn Newman is a 41 y.o. male who presents to the ED with sore throat. He was evaluated here 02/23/12 for URI. Patient states that he has continued to have cough and congestion but now very sore throat and blister areas on roof of mouth. The history was provided by the patient.  Past Medical History  Diagnosis Date  . Hypertension   . Diabetes mellitus   . Sleep apnea   . Abscess   . Chronic back pain   . Sciatica   . Asthma   . Bronchitis     History reviewed. No pertinent past surgical history.  Family History  Problem Relation Age of Onset  . Hypertension Mother   . Diabetes Mother   . Hypertension Father   . Diabetes Father   . Diabetes Brother   . Hypertension Brother     History  Substance Use Topics  . Smoking status: Current Every Day Smoker -- 0.5 packs/day    Types: Cigarettes  . Smokeless tobacco: Not on file  . Alcohol Use: No      Review of Systems  Constitutional: Negative for fever, chills, diaphoresis and fatigue.  HENT: Positive for ear pain, congestion, sore throat, sneezing, mouth sores and neck pain. Negative for facial swelling, neck stiffness, dental problem and sinus pressure.   Eyes: Negative for photophobia, pain and discharge.  Respiratory: Positive for cough and wheezing. Negative for chest tightness.   Cardiovascular: Negative for chest pain and palpitations.  Gastrointestinal: Negative for nausea, vomiting, abdominal pain, diarrhea, constipation and abdominal distention.  Genitourinary: Positive for frequency (diabetic). Negative for dysuria, flank pain and difficulty urinating.  Musculoskeletal: Positive for myalgias and back pain. Negative for gait problem.  Skin: Negative for color change and rash.  Neurological: Positive for headaches.  Negative for dizziness, speech difficulty, weakness, light-headedness and numbness.  Psychiatric/Behavioral: Negative for confusion and agitation. The patient is not nervous/anxious.     Allergies  Sulfa antibiotics  Home Medications   Current Outpatient Rx  Name Route Sig Dispense Refill  . ACETAMINOPHEN 500 MG PO TABS Oral Take 1,000 mg by mouth every 6 (six) hours as needed. pain    . AMLODIPINE BESYLATE 10 MG PO TABS Oral Take 10 mg by mouth daily.      Marland Kitchen BENZONATATE 100 MG PO CAPS Oral Take 100 mg by mouth 3 (three) times daily as needed. For cough    . CHOLINE FENOFIBRATE 45 MG PO CPDR Oral Take 45 mg by mouth daily.    Marland Kitchen FLUTICASONE-SALMETEROL 250-50 MCG/DOSE IN AEPB Inhalation Inhale 1 puff into the lungs every 12 (twelve) hours.    Marland Kitchen GLIPIZIDE 10 MG PO TABS Oral Take 10 mg by mouth 2 (two) times daily before a meal.      . HYDROCHLOROTHIAZIDE 25 MG PO TABS Oral Take 25 mg by mouth daily.     . INSULIN GLARGINE 100 UNIT/ML [place] SOLN Subcutaneous Inject 40 Units into the skin at bedtime.    Marland Kitchen LIRAGLUTIDE 18 MG/3ML  SOLN Subcutaneous Inject into the skin every morning. 60 units given every morning    . METFORMIN HCL 1000 MG PO TABS Oral Take 1,000 mg by mouth 2 (two) times daily with a meal.      .  PRAVASTATIN SODIUM 20 MG PO TABS Oral Take 20 mg by mouth every evening.     Marland Kitchen SITAGLIPTIN PHOSPHATE 25 MG PO TABS Oral Take 25 mg by mouth daily.        BP 128/68  Pulse 96  Temp 98 F (36.7 C) (Oral)  Resp 20  Ht 6\' 1"  (1.854 m)  Wt 355 lb (161.027 kg)  BMI 46.84 kg/m2  SpO2 97%  Physical Exam  Nursing note and vitals reviewed. Constitutional: He is oriented to person, place, and time. He appears well-developed and well-nourished. No distress.       obese  HENT:  Head: Normocephalic and atraumatic.  Mouth/Throat: Uvula is midline. Oral lesions present. Posterior oropharyngeal erythema present.       Multiple ulcer lesions posterior pharynx and hard palate.   Eyes: EOM are  normal. Pupils are equal, round, and reactive to light.  Neck: Neck supple.  Cardiovascular: Normal rate and regular rhythm.   Pulmonary/Chest: Effort normal. No respiratory distress. He has no wheezes.  Abdominal: Soft. There is no tenderness.       obese  Musculoskeletal: Normal range of motion. He exhibits no edema.  Lymphadenopathy:    He has cervical adenopathy.  Neurological: He is alert and oriented to person, place, and time. No cranial nerve deficit.  Skin: Skin is warm and dry.  Psychiatric: He has a normal mood and affect. His behavior is normal. Judgment and thought content normal.   Assessment: 41 y.o. male with oral ulcers    Plan:  Pain management   Follow up with PCP, return as needed  ED Course: Dr. Bebe Shaggy in to examine patient.  Procedures Discussed with the patient and all questioned fully answered. He will follow up with his doctor or return here if any problems arise.   Medication List     As of 03/02/2012 12:50 PM    START taking these medications         HYDROcodone-acetaminophen 5-325 MG per tablet   Commonly known as: NORCO/VICODIN   Take 1 tablet by mouth every 4 (four) hours as needed for pain.      magic mouthwash w/lidocaine Soln   Take 5 mLs by mouth 3 (three) times daily as needed.      ASK your doctor about these medications         amLODipine 10 MG tablet   Commonly known as: NORVASC      benzonatate 100 MG capsule   Commonly known as: TESSALON      Fluticasone-Salmeterol 250-50 MCG/DOSE Aepb   Commonly known as: ADVAIR      glipiZIDE 10 MG tablet   Commonly known as: GLUCOTROL      hydrochlorothiazide 25 MG tablet   Commonly known as: HYDRODIURIL      LANTUS SOLOSTAR 100 UNIT/ML injection   Generic drug: insulin glargine      metFORMIN 1000 MG tablet   Commonly known as: GLUCOPHAGE      pravastatin 20 MG tablet   Commonly known as: PRAVACHOL      sitaGLIPtin 25 MG tablet   Commonly known as: JANUVIA      TRILIPIX 45  MG capsule   Generic drug: [place] Fenofibrate      TYLENOL EX ST ARTHRITIS PAIN 500 MG tablet   Generic drug: acetaminophen      VICTOZA 18 MG/3ML Soln   Generic drug: Liraglutide          Where to get your medications  These are the prescriptions that you need to pick up.   You may get these medications from any pharmacy.         HYDROcodone-acetaminophen 5-325 MG per tablet   magic mouthwash w/lidocaine [facility] Orlene Och, NP 03/02/12 1250

## 2012-03-20 ENCOUNTER — Emergency Department (HOSPITAL_COMMUNITY)
Admission: EM | Admit: 2012-03-20 | Discharge: 2012-03-20 | Disposition: A | Discharge status: Home / Self Care | Payer: Medicaid Other | Attending: Emergency Medicine | Admitting: Emergency Medicine

## 2012-03-20 ENCOUNTER — Emergency Department (HOSPITAL_COMMUNITY): Payer: Medicaid Other

## 2012-03-20 ENCOUNTER — Encounter (HOSPITAL_COMMUNITY): Payer: Self-pay | Admitting: *Deleted

## 2012-03-20 DIAGNOSIS — E119 Type 2 diabetes mellitus without complications: Secondary | ICD-10-CM | POA: Insufficient documentation

## 2012-03-20 DIAGNOSIS — R071 Chest pain on breathing: Secondary | ICD-10-CM | POA: Insufficient documentation

## 2012-03-20 DIAGNOSIS — J45901 Unspecified asthma with (acute) exacerbation: Secondary | ICD-10-CM | POA: Insufficient documentation

## 2012-03-20 DIAGNOSIS — R0789 Other chest pain: Secondary | ICD-10-CM

## 2012-03-20 DIAGNOSIS — R062 Wheezing: Secondary | ICD-10-CM | POA: Insufficient documentation

## 2012-03-20 DIAGNOSIS — I10 Essential (primary) hypertension: Secondary | ICD-10-CM | POA: Insufficient documentation

## 2012-03-20 LAB — GLUCOSE, CAPILLARY: Glucose-Capillary: 243 mg/dL — ABNORMAL HIGH (ref 70–99)

## 2012-03-20 MED ORDER — PREDNISONE 20 MG PO TABS
60.0000 mg | ORAL_TABLET | Freq: Once | ORAL | Status: AC
  Administered 2012-03-20: 60 mg via ORAL
  Filled 2012-03-20: qty 3

## 2012-03-20 MED ORDER — OXYCODONE-ACETAMINOPHEN 5-325 MG PO TABS
1.0000 | ORAL_TABLET | Freq: Once | ORAL | Status: AC
  Administered 2012-03-20: 1 via ORAL
  Filled 2012-03-20: qty 1

## 2012-03-20 MED ORDER — PREDNISONE 50 MG PO TABS: 50.0000 mg | ORAL_TABLET | Freq: Every day | ORAL | Status: DC

## 2012-03-20 MED ORDER — ALBUTEROL SULFATE (5 MG/ML) 0.5% IN NEBU
2.5000 mg | INHALATION_SOLUTION | Freq: Once | RESPIRATORY_TRACT | Status: AC
  Administered 2012-03-20: 2.5 mg via RESPIRATORY_TRACT
  Filled 2012-03-20: qty 0.5

## 2012-03-20 MED ORDER — IPRATROPIUM BROMIDE 0.02 % IN SOLN
0.5000 mg | Freq: Once | RESPIRATORY_TRACT | Status: AC
  Administered 2012-03-20: 0.5 mg via RESPIRATORY_TRACT
  Filled 2012-03-20: qty 2.5

## 2012-03-20 MED ORDER — OXYCODONE-ACETAMINOPHEN 5-325 MG PO TABS: 1.0000 | ORAL_TABLET | ORAL | Status: DC | PRN

## 2012-03-20 NOTE — ED Notes (Signed)
Discharge instructions reviewed with pt, questions answered. Pt verbalized understanding.  

## 2012-03-20 NOTE — ED Provider Notes (Signed)
History     CSN: [id number]  Arrival date & time 03/20/12  1305   First MD Initiated Contact with Patient 03/20/12 1345      Chief Complaint  Patient presents with  . Wheezing    (Consider location/radiation/quality/duration/timing/severity/associated sxs/prior treatment) Patient is a 41 y.o. male presenting with wheezing. The history is provided by the patient.  Wheezing  Associated symptoms include wheezing.  He had been treated for bronchitis 3 weeks ago and had been doing well and still 2 days ago when he started having recurrence of a nonproductive cough, wheezing, and dyspnea. He has been using albuterol which has given him temporary relief. He has been having sweats, but denies fever or chills. He is having soreness in his back and chest related to coughing and this pain is moderately severe air. He rates the pain at 9/10. There's been no nausea or vomiting.  Past Medical History  Diagnosis Date  . Hypertension   . Diabetes mellitus   . Sleep apnea   . Abscess   . Chronic back pain   . Sciatica   . Asthma   . Bronchitis     History reviewed. No pertinent past surgical history.  Family History  Problem Relation Age of Onset  . Hypertension Mother   . Diabetes Mother   . Hypertension Father   . Diabetes Father   . Diabetes Brother   . Hypertension Brother     History  Substance Use Topics  . Smoking status: Current Every Day Smoker -- 0.5 packs/day    Types: Cigarettes  . Smokeless tobacco: Not on file  . Alcohol Use: No      Review of Systems  Respiratory: Positive for wheezing.   All other systems reviewed and are negative.    Allergies  Sulfa antibiotics  Home Medications   Current Outpatient Rx  Name Route Sig Dispense Refill  . ACETAMINOPHEN 500 MG PO TABS Oral Take 1,000 mg by mouth every 6 (six) hours as needed. pain    . MAGIC MOUTHWASH W/LIDOCAINE Oral Take 5 mLs by mouth 3 (three) times daily as needed. 120 mL 0  . AMLODIPINE  BESYLATE 10 MG PO TABS Oral Take 10 mg by mouth daily.      Marland Kitchen BENZONATATE 100 MG PO CAPS Oral Take 100 mg by mouth 3 (three) times daily as needed. For cough    . CHOLINE FENOFIBRATE 45 MG PO CPDR Oral Take 45 mg by mouth daily.    Marland Kitchen FLUTICASONE-SALMETEROL 250-50 MCG/DOSE IN AEPB Inhalation Inhale 1 puff into the lungs every 12 (twelve) hours.    Marland Kitchen GLIPIZIDE 10 MG PO TABS Oral Take 10 mg by mouth 2 (two) times daily before a meal.      . HYDROCHLOROTHIAZIDE 25 MG PO TABS Oral Take 25 mg by mouth daily.     . INSULIN GLARGINE 100 UNIT/ML [place] SOLN Subcutaneous Inject 40 Units into the skin at bedtime.    Marland Kitchen LIRAGLUTIDE 18 MG/3ML [place] SOLN Subcutaneous Inject into the skin every morning. 60 units given every morning    . METFORMIN HCL 1000 MG PO TABS Oral Take 1,000 mg by mouth 2 (two) times daily with a meal.      . PRAVASTATIN SODIUM 20 MG PO TABS Oral Take 20 mg by mouth every evening.     Marland Kitchen SITAGLIPTIN PHOSPHATE 25 MG PO TABS Oral Take 25 mg by mouth daily.        BP 138/86  Pulse 107  Temp  98.3 F (36.8 C) (Oral)  Resp 24  Ht 6\' 1"  (1.854 m)  Wt 367 lb (166.47 kg)  BMI 48.42 kg/m2  SpO2 94%  Physical Exam  Nursing note and vitals reviewed. 41year old male, resting comfortably and in no acute distress. He is obese. Vital signs are significant for tachycardia with heart rate of 107, tachypnea with respiratory rate of 24. Oxygen saturation is 94%, which is normal. Head is normocephalic and atraumatic. PERRLA, EOMI. Oropharynx is clear. Neck is nontender and supple without adenopathy or JVD. Back is nontender and there is no CVA tenderness. Lungs have diffuse inspiratory and expiratory wheezes without rales or rhonchi. Chest is nontender. Heart has regular rate and rhythm without murmur. Abdomen is soft, flat, nontender without masses or hepatosplenomegaly and peristalsis is normoactive. Extremities have no cyanosis or edema, full range of motion is present. Skin is warm and dry without  rash. Neurologic: Mental status is normal, cranial nerves are intact, there are no motor or sensory deficits.   ED Course  Procedures (including critical care time)  Dg Chest 2 View  03/20/2012  *RADIOLOGY REPORT*  Clinical Data: Wheezing and asthma.  CHEST - 2 VIEW  Comparison: 03/20/2012  Findings: Two views of the chest were obtained.  There are linear densities along the periphery of the left lung that are suggestive for atelectasis.  No focal airspace disease. Heart and mediastinum are within normal limits.  Trachea is midline and no evidence for pleural effusions.  IMPRESSION: Linear densities in the left lung are suggestive for atelectasis. No evidence for airspace disease or edema.   Original Report Authenticated By: Richarda Overlie, M.D.      1. Asthma exacerbation   2. Chest wall pain       MDM  Exacerbation of asthma. He will need steroids to keep this under control. He will need to monitor his blood sugar closely. In the ED, chest x-ray will be obtained to rule out pneumonia, and he will be given initial dose of prednisone and will be given an albuterol with Atrovent nebulizer treatment. Prior records are reviewed and she was seen here on September 14 for wheezing.  Following treatment, his lungs are completely clear. He been given a dose of Percocet for pain. He is discharged with prescription for prednisone, and Percocet. He is to discuss with his PCP whether he should be put on a steroid inhaler.        Dione Booze, MD 03/20/12 [phone]

## 2012-03-20 NOTE — ED Notes (Signed)
Wheezing x 2 days

## 2012-03-24 ENCOUNTER — Emergency Department (HOSPITAL_COMMUNITY): Payer: Medicaid Other

## 2012-03-24 ENCOUNTER — Emergency Department (HOSPITAL_COMMUNITY)
Admission: EM | Admit: 2012-03-24 | Discharge: 2012-03-25 | Disposition: A | Discharge status: Home / Self Care | Payer: Medicaid Other | Attending: Emergency Medicine | Admitting: Emergency Medicine

## 2012-03-24 ENCOUNTER — Encounter (HOSPITAL_COMMUNITY): Payer: Self-pay

## 2012-03-24 DIAGNOSIS — R0602 Shortness of breath: Secondary | ICD-10-CM | POA: Insufficient documentation

## 2012-03-24 DIAGNOSIS — Z79899 Other long term (current) drug therapy: Secondary | ICD-10-CM | POA: Insufficient documentation

## 2012-03-24 DIAGNOSIS — E669 Obesity, unspecified: Secondary | ICD-10-CM | POA: Insufficient documentation

## 2012-03-24 DIAGNOSIS — E119 Type 2 diabetes mellitus without complications: Secondary | ICD-10-CM | POA: Insufficient documentation

## 2012-03-24 DIAGNOSIS — R059 Cough, unspecified: Secondary | ICD-10-CM | POA: Insufficient documentation

## 2012-03-24 DIAGNOSIS — R0789 Other chest pain: Secondary | ICD-10-CM | POA: Insufficient documentation

## 2012-03-24 DIAGNOSIS — IMO0002 Reserved for concepts with insufficient information to code with codable children: Secondary | ICD-10-CM | POA: Insufficient documentation

## 2012-03-24 DIAGNOSIS — R05 Cough: Secondary | ICD-10-CM | POA: Insufficient documentation

## 2012-03-24 DIAGNOSIS — J45901 Unspecified asthma with (acute) exacerbation: Secondary | ICD-10-CM | POA: Insufficient documentation

## 2012-03-24 DIAGNOSIS — I10 Essential (primary) hypertension: Secondary | ICD-10-CM | POA: Insufficient documentation

## 2012-03-24 DIAGNOSIS — Z794 Long term (current) use of insulin: Secondary | ICD-10-CM | POA: Insufficient documentation

## 2012-03-24 DIAGNOSIS — L02412 Cutaneous abscess of left axilla: Secondary | ICD-10-CM

## 2012-03-24 MED ORDER — LIDOCAINE HCL (PF) 1 % IJ SOLN
5.0000 mL | Freq: Once | INTRAMUSCULAR | Status: AC
  Administered 2012-03-24: 5 mL via INTRADERMAL
  Filled 2012-03-24: qty 5

## 2012-03-24 MED ORDER — ALBUTEROL SULFATE (5 MG/ML) 0.5% IN NEBU
5.0000 mg | INHALATION_SOLUTION | Freq: Once | RESPIRATORY_TRACT | Status: AC
  Administered 2012-03-24: 5 mg via RESPIRATORY_TRACT
  Filled 2012-03-24: qty 1

## 2012-03-24 MED ORDER — IPRATROPIUM BROMIDE 0.02 % IN SOLN
0.5000 mg | Freq: Once | RESPIRATORY_TRACT | Status: AC
  Administered 2012-03-24: 0.5 mg via RESPIRATORY_TRACT
  Filled 2012-03-24: qty 2.5

## 2012-03-24 MED ORDER — OXYCODONE-ACETAMINOPHEN 5-325 MG PO TABS
1.0000 | ORAL_TABLET | Freq: Once | ORAL | Status: AC
  Administered 2012-03-24: 1 via ORAL
  Filled 2012-03-24: qty 1

## 2012-03-24 NOTE — ED Provider Notes (Signed)
History     CSN: [id number]  Arrival date & time 03/24/12  2209   First MD Initiated Contact with Patient 03/24/12 2254      Chief Complaint  Patient presents with  . Abscess  . Cough  . Shortness of Breath    (Consider location/radiation/quality/duration/timing/severity/associated sxs/prior treatment) HPI Comments: Patient c/o wheezing, cough and shortness of breath for one month.  States he was seen here 4 days ago for same.  given prednisone w/o relief.  States that he uses an inhaler and nebulizer, but has not used it regularly.  States the wheezing and cough improves after the neb treatment, but symptoms return if he goes outside or becomes "too hot".  He denies chest pain, abdominal pain, diaphoresis, or vomiting.    Patient also c/o draining abscess to the left axilla for several days.    Patient is a 41 y.o. male presenting with cough. The history is provided by the patient.  Cough This is a chronic problem. The current episode started more than 1 week ago. The problem occurs every few minutes. The problem has not changed since onset.The cough is non-productive. There has been no fever. Associated symptoms include shortness of breath and wheezing. Pertinent negatives include no chest pain, no chills, no ear pain, no headaches, no rhinorrhea, no sore throat and no myalgias. Treatments tried: albuterol, prednisone. The treatment provided no relief. Smoker: stopped smoking one month ago. His past medical history is significant for bronchitis and asthma.    Past Medical History  Diagnosis Date  . Hypertension   . Diabetes mellitus   . Sleep apnea   . Abscess   . Chronic back pain   . Sciatica   . Asthma   . Bronchitis     History reviewed. No pertinent past surgical history.  Family History  Problem Relation Age of Onset  . Hypertension Mother   . Diabetes Mother   . Hypertension Father   . Diabetes Father   . Diabetes Brother   . Hypertension Brother     History    Substance Use Topics  . Smoking status: Current Every Day Smoker -- 0.5 packs/day    Types: Cigarettes  . Smokeless tobacco: Not on file  . Alcohol Use: No      Review of Systems  Constitutional: Negative for fever, chills, activity change and appetite change.  HENT: Negative for ear pain, sore throat and rhinorrhea.   Respiratory: Positive for cough, chest tightness, shortness of breath and wheezing. Negative for stridor.   Cardiovascular: Negative for chest pain.  Gastrointestinal: Negative for nausea, vomiting and abdominal pain.  Musculoskeletal: Negative for myalgias, joint swelling and arthralgias.  Skin: Positive for color change.       Abscess   Neurological: Negative for headaches.  Hematological: Negative for adenopathy.  All other systems reviewed and are negative.    Allergies  Sulfa antibiotics  Home Medications   Current Outpatient Rx  Name Route Sig Dispense Refill  . ACETAMINOPHEN 500 MG PO TABS Oral Take 1,000 mg by mouth every 6 (six) hours as needed. pain    . AMLODIPINE BESYLATE 10 MG PO TABS Oral Take 10 mg by mouth daily.      . CHOLINE FENOFIBRATE 45 MG PO CPDR Oral Take 45 mg by mouth daily.    Marland Kitchen FLUTICASONE-SALMETEROL 250-50 MCG/DOSE IN AEPB Inhalation Inhale 1 puff into the lungs every 12 (twelve) hours.    Marland Kitchen GLIPIZIDE 10 MG PO TABS Oral Take 10 mg by  mouth 2 (two) times daily before a meal.      . HYDROCHLOROTHIAZIDE 25 MG PO TABS Oral Take 25 mg by mouth daily.     . INSULIN GLARGINE 100 UNIT/ML [place] SOLN Subcutaneous Inject 40 Units into the skin at bedtime.    Marland Kitchen LIRAGLUTIDE 18 MG/3ML [place] SOLN Subcutaneous Inject into the skin every morning. 60 units given every morning    . METFORMIN HCL 1000 MG PO TABS Oral Take 1,000 mg by mouth 2 (two) times daily with a meal.      . PRAVASTATIN SODIUM 20 MG PO TABS Oral Take 20 mg by mouth every evening.     Marland Kitchen PREDNISONE 50 MG PO TABS Oral Take 1 tablet (50 mg total) by mouth daily. 5 tablet 0  . SITAGLIPTIN  PHOSPHATE 25 MG PO TABS Oral Take 25 mg by mouth daily.        BP 151/95  Pulse 80  Temp 98 F (36.7 C) (Oral)  Resp 20  Ht 6\' 1"  (1.854 m)  Wt 360 lb (163.295 kg)  BMI 47.50 kg/m2  SpO2 98%  Physical Exam  Nursing note and vitals reviewed. Constitutional: He is oriented to person, place, and time. He appears well-developed and well-nourished. No distress.       obese  HENT:  Head: Normocephalic and atraumatic.  Right Ear: Tympanic membrane and ear canal normal.  Left Ear: Tympanic membrane and ear canal normal.  Mouth/Throat: Uvula is midline, oropharynx is clear and moist and mucous membranes are normal. No oropharyngeal exudate.  Eyes: EOM are normal. Pupils are equal, round, and reactive to light.  Neck: Normal range of motion. Neck supple.  Cardiovascular: Normal rate, regular rhythm, normal heart sounds and intact distal pulses.   No murmur heard. Pulmonary/Chest: Effort normal. No respiratory distress. He has wheezes. He has no rales. He exhibits no tenderness.       Inspiratory and expiratory wheezes bilaterally.  No rales or rhonchi  Abdominal: Soft. He exhibits no distension and no mass. There is no tenderness. There is no rebound and no guarding.  Musculoskeletal: He exhibits no edema.  Lymphadenopathy:    He has no cervical adenopathy.  Neurological: He is alert and oriented to person, place, and time. He exhibits normal muscle tone. Coordination normal.  Skin: Skin is warm and dry.       Fluctuant 2 cm papule to the left axilla. Moderate drainage present.   Mild surrounding erythema also present.      ED Course  Procedures (including critical care time)  Labs Reviewed - No data to display Dg Chest 2 View  03/24/2012  *RADIOLOGY REPORT*  Clinical Data: 41 year old male shortness of breath cough chest pressure.  CHEST - 2 VIEW  Comparison: 03/20/2012 and earlier.  Findings: Lung volumes remain stable and normal.  Stable cardiac size at the upper limits of normal.  Other mediastinal contours are within normal limits.  Visualized tracheal air column is within normal limits.  The lungs are stable and clear. No acute osseous abnormality identified.  No pneumothorax.  IMPRESSION: No acute cardiopulmonary abnormality.   Original Report Authenticated By: Harley Hallmark, M.D.         MDM    INCISION AND DRAINAGE Performed by: Maxwell Caul. Consent: Verbal consent obtained. Risks and benefits: risks, benefits and alternatives were discussed Type: abscess  Body area: left axilla Anesthesia: local infiltration  Local anesthetic: lidocaine 1% w/o epinephrine  Anesthetic total:3 ml  Complexity: complex Blunt dissection to  break up loculations  Drainage: purulent  Drainage amount: moderate  Packing material: patient refuses to have packing placed.   Patient tolerance: Patient tolerated the procedure well with no immediate complications.      Pt has inspiratory and expiratory wheezes bilaterally.  NAD.  No hypoxia, tachycardia or tachypnea.  Patient currently taking prednisone  Previous ED chart reviewed by me.  Pt became slightly hypoxic with ambulation, will repeat albuterol neb.  Patient feeling much better after second neb tx.  Pulse ox now 94%.  He agrees to continue his prednisone and use the inhaler or neb every 4-6 hrs as directed.  Has appt next week with his PMD.    The patient appears reasonably screened and/or stabilized for discharge and I doubt any other medical condition or other [facility] requiring further screening, evaluation, or treatment in the ED at this time prior to discharge.   Prescribed: Doxy Percocet #20   Davied Nocito L. Chennel Olivos, [place] 03/25/12 0122

## 2012-03-24 NOTE — ED Notes (Signed)
Have a boil under my left arm also having a cough and some trouble breathing per pt.

## 2012-03-25 MED ORDER — DOXYCYCLINE HYCLATE 100 MG PO CAPS: 100.0000 mg | ORAL_CAPSULE | Freq: Two times a day (BID) | ORAL | Status: DC

## 2012-03-25 MED ORDER — OXYCODONE-ACETAMINOPHEN 5-325 MG PO TABS: 1.0000 | ORAL_TABLET | ORAL | Status: DC | PRN

## 2012-03-25 MED ORDER — ALBUTEROL SULFATE (5 MG/ML) 0.5% IN NEBU
5.0000 mg | INHALATION_SOLUTION | Freq: Once | RESPIRATORY_TRACT | Status: AC
  Administered 2012-03-25: 5 mg via RESPIRATORY_TRACT
  Filled 2012-03-25: qty 1

## 2012-03-25 NOTE — ED Provider Notes (Signed)
Medical screening examination/treatment/procedure(s) were performed by non-physician practitioner and as supervising physician I was immediately available for consultation/collaboration.  Linzee Depaul, MD 03/25/12 0202 

## 2012-04-03 ENCOUNTER — Encounter (HOSPITAL_COMMUNITY): Payer: Self-pay | Admitting: *Deleted

## 2012-04-03 ENCOUNTER — Emergency Department (HOSPITAL_COMMUNITY)
Admission: EM | Admit: 2012-04-03 | Discharge: 2012-04-03 | Disposition: A | Discharge status: Home / Self Care | Payer: Medicaid Other | Attending: Emergency Medicine | Admitting: Emergency Medicine

## 2012-04-03 DIAGNOSIS — M25559 Pain in unspecified hip: Secondary | ICD-10-CM

## 2012-04-03 DIAGNOSIS — Z794 Long term (current) use of insulin: Secondary | ICD-10-CM | POA: Insufficient documentation

## 2012-04-03 DIAGNOSIS — J45909 Unspecified asthma, uncomplicated: Secondary | ICD-10-CM | POA: Insufficient documentation

## 2012-04-03 DIAGNOSIS — I10 Essential (primary) hypertension: Secondary | ICD-10-CM | POA: Insufficient documentation

## 2012-04-03 DIAGNOSIS — G4730 Sleep apnea, unspecified: Secondary | ICD-10-CM | POA: Insufficient documentation

## 2012-04-03 DIAGNOSIS — G8929 Other chronic pain: Secondary | ICD-10-CM | POA: Insufficient documentation

## 2012-04-03 DIAGNOSIS — F17200 Nicotine dependence, unspecified, uncomplicated: Secondary | ICD-10-CM | POA: Insufficient documentation

## 2012-04-03 DIAGNOSIS — Z79899 Other long term (current) drug therapy: Secondary | ICD-10-CM | POA: Insufficient documentation

## 2012-04-03 DIAGNOSIS — E669 Obesity, unspecified: Secondary | ICD-10-CM | POA: Insufficient documentation

## 2012-04-03 DIAGNOSIS — E119 Type 2 diabetes mellitus without complications: Secondary | ICD-10-CM | POA: Insufficient documentation

## 2012-04-03 MED ORDER — KETOROLAC TROMETHAMINE 60 MG/2ML IM SOLN
60.0000 mg | Freq: Once | INTRAMUSCULAR | Status: AC
  Administered 2012-04-03: 60 mg via INTRAMUSCULAR
  Filled 2012-04-03: qty 2

## 2012-04-03 MED ORDER — OXYCODONE-ACETAMINOPHEN 5-325 MG PO TABS
2.0000 | ORAL_TABLET | Freq: Once | ORAL | Status: AC
  Administered 2012-04-03: 2 via ORAL
  Filled 2012-04-03: qty 2

## 2012-04-03 MED ORDER — OXYCODONE-ACETAMINOPHEN 10-325 MG PO TABS: 1.0000 | ORAL_TABLET | ORAL | Status: DC | PRN

## 2012-04-03 NOTE — ED Notes (Signed)
States that he gets steroid shots for the pain in his right hip.  States it has been 6 months since the last occurrence.  States he is currently on prednisone for his breathing, states he was seen about 2 weeks ago.   States he has an infection around the nail on his left hand as well.

## 2012-04-03 NOTE — ED Provider Notes (Signed)
Medical screening examination/treatment/procedure(s) were performed by non-physician practitioner and as supervising physician I was immediately available for consultation/collaboration. Devoria Albe, MD, Armando Gang   Ward Givens, MD 04/03/12 2056

## 2012-04-03 NOTE — ED Provider Notes (Signed)
History     CSN: [id number]  Arrival date & time 04/03/12  1478   First MD Initiated Contact with Patient 04/03/12 1902      Chief Complaint  Patient presents with  . Hip Pain    HPI Shawn Newman is a 41 y.o. male who presents to the ED with hip pain. The pain is located in the right hip. This is a recurrent problem. He rates his pain as 10/10. He states he had a cortisone injection in his hip by his regular doctor a few months ago that stopped the pain completely. He is here for pain management until he can see his doctor tomorrow. He has been on cortisone PO for bronchitis and took his last pill yesterday. He is a diabetic and his blood sugars have been elevated while taking the cortisone. The history was provided by the patient.  Past Medical History  Diagnosis Date  . Hypertension   . Diabetes mellitus   . Sleep apnea   . Abscess   . Chronic back pain   . Sciatica   . Asthma   . Bronchitis     History reviewed. No pertinent past surgical history.  Family History  Problem Relation Age of Onset  . Hypertension Mother   . Diabetes Mother   . Hypertension Father   . Diabetes Father   . Diabetes Brother   . Hypertension Brother     History  Substance Use Topics  . Smoking status: Current Every Day Smoker -- 0.5 packs/day    Types: Cigarettes  . Smokeless tobacco: Not on file  . Alcohol Use: No      Review of Systems  Constitutional: Negative for fever and chills.  HENT: Negative.   Eyes: Negative for pain, discharge and itching.  Respiratory: Positive for cough.   Cardiovascular: Negative for chest pain.  Gastrointestinal: Negative for abdominal distention.  Musculoskeletal: Positive for back pain (right hip pain).  Neurological: Negative for dizziness and headaches.  Psychiatric/Behavioral: Negative for confusion and agitation.    Allergies  Sulfa antibiotics  Home Medications   Current Outpatient Rx  Name Route Sig Dispense Refill  .  ACETAMINOPHEN 500 MG PO TABS Oral Take 1,000 mg by mouth every 6 (six) hours as needed. pain    . AMLODIPINE BESYLATE 10 MG PO TABS Oral Take 10 mg by mouth daily.      . CHOLINE FENOFIBRATE 45 MG PO CPDR Oral Take 45 mg by mouth daily.    Marland Kitchen DOXYCYCLINE HYCLATE 100 MG PO CAPS Oral Take 1 capsule (100 mg total) by mouth 2 (two) times daily. For 10 days 20 capsule 0  . FLUTICASONE-SALMETEROL 250-50 MCG/DOSE IN AEPB Inhalation Inhale 1 puff into the lungs every 12 (twelve) hours.    Marland Kitchen GLIPIZIDE 10 MG PO TABS Oral Take 10 mg by mouth 2 (two) times daily before a meal.      . HYDROCHLOROTHIAZIDE 25 MG PO TABS Oral Take 25 mg by mouth daily.     . INSULIN GLARGINE 100 UNIT/ML [place] SOLN Subcutaneous Inject 40 Units into the skin at bedtime.    Marland Kitchen LIRAGLUTIDE 18 MG/3ML [place] SOLN Subcutaneous Inject into the skin every morning. 60 units given every morning    . METFORMIN HCL 1000 MG PO TABS Oral Take 1,000 mg by mouth 2 (two) times daily with a meal.      . OXYCODONE-ACETAMINOPHEN 5-325 MG PO TABS Oral Take 1 tablet by mouth every 4 (four) hours as needed for pain.  20 tablet 0  . PRAVASTATIN SODIUM 20 MG PO TABS Oral Take 20 mg by mouth every evening.     Marland Kitchen PREDNISONE 50 MG PO TABS Oral Take 1 tablet (50 mg total) by mouth daily. 5 tablet 0  . SITAGLIPTIN PHOSPHATE 25 MG PO TABS Oral Take 25 mg by mouth daily.        BP 151/93  Pulse 84  Temp 98.6 F (37 C) (Oral)  Resp 20  Ht 6\' 1"  (1.854 m)  Wt 350 lb (158.759 kg)  BMI 46.18 kg/m2  SpO2 98%  Physical Exam  Nursing note and vitals reviewed. Constitutional: He is oriented to person, place, and time.       Extremely obese A/A male   HENT:  Head: Normocephalic and atraumatic.  Eyes: EOM are normal. Pupils are equal, round, and reactive to light.  Neck: Neck supple.  Cardiovascular: Normal rate.   Pulmonary/Chest: Effort normal. No respiratory distress.  Musculoskeletal: He exhibits no edema.       Pain with palpation right hip. No tenderness  over spinal cord. Pedal pulses present. Patient ambulatory without difficulty. Pain increases with bending forward and side to side.   Neurological: He is alert and oriented to person, place, and time. No cranial nerve deficit.  Skin: Skin is warm and dry.  Psychiatric: He has a normal mood and affect. His behavior is normal. Thought content normal.   Assessment: 41 y.o. male with right hip pain  Plan:  Percocet PO now and Toradol 60 mg IM   Follow up with PCP tomorrow. Return here as needed. Discussed with the patient and all questioned fully answered.   Medication List     As of 04/03/2012  8:53 PM    START taking these medications         * oxyCODONE-acetaminophen 10-325 MG per tablet   Commonly known as: PERCOCET   Take 1 tablet by mouth every 4 (four) hours as needed for pain.   Replaces: oxyCODONE-acetaminophen 5-325 MG per tablet     * Notice: This list has 1 medication(s) that are the same as other medications prescribed for you. Read the directions carefully, and ask your doctor or other care provider to review them with you.    ASK your doctor about these medications         albuterol-ipratropium 18-103 MCG/ACT inhaler   Commonly known as: COMBIVENT      amLODipine 10 MG tablet   Commonly known as: NORVASC      doxycycline 100 MG capsule   Commonly known as: VIBRAMYCIN   Take 1 capsule (100 mg total) by mouth 2 (two) times daily. For 10 days      Fluticasone-Salmeterol 250-50 MCG/DOSE Aepb   Commonly known as: ADVAIR      glipiZIDE 10 MG tablet   Commonly known as: GLUCOTROL      hydrochlorothiazide 25 MG tablet   Commonly known as: HYDRODIURIL      insulin NPH-insulin regular (70-30) 100 UNIT/ML injection   Commonly known as: NOVOLIN 70/30      LANTUS SOLOSTAR 100 UNIT/ML injection   Generic drug: insulin glargine      metFORMIN 1000 MG tablet   Commonly known as: GLUCOPHAGE      * oxyCODONE-acetaminophen 5-325 MG per tablet   Commonly known as:  PERCOCET/ROXICET   Take 1 tablet by mouth every 4 (four) hours as needed for pain.   Replaced by: oxyCODONE-acetaminophen 10-325 MG per tablet  pravastatin 20 MG tablet   Commonly known as: PRAVACHOL      predniSONE 50 MG tablet   Commonly known as: DELTASONE   Take 1 tablet (50 mg total) by mouth daily.      sitaGLIPtin 25 MG tablet   Commonly known as: JANUVIA      TRILIPIX 45 MG capsule   Generic drug: Choline Fenofibrate     * Notice: This list has 1 medication(s) that are the same as other medications prescribed for you. Read the directions carefully, and ask your doctor or other care provider to review them with you.        Where to get your medications    These are the prescriptions that you need to pick up.   You may get these medications from any pharmacy.         oxyCODONE-acetaminophen 10-325 MG per tablet           ED Course  Procedures        [facility], NP 04/03/12 2053

## 2012-04-03 NOTE — ED Notes (Signed)
Pt c/o right hip pain, pt states that he has had problems with his right hip before and pain became worse a few days ago, pt also has "pus" coming from left middle finger that started a few days ago

## 2012-04-11 ENCOUNTER — Encounter: Payer: Self-pay | Admitting: Internal Medicine

## 2012-04-11 ENCOUNTER — Ambulatory Visit (INDEPENDENT_AMBULATORY_CARE_PROVIDER_SITE_OTHER): Payer: Medicaid Other | Admitting: Internal Medicine

## 2012-04-11 VITALS — BP 122/72 | HR 99 | Temp 97.5°F | Ht 73.0 in | Wt 371.0 lb

## 2012-04-11 DIAGNOSIS — J45909 Unspecified asthma, uncomplicated: Secondary | ICD-10-CM

## 2012-04-11 DIAGNOSIS — R0600 Dyspnea, unspecified: Secondary | ICD-10-CM | POA: Insufficient documentation

## 2012-04-11 DIAGNOSIS — R05 Cough: Secondary | ICD-10-CM

## 2012-04-11 DIAGNOSIS — R059 Cough, unspecified: Secondary | ICD-10-CM

## 2012-04-11 MED ORDER — DEXLANSOPRAZOLE 60 MG PO CPDR: 60.0000 mg | DELAYED_RELEASE_CAPSULE | Freq: Every day | ORAL | Status: DC

## 2012-04-11 MED ORDER — BUDESONIDE-FORMOTEROL FUMARATE 160-4.5 MCG/ACT IN AERO: INHALATION_SPRAY | RESPIRATORY_TRACT | Status: DC

## 2012-04-11 MED ORDER — OXYCODONE-ACETAMINOPHEN 10-325 MG PO TABS: 1.0000 | ORAL_TABLET | ORAL | Status: DC | PRN

## 2012-04-11 NOTE — Assessment & Plan Note (Signed)
The most common causes of chronic cough in immunocompetent adults include the following: upper airway cough syndrome (UACS), previously referred to as postnasal drip syndrome (PNDS), which is caused by variety of rhinosinus conditions; (2) asthma; (3) GERD; (4) chronic bronchitis from cigarette smoking or other inhaled environmental irritants; (5) nonasthmatic eosinophilic bronchitis; and (6) bronchiectasis.   These conditions, singly or in combination, have accounted for up to 94% of the causes of chronic cough in prospective studies.   Other conditions have constituted no >6% of the causes in prospective studies These have included bronchogenic carcinoma, chronic interstitial pneumonia, sarcoidosis, left ventricular failure, ACEI-induced cough, and aspiration from a condition associated with pharyngeal dysfunction.   Most likely this is  Classic Upper airway cough syndrome, so named because it's frequently impossible to sort out how much is  CR/sinusitis with freq throat clearing (which can be related to primary GERD)   vs  causing  secondary (" extra esophageal")  GERD from wide swings in gastric pressure that occur with throat clearing, often  promoting self use of mint and menthol lozenges that reduce the lower esophageal sphincter tone and exacerbate the problem further in a cyclical fashion.   These are the same pts (now being labeled as having "irritable larynx syndrome" by some cough centers) who not infrequently have a history of having failed to tolerate ace inhibitors,  dry powder inhalers like advair or biphosphonates or report having atypical reflux symptoms that don't respond to standard doses of PPI , and are easily confused as having aecopd or asthma flares by even experienced allergists/ pulmonologists.  For now max rx for gerd and change to symbicort 160 2bid for ? Asthma   Pending f/u pft's

## 2012-04-11 NOTE — Progress Notes (Signed)
  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   04/11/2012 1st pulmonary eval cc indolent onset progressively worse nonprod cough and sob on advair x 2 years worse since quit smoking assoc with further wt gain - advair was started when other people thought he was wheezing but he was not aware.  No better p prednisone, some better with combivent - uses up to 4-5 x per day and does better at hs. No worse when forgets to take advair.    No obvious pattern to daytime variabilty in terms of sob (sometimes has it at rest but not typically sleeping) or assoc chronic cough or cp or chest tightness, subjective wheeze overt sinus or hb symptoms. No unusual exp hx or h/o childhood pna/ asthma or premature birth to his knowledge.   Sleeping ok without nocturnal  or early am exacerbation  of respiratory  c/o's or need for noct saba. Also denies any obvious fluctuation of symptoms with weather or environmental changes or other aggravating or alleviating factors except as outlined above    Review of Systems  Constitutional: Negative for fever, chills, activity change, appetite change and unexpected weight change.  HENT: Positive for congestion and sneezing. Negative for sore throat, rhinorrhea, trouble swallowing, dental problem, voice change and postnasal drip.   Eyes: Negative for visual disturbance.  Respiratory: Positive for cough and shortness of breath. Negative for choking.   Cardiovascular: Negative for chest pain and leg swelling.  Gastrointestinal: Negative for nausea, vomiting and abdominal pain.  Genitourinary: Negative for difficulty urinating.  Musculoskeletal: Negative for arthralgias.  Skin: Negative for rash.    Psychiatric/Behavioral: Negative for behavioral problems and confusion.       Objective:   Physical Exam amb mod hoarse bm nad with typical pseudowheeze Wt Readings from Last 3 Encounters:  04/11/12 371 lb (168.284 kg)  04/03/12 350 lb (158.759 kg)  03/24/12 360 lb (163.295 kg)    HEENT: nl dentition, turbinates, and orophanx. Nl external ear canals without cough reflex   NECK :  without JVD/Nodes/TM/ nl carotid upstrokes bilaterally   LUNGS: no acc muscle use, clear to A and P bilaterally without cough on insp or exp maneuvers   CV:  RRR  no s3 or murmur or increase in P2, no edema   ABD:  massivley obese but soft and nontender with nl excursion in the supine position. No bruits or organomegaly, bowel sounds nl  MS:  warm without deformities, calf tenderness, cyanosis or clubbing  SKIN: warm and dry without lesions    NEURO:  alert, approp, no deficits   03/24/12 cxr No acute cardiopulmonary abnormality       Assessment & Plan:

## 2012-04-11 NOTE — Assessment & Plan Note (Signed)
In addition to obvious issues with increased weight bearing with activity he may also now have gerd related to obesity  Critical here not to rx with systemic steroids if can avoid them

## 2012-04-11 NOTE — Patient Instructions (Addendum)
Symbicort 160 Take 2 puffs first thing in am and then another 2 puffs about 12 hours later.   Dexilant 60 mg Take 30-60 min before first meal of the day   If needed for breathing or coughing take combivent one every 4 hours   For cough mucinex dm is the best choice  But if still coughing ok to take oxycodone every 4 hours as needed  GERD (REFLUX)  is an extremely common cause of respiratory symptoms, many times with no significant heartburn at all.    It can be treated with medication, but also with lifestyle changes including avoidance of late meals, excessive alcohol, smoking cessation, and avoid fatty foods, chocolate, peppermint, colas, red wine, and acidic juices such as orange juice.  NO MINT OR MENTHOL PRODUCTS SO NO COUGH DROPS  USE SUGARLESS CANDY INSTEAD (jolley ranchers or Stover's)  NO OIL BASED VITAMINS - use powdered substitutes.    See Tammy NP w/in 2 weeks with all your medications, even over the counter meds, separated in two separate bags, the ones you take no matter what vs the ones you stop once you feel better and take only as needed when you feel you need them.   Tammy  will generate for you a new user friendly medication calendar that will put [place] all on the same page re: your medication use.     Without this process, it simply isn't possible to assure that we are providing  your outpatient care  with  the attention to detail we feel you deserve.   If we cannot assure that you're getting that kind of care,  then we cannot manage your problem effectively from this clinic.  Once you have seen Tammy and we are sure that we're all on the same page with your medication use she will arrange follow up with me.

## 2012-04-11 NOTE — Assessment & Plan Note (Signed)
DDX of  difficult airways managment all start with A and  include Adherence, Ace Inhibitors, Acid Reflux, Active Sinus Disease, Alpha 1 Antitripsin deficiency, Anxiety masquerading as Airways dz,  ABPA,  allergy(esp in young), Aspiration (esp in elderly), Adverse effects of DPI,  Active smokers, plus two Bs  = Bronchiectasis and Beta blocker use..and one C= CHF   Adherence is always the initial "prime suspect" and is a multilayered concern that requires a "trust but verify" approach in every patient - starting with knowing how to use medications, especially inhalers, correctly, keeping up with refills and understanding the fundamental difference between maintenance and prns vs those medications only taken for a very short course and then stopped and not refilled. He is confused with concept of maint vs prns , reviewed.   To keep things simple, I have asked the patient to first separate medicines that are perceived as maintenance, that is to be taken daily "no matter what", from those medicines that are taken on only on an as-needed basis and I have given the patient examples of both, and then return to see our NP to generate a  detailed  medication calendar which should be followed until the next physician sees the patient and updates it.   The proper method of use, as well as anticipated side effects, of a metered-dose inhaler are discussed and demonstrated to the patient. Improved effectiveness after extensive coaching during this visit to a level of approximately  75%  ? Acid reflux > rx with dexilant samples and diet/ reviewed

## 2012-04-16 ENCOUNTER — Institutional Professional Consult (permissible substitution): Payer: Medicaid Other | Admitting: Internal Medicine

## 2012-04-25 ENCOUNTER — Ambulatory Visit (INDEPENDENT_AMBULATORY_CARE_PROVIDER_SITE_OTHER)
Admission: RE | Admit: 2012-04-25 | Discharge: 2012-04-25 | Disposition: A | Discharge status: Home / Self Care | Payer: Medicaid Other | Source: Ambulatory Visit | Attending: Adult Health | Admitting: Adult Health

## 2012-04-25 ENCOUNTER — Other Ambulatory Visit: Payer: Self-pay | Admitting: Adult Health

## 2012-04-25 ENCOUNTER — Ambulatory Visit (INDEPENDENT_AMBULATORY_CARE_PROVIDER_SITE_OTHER): Payer: Medicaid Other | Admitting: Adult Health

## 2012-04-25 ENCOUNTER — Encounter: Payer: Self-pay | Admitting: Adult Health

## 2012-04-25 VITALS — BP 124/96 | HR 90 | Temp 96.7°F | Ht 73.0 in | Wt 375.4 lb

## 2012-04-25 DIAGNOSIS — R0609 Other forms of dyspnea: Secondary | ICD-10-CM

## 2012-04-25 DIAGNOSIS — R079 Chest pain, unspecified: Secondary | ICD-10-CM

## 2012-04-25 DIAGNOSIS — R0781 Pleurodynia: Secondary | ICD-10-CM

## 2012-04-25 DIAGNOSIS — R059 Cough, unspecified: Secondary | ICD-10-CM

## 2012-04-25 DIAGNOSIS — G4733 Obstructive sleep apnea (adult) (pediatric): Secondary | ICD-10-CM

## 2012-04-25 DIAGNOSIS — R0600 Dyspnea, unspecified: Secondary | ICD-10-CM

## 2012-04-25 DIAGNOSIS — R05 Cough: Secondary | ICD-10-CM

## 2012-04-25 MED ORDER — OXYCODONE-ACETAMINOPHEN 10-325 MG PO TABS: 1.0000 | ORAL_TABLET | ORAL | Status: DC | PRN

## 2012-04-25 MED ORDER — OMEPRAZOLE 20 MG PO CPDR: 20.0000 mg | DELAYED_RELEASE_CAPSULE | Freq: Every day | ORAL | Status: DC

## 2012-04-25 NOTE — Patient Instructions (Addendum)
We are setting you up for an overnight oximetry on CPAP  Follow up in 4 weeks with Dr. Sherene Sires with a pulmonary function test (PFT)   Delsym 2 teaspoons twice daily as needed. For cough May use Percocet 1 every 4 hours as needed. For cough control Use. Chlor tabs 4 mg 1 every 4 hours as needed. For postnasal drainage, throat clearing, tickle in throat Follow med calendar closely and bring to each visit I will call with labs

## 2012-04-25 NOTE — Assessment & Plan Note (Signed)
Suspect is mulitfactoral  reCheck xray today  Check bnp/esr  Set up ONO on CPAP for desats.  Add delsym and chlor tabs for cough control  May have #30 of percocet only , no refills to be given. Discussed with Dr. Sherene Sires   Patient's medications were reviewed today and patient education was given. Computerized medication calendar was adjusted/completed

## 2012-04-25 NOTE — Addendum Note (Signed)
Addended by: Caryl Ada on: 04/25/2012 10:29 AM   Modules accepted: Orders

## 2012-04-25 NOTE — Progress Notes (Signed)
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   04/11/2012 1st pulmonary eval cc indolent onset progressively worse nonprod cough and sob on advair x 2 years worse since quit smoking assoc with further wt gain - advair was started when other people thought he was wheezing but he was not aware.  No better p prednisone, some better with combivent - uses up to 4-5 x per day and does better at hs. No worse when forgets to take advair.    >>symbicort rx ,   /04/25/2012 Follow up and med review  Patient returns for a two-week followup for shortness of breath and cough Seen last visit for her initial pulmonary consult her for a six-month history of progressively worsening dry cough and shortness of breath. He has had multiple emergency room visits with last chest x-ray E. 03/24/2012 with no acute process noted. D-dimer and CBC and cardiac enzymes were negative in September He does have sleep apnea, and wears CPAP at night He quit smoking approximately 4 months ago  Last visit was placed on a trial of Symbicort, given a cough suppression regimen with Percocet And started on Dexilant  for reflux suppression He says he did have some improvement in cough, however, did not resolve and cough is worse at night  and has severe coughing paroxysms to the point where he has rib pain He denies any fever or hemoptysis, orthopnea, or discolored mucus, or increased leg swelling He does request additional Percocet. Walk today in office with minimal desats to 89% , w/ quick rebound to 90-93%.      Review of Systems  Constitutional:   No  weight loss, night sweats,  Fevers, chills,  +fatigue, or  lassitude.  HEENT:   No headaches,   Difficulty swallowing,  Tooth/dental problems, or  Sore throat,                No sneezing, itching, ear ache, nasal congestion, post nasal drip,   CV:  No chest pain,  Orthopnea, PND, swelling in lower extremities, anasarca, dizziness, palpitations, syncope.   GI  No heartburn, indigestion, abdominal pain, nausea, vomiting, diarrhea, change in bowel habits, loss of appetite, bloody stools.   Resp:  No coughing up of blood.  No change in color of mucus.  No wheezing.  No chest wall deformity  Skin: no rash or lesions.  GU: no dysuria, change in color of urine, no urgency or frequency.  No flank pain, no hematuria   MS:  No joint pain or swelling.  No decreased range of motion.  No back pain.  Psych:  No change in mood or affect. No depression or anxiety.  No memory loss.          Objective:   Physical Exam amb mod hoarse bm    HEENT: nl dentition, turbinates, and orophanx. Nl external ear canals without cough reflex   NECK :  without JVD/Nodes/TM/ nl carotid upstrokes bilaterally   LUNGS: no acc muscle use,  CTA , upper airway psuedowheezing   CV:  RRR  no s3 or murmur or increase in P2, no edema   ABD:  massivley obese but soft and nontender with nl excursion in  the supine position. No bruits or organomegaly, bowel sounds nl  MS:  warm without deformities, calf tenderness, cyanosis or clubbing  SKIN: warm and dry without lesions    NEURO:  alert, approp, no deficits   03/24/12 cxr No acute cardiopulmonary abnormality       Assessment & Plan:

## 2012-04-26 ENCOUNTER — Telehealth: Payer: Self-pay | Admitting: Internal Medicine

## 2012-04-26 NOTE — Telephone Encounter (Signed)
Notes Recorded by Julio Sicks, NP on 04/25/2012 at 2:16 PM cxr is clear  Cont w/ ov recs  Please contact office for sooner follow up if symptoms do not improve or worsen or seek emergency care   I spoke with patient about results and he verbalized understanding and had no questions

## 2012-05-09 ENCOUNTER — Emergency Department (HOSPITAL_COMMUNITY)
Admission: EM | Admit: 2012-05-09 | Discharge: 2012-05-09 | Disposition: A | Discharge status: Home / Self Care | Payer: Medicaid Other | Attending: Emergency Medicine | Admitting: Emergency Medicine

## 2012-05-09 ENCOUNTER — Encounter (HOSPITAL_COMMUNITY): Payer: Self-pay | Admitting: Emergency Medicine

## 2012-05-09 DIAGNOSIS — Z79899 Other long term (current) drug therapy: Secondary | ICD-10-CM | POA: Insufficient documentation

## 2012-05-09 DIAGNOSIS — N481 Balanitis: Secondary | ICD-10-CM

## 2012-05-09 DIAGNOSIS — I10 Essential (primary) hypertension: Secondary | ICD-10-CM | POA: Insufficient documentation

## 2012-05-09 DIAGNOSIS — G8929 Other chronic pain: Secondary | ICD-10-CM | POA: Insufficient documentation

## 2012-05-09 DIAGNOSIS — M5430 Sciatica, unspecified side: Secondary | ICD-10-CM | POA: Insufficient documentation

## 2012-05-09 DIAGNOSIS — Z87891 Personal history of nicotine dependence: Secondary | ICD-10-CM | POA: Insufficient documentation

## 2012-05-09 DIAGNOSIS — N476 Balanoposthitis: Secondary | ICD-10-CM | POA: Insufficient documentation

## 2012-05-09 DIAGNOSIS — Z794 Long term (current) use of insulin: Secondary | ICD-10-CM | POA: Insufficient documentation

## 2012-05-09 DIAGNOSIS — G4733 Obstructive sleep apnea (adult) (pediatric): Secondary | ICD-10-CM | POA: Insufficient documentation

## 2012-05-09 DIAGNOSIS — M549 Dorsalgia, unspecified: Secondary | ICD-10-CM | POA: Insufficient documentation

## 2012-05-09 DIAGNOSIS — J45909 Unspecified asthma, uncomplicated: Secondary | ICD-10-CM | POA: Insufficient documentation

## 2012-05-09 DIAGNOSIS — E119 Type 2 diabetes mellitus without complications: Secondary | ICD-10-CM | POA: Insufficient documentation

## 2012-05-09 MED ORDER — CLOTRIMAZOLE 1 % EX CREA: TOPICAL_CREAM | CUTANEOUS | Status: DC

## 2012-05-09 NOTE — ED Provider Notes (Signed)
History   This chart was scribed for Joya Gaskins, MD by Gerlean Ren, ED Scribe. This patient was seen in[facility] and the patient's care was started at 8:20 AM    CSN: [id number]  Arrival date & time 05/09/12  7829   First MD Initiated Contact with Patient 05/09/12 0818      Chief Complaint  Patient presents with  . Testicle Pain     The history is provided by the patient. No language interpreter was used.   Shawn Newman is a 41 y.o. male with h/o DM and HTN who presents to the Emergency Department complaining of 4 days of testicular pain with associated testicular itching and swelling.  Pt denies difficulty urinating, dysuria, back pain, abdominal pain, fever and emesis as associated symptoms.     Past Medical History  Diagnosis Date  . Hypertension   . Diabetes mellitus   . Abscess   . Chronic back pain   . Sciatica   . Asthma   . Bronchitis   . OSA (obstructive sleep apnea)     History reviewed. No pertinent past surgical history.  Family History  Problem Relation Age of Onset  . Hypertension Mother   . Diabetes Mother   . Hypertension Father   . Diabetes Father   . Diabetes Brother   . Hypertension Brother   . Colon cancer Sister     History  Substance Use Topics  . Smoking status: Former Smoker -- 0.5 packs/day for 24 years    Types: Cigarettes    Start date: 06/20/1987    Quit date: 12/18/2011  . Smokeless tobacco: Never Used  . Alcohol Use: No      Review of Systems  Constitutional: Negative for fever.  Gastrointestinal: Negative for vomiting and abdominal pain.  Genitourinary: Positive for testicular pain. Negative for dysuria, decreased urine volume and difficulty urinating.  Musculoskeletal: Negative for back pain.    Allergies  Sulfa antibiotics  Home Medications   Current Outpatient Rx  Name  Route  Sig  Dispense  Refill  . IPRATROPIUM-ALBUTEROL 18-103 MCG/ACT IN AERO   Inhalation   Inhale 2 puffs into the lungs  every 6 (six) hours as needed. For shortness of breath         . AMLODIPINE BESYLATE 10 MG PO TABS   Oral   Take 10 mg by mouth daily.           . BUDESONIDE-FORMOTEROL FUMARATE 160-4.5 MCG/ACT IN AERO      Take 2 puffs first thing in am and then another 2 puffs about 12 hours later.   1 Inhaler   12   . CHOLINE FENOFIBRATE 135 MG PO CPDR   Oral   Take 135 mg by mouth daily.         . DEXLANSOPRAZOLE 60 MG PO CPDR   Oral   Take 1 capsule (60 mg total) by mouth daily.   15 capsule      . EXENATIDE 5 MCG/0.02ML [place] SOLN   Subcutaneous   Inject 5 mcg into the skin every morning.         Marland Kitchen GLIPIZIDE 10 MG PO TABS   Oral   Take 10 mg by mouth 2 (two) times daily before a meal.           . HYDROCHLOROTHIAZIDE 25 MG PO TABS   Oral   Take 25 mg by mouth daily.          . INSULIN GLARGINE 100  UNIT/ML [place] SOLN   Subcutaneous   Inject 60 Units into the skin at bedtime.          . INSULIN ISOPHANE & REGULAR (70-30) 100 UNIT/ML [place] SUSP   Subcutaneous   Inject into the skin. Sliding scale         . METFORMIN HCL 1000 MG PO TABS   Oral   Take 1,000 mg by mouth 2 (two) times daily with a meal.           . OMEPRAZOLE 20 MG PO CPDR   Oral   Take 1 capsule (20 mg total) by mouth daily.   30 capsule   11   . OXYCODONE-ACETAMINOPHEN 10-325 MG PO TABS   Oral   Take 1 tablet by mouth every 4 (four) hours as needed for pain.   30 tablet   0   . PRAVASTATIN SODIUM 20 MG PO TABS   Oral   Take 20 mg by mouth daily.         Marland Kitchen SITAGLIPTIN PHOSPHATE 25 MG PO TABS   Oral   Take 25 mg by mouth daily.             BP 123/80  Pulse 102  Temp 98.8 F (37.1 C) (Oral)  Resp 18  Ht 6\' 1"  (1.854 m)  Wt 363 lb (164.656 kg)  BMI 47.89 kg/m2  SpO2 92%  Physical Exam CONSTITUTIONAL: Well developed/well nourished HEAD AND FACE: Normocephalic/atraumatic EYES: EOMI ENMT: Mucous membranes moist NECK: supple no meningeal signs CV: S1/S2 noted, no  murmurs/rubs/gallops noted LUNGS: Lungs are clear to auscultation bilaterally, no apparent distress ABDOMEN: soft, nontender, no rebound or guarding, obese GU:no cva tenderness. No testicular tenderness/swelling/erythema/abscess noted.  No hernia noted.  He has mild erythema of glans penis.  No signs of paraphimosis or phimosis.  No penile lesions.  No signs of cellulitis NEURO: Pt is awake/alert, moves all extremitiesx4 SKIN: warm, color normal PSYCH: no abnormalities of mood noted  ED Course  Procedures  DIAGNOSTIC STUDIES:   COORDINATION OF CARE: 8:23 AM- Patient informed of clinical course, understands medical decision-making process, and agrees with plan.  Ordered lotrimin cream at discharge. Suspect balanitis.  No signs of abscess or acute testicular pathology   MDM  Nursing notes including past medical history and social history reviewed and considered in documentation    I personally performed the services described in this documentation, which was scribed in my presence. The recorded information has been reviewed and is accurate.           Joya Gaskins, MD 05/09/12 [phone]

## 2012-05-09 NOTE — ED Notes (Signed)
Pt c/o knot to testicle and swelling of the penis x 4 days.

## 2012-05-09 NOTE — ED Notes (Signed)
Notified Dr. Bebe Shaggy that patient requests something for pain.  He declined to give any meds.

## 2012-05-30 ENCOUNTER — Ambulatory Visit: Payer: Medicaid Other | Admitting: Internal Medicine

## 2012-06-11 ENCOUNTER — Ambulatory Visit (INDEPENDENT_AMBULATORY_CARE_PROVIDER_SITE_OTHER): Payer: Medicaid Other | Admitting: Internal Medicine

## 2012-06-11 ENCOUNTER — Encounter: Payer: Self-pay | Admitting: Internal Medicine

## 2012-06-11 VITALS — BP 124/80 | HR 107 | Temp 97.6°F | Ht 73.0 in | Wt 376.0 lb

## 2012-06-11 DIAGNOSIS — J45909 Unspecified asthma, uncomplicated: Secondary | ICD-10-CM

## 2012-06-11 DIAGNOSIS — R05 Cough: Secondary | ICD-10-CM

## 2012-06-11 DIAGNOSIS — G4733 Obstructive sleep apnea (adult) (pediatric): Secondary | ICD-10-CM

## 2012-06-11 MED ORDER — OXYCODONE-ACETAMINOPHEN 10-325 MG PO TABS: 1.0000 | ORAL_TABLET | ORAL | Status: DC | PRN

## 2012-06-11 NOTE — Assessment & Plan Note (Addendum)
Minimal desats with walking 04/25/2012 -drop to 89% w/ quick rebound 90-93%  D. Dimer 02/2012 neg   Main finding on pft's 06/11/2012 is ERV only 20 % of nl > discussed implications on breathing

## 2012-06-11 NOTE — Assessment & Plan Note (Signed)
All goals of chronic asthma control met including optimal function and elimination of symptoms with minimal need for rescue therapy.  Contingencies discussed in full including contacting this office immediately if not controlling the symptoms using the rule of two's.    

## 2012-06-11 NOTE — Assessment & Plan Note (Signed)
Unfortunately ono study was done off cpap per pt > repeat and refer back to [facility] if anything other than 02 needed

## 2012-06-11 NOTE — Progress Notes (Signed)
PFT done today. 

## 2012-06-11 NOTE — Progress Notes (Signed)
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   04/11/2012 1st pulmonary eval cc indolent onset progressively worse nonprod cough and sob on advair x 2 years worse since quit smoking assoc with further wt gain - advair was started when other people thought he was wheezing but he was not aware.  No better p prednisone, some better with combivent - uses up to 4-5 x per day and does better at hs. No worse when forgets to take advair.    >>symbicort rx ,   /04/25/2012 Follow up and med review  Patient returns for a two-week followup for shortness of breath and cough Seen last visit for her initial pulmonary consult her for a six-month history of progressively worsening dry cough and shortness of breath. He has had multiple emergency room visits with last chest x-ray E. 03/24/2012 with no acute process noted. D-dimer and CBC and cardiac enzymes were negative in September He does have sleep apnea, and wears CPAP at night He quit smoking approximately 4 months ago  Last visit was placed on a trial of Symbicort, given a cough suppression regimen with Percocet And started on Dexilant  for reflux suppression He says he did have some improvement in cough, however, did not resolve and cough is worse at night  and has severe coughing paroxysms to the point where he has rib pain He denies any fever or hemoptysis, orthopnea, or discolored mucus, or increased leg swelling He does request additional Percocet. Walk today in office with minimal desats to 89% , w/ quick rebound to 90-93%.  rec We are setting you up for an overnight oximetry on CPAP  Follow  up in 4 weeks with Dr. Sherene Sires with a pulmonary function test (PFT)   Delsym 2 teaspoons twice daily as needed. For cough May use Percocet 1 every 4 hours as needed. For cough control Use. Chlor tabs 4 mg 1 every 4 hours as needed. For postnasal drainage, throat clearing, tickle in throat Follow med calendar closely and bring to each visit   06/11/2012 f/u ov/Ahria Slappey no med calendar cc breathing better to his satisfaction onsymbicort 160 2 bid and no need combivent. No obvious daytime variabilty or assoc chronic cough or cp or chest tightness, subjective wheeze overt sinus or hb symptoms. No unusual exp hx or h/o childhood pna/ asthma or premature birth to his knowledge.   Sleeping ok on cpap without nocturnal  or early am exacerbation  of respiratory  c/o's or need for noct saba. Also denies any obvious fluctuation of symptoms with weather or environmental changes or other aggravating or alleviating factors except as outlined above  ROS  The following are not active complaints unless bolded sore throat, dysphagia, dental problems, itching, sneezing,  nasal congestion or excess/ purulent secretions, ear ache,   fever, chills, sweats, unintended wt loss, pleuritic or exertional cp, hemoptysis,  orthopnea pnd or leg swelling, presyncope, palpitations, heartburn, abdominal pain, anorexia, nausea, vomiting, diarrhea  or change in bowel or urinary habits, change in stools or urine, dysuria,hematuria,  rash, arthralgias, visual complaints, headache, numbness weakness or ataxia or problems with walking  or coordination,  change in mood/affect or memory.                    Objective:   Physical Exam  Wt Readings from Last 3 Encounters:  06/11/12 376 lb (170.552 kg)  05/09/12 363 lb (164.656 kg)  04/25/12 375 lb 6.4 oz (170.28 kg)      HEENT: nl dentition, turbinates, and orophanx. Nl external ear canals without cough reflex   NECK :  without JVD/Nodes/TM/ nl carotid upstrokes  bilaterally   LUNGS: no acc muscle use,  CTA , upper airway pseudowheezing resolved  CV:  RRR  no s3 or murmur or increase in P2, no edema   ABD:  massivley obese but soft and nontender with nl excursion in the supine position. No bruits or organomegaly, bowel sounds nl  MS:  warm without deformities, calf tenderness, cyanosis or clubbing  SKIN: warm and dry without lesions    NEURO:  alert, approp, no deficits   03/24/12 cxr No acute cardiopulmonary abnormality       Assessment & Plan:

## 2012-06-11 NOTE — Patient Instructions (Addendum)
Continue symbicort 160 Take 2 puffs first thing in am and then another 2 puffs about 12 hours later and combivent is just as needed  You do not have significant copd and you never will unless you resume smoking  We will have your sleep 0xygen study repeated on the cpap and supply you with oxygen if needed but you should get follow up through the [facility] to fine tune your cpap machine   If you are satisfied with your treatment plan let your doctor know and he/she can either refill your medications or you can return here when your prescription runs out.     If in any way you are not 100% satisfied,  please tell us.  If 100% better, tell your friends!

## 2012-06-19 ENCOUNTER — Emergency Department (HOSPITAL_COMMUNITY): Payer: Medicaid Other

## 2012-06-19 ENCOUNTER — Emergency Department (HOSPITAL_COMMUNITY)
Admission: EM | Admit: 2012-06-19 | Discharge: 2012-06-19 | Disposition: A | Discharge status: Home / Self Care | Payer: Medicaid Other | Attending: Emergency Medicine | Admitting: Emergency Medicine

## 2012-06-19 ENCOUNTER — Encounter (HOSPITAL_COMMUNITY): Payer: Self-pay | Admitting: *Deleted

## 2012-06-19 DIAGNOSIS — Z794 Long term (current) use of insulin: Secondary | ICD-10-CM | POA: Insufficient documentation

## 2012-06-19 DIAGNOSIS — K921 Melena: Secondary | ICD-10-CM | POA: Insufficient documentation

## 2012-06-19 DIAGNOSIS — E119 Type 2 diabetes mellitus without complications: Secondary | ICD-10-CM | POA: Insufficient documentation

## 2012-06-19 DIAGNOSIS — Z8669 Personal history of other diseases of the nervous system and sense organs: Secondary | ICD-10-CM | POA: Insufficient documentation

## 2012-06-19 DIAGNOSIS — Z87891 Personal history of nicotine dependence: Secondary | ICD-10-CM | POA: Insufficient documentation

## 2012-06-19 DIAGNOSIS — K625 Hemorrhage of anus and rectum: Secondary | ICD-10-CM | POA: Insufficient documentation

## 2012-06-19 DIAGNOSIS — D3500 Benign neoplasm of unspecified adrenal gland: Secondary | ICD-10-CM | POA: Insufficient documentation

## 2012-06-19 DIAGNOSIS — Z79899 Other long term (current) drug therapy: Secondary | ICD-10-CM | POA: Insufficient documentation

## 2012-06-19 DIAGNOSIS — G8929 Other chronic pain: Secondary | ICD-10-CM | POA: Insufficient documentation

## 2012-06-19 DIAGNOSIS — J45909 Unspecified asthma, uncomplicated: Secondary | ICD-10-CM | POA: Insufficient documentation

## 2012-06-19 DIAGNOSIS — I10 Essential (primary) hypertension: Secondary | ICD-10-CM | POA: Insufficient documentation

## 2012-06-19 DIAGNOSIS — Z8709 Personal history of other diseases of the respiratory system: Secondary | ICD-10-CM | POA: Insufficient documentation

## 2012-06-19 DIAGNOSIS — Z8739 Personal history of other diseases of the musculoskeletal system and connective tissue: Secondary | ICD-10-CM | POA: Insufficient documentation

## 2012-06-19 LAB — CBC WITH DIFFERENTIAL/PLATELET
Basophils Relative: 0 % (ref 0–1)
Eosinophils Relative: 2 % (ref 0–5)
Hemoglobin: 15.8 g/dL (ref 13.0–17.0)
Lymphs Abs: 3.3 10*3/uL (ref 0.7–4.0)
MCV: 82.1 fL (ref 78.0–100.0)
Monocytes Relative: 6 % (ref 3–12)
Platelets: 304 10*3/uL (ref 150–400)
RBC: 5.53 MIL/uL (ref 4.22–5.81)
WBC: 7.7 10*3/uL (ref 4.0–10.5)

## 2012-06-19 LAB — COMPREHENSIVE METABOLIC PANEL
ALT: 50 U/L (ref 0–53)
AST: 34 U/L (ref 0–37)
CO2: 23 mEq/L (ref 19–32)
Chloride: 95 mEq/L — ABNORMAL LOW (ref 96–112)
GFR calc non Af Amer: >90 mL/min (ref >90)
Sodium: 130 mEq/L — ABNORMAL LOW (ref 135–145)
Total Bilirubin: 0.5 mg/dL (ref 0.3–1.2)

## 2012-06-19 MED ORDER — SODIUM CHLORIDE 0.9 % IV BOLUS (SEPSIS)
500.0000 mL | Freq: Once | INTRAVENOUS | Status: AC
  Administered 2012-06-19: 500 mL via INTRAVENOUS

## 2012-06-19 MED ORDER — HYDROMORPHONE HCL PF 1 MG/ML IJ SOLN
1.0000 mg | Freq: Once | INTRAMUSCULAR | Status: AC
  Administered 2012-06-19: 1 mg via INTRAVENOUS
  Filled 2012-06-19: qty 1

## 2012-06-19 MED ORDER — ONDANSETRON HCL 4 MG/2ML IJ SOLN
4.0000 mg | Freq: Once | INTRAMUSCULAR | Status: AC
  Administered 2012-06-19: 4 mg via INTRAVENOUS
  Filled 2012-06-19: qty 2

## 2012-06-19 MED ORDER — PANTOPRAZOLE SODIUM 40 MG IV SOLR
40.0000 mg | Freq: Once | INTRAVENOUS | Status: AC
  Administered 2012-06-19: 40 mg via INTRAVENOUS
  Filled 2012-06-19: qty 40

## 2012-06-19 MED ORDER — HYDROCODONE-ACETAMINOPHEN 5-325 MG PO TABS: 1.0000 | ORAL_TABLET | Freq: Four times a day (QID) | ORAL | Status: DC | PRN

## 2012-06-19 MED ORDER — IOHEXOL 300 MG/ML  SOLN
100.0000 mL | Freq: Once | INTRAMUSCULAR | Status: AC | PRN
  Administered 2012-06-19: 100 mL via INTRAVENOUS

## 2012-06-19 NOTE — ED Notes (Signed)
MD at bedside. 

## 2012-06-19 NOTE — ED Notes (Signed)
Diarrhea for 1 week, now rectal bleeding, seen by his md yesterday.  And planning colonoscopy

## 2012-06-20 NOTE — ED Provider Notes (Signed)
History     CSN: [id number]  Arrival date & time 06/19/12  1713   First MD Initiated Contact with Patient 06/19/12 1731      Chief Complaint  Patient presents with  . Rectal Bleeding    (Consider location/radiation/quality/duration/timing/severity/associated sxs/prior treatment) Patient is a 42 y.o. male presenting with hematochezia. The history is provided by the patient (the pt complains of rectal bleeding). No language interpreter was used.  Rectal Bleeding  The current episode started 5 to 7 days ago. The problem occurs rarely. The problem has been unchanged. The pain is moderate. The stool is described as soft. There was no prior successful therapy. There was no prior unsuccessful therapy. Pertinent negatives include no abdominal pain, no diarrhea, no hematuria, no chest pain, no headaches, no coughing and no rash.    Past Medical History  Diagnosis Date  . Hypertension   . Diabetes mellitus   . Abscess   . Chronic back pain   . Sciatica   . Asthma   . Bronchitis   . OSA (obstructive sleep apnea)     History reviewed. No pertinent past surgical history.  Family History  Problem Relation Age of Onset  . Hypertension Mother   . Diabetes Mother   . Hypertension Father   . Diabetes Father   . Diabetes Brother   . Hypertension Brother   . Colon cancer Sister     History  Substance Use Topics  . Smoking status: Former Smoker -- 0.5 packs/day for 24 years    Types: Cigarettes    Start date: 06/20/1987    Quit date: 12/18/2011  . Smokeless tobacco: Never Used  . Alcohol Use: No      Review of Systems  Constitutional: Negative for fatigue.  HENT: Negative for congestion, sinus pressure and ear discharge.   Eyes: Negative for discharge.  Respiratory: Negative for cough.   Cardiovascular: Negative for chest pain.  Gastrointestinal: Positive for blood in stool and hematochezia. Negative for abdominal pain and diarrhea.  Genitourinary: Negative for frequency and  hematuria.  Musculoskeletal: Negative for back pain.  Skin: Negative for rash.  Neurological: Negative for seizures and headaches.  Hematological: Negative.   Psychiatric/Behavioral: Negative for hallucinations.    Allergies  Sulfa antibiotics  Home Medications   Current Outpatient Rx  Name  Route  Sig  Dispense  Refill  . IPRATROPIUM-ALBUTEROL 18-103 MCG/ACT IN AERO   Inhalation   Inhale 2 puffs into the lungs every 6 (six) hours as needed. For shortness of breath         . AMLODIPINE BESYLATE 10 MG PO TABS   Oral   Take 10 mg by mouth daily.           . BUDESONIDE-FORMOTEROL FUMARATE 160-4.5 MCG/ACT IN AERO      Take 2 puffs first thing in am and then another 2 puffs about 12 hours later.   1 Inhaler   12   . CHOLINE FENOFIBRATE 135 MG PO CPDR   Oral   Take 135 mg by mouth daily.         . CYANOCOBALAMIN 1000 MCG/ML [place] SOLN   Intramuscular   Inject 1,000 mcg into the muscle every 30 (thirty) days.         Marland Kitchen EXENATIDE 5 MCG/0.02ML [place] SOLN   Subcutaneous   Inject 5 mcg into the skin every morning.         Marland Kitchen GLIPIZIDE 10 MG PO TABS   Oral   Take 10  mg by mouth 2 (two) times daily before a meal.           . HYDROCHLOROTHIAZIDE 25 MG PO TABS   Oral   Take 25 mg by mouth daily.          . INSULIN GLARGINE 100 UNIT/ML [place] SOLN   Subcutaneous   Inject 60 Units into the skin at bedtime.          . INSULIN ISOPHANE & REGULAR (70-30) 100 UNIT/ML [place] SUSP   Subcutaneous   Inject 10-18 Units into the skin 3 (three) times daily. Sliding scale         . METFORMIN HCL 1000 MG PO TABS   Oral   Take 1,000 mg by mouth 2 (two) times daily with a meal.           . OMEPRAZOLE 20 MG PO CPDR   Oral   Take 1 capsule (20 mg total) by mouth daily.   30 capsule   11   . OXYCODONE-ACETAMINOPHEN 10-325 MG PO TABS   Oral   Take 1 tablet by mouth every 4 (four) hours as needed for pain.   30 tablet   0   . PRAVASTATIN SODIUM 20 MG PO TABS   Oral   Take  20 mg by mouth daily.         Marland Kitchen SITAGLIPTIN PHOSPHATE 25 MG PO TABS   Oral   Take 25 mg by mouth daily.           . SUCRALFATE 1 G PO TABS   Oral   Take 1 g by mouth 4 (four) times daily.         Marland Kitchen HYDROCODONE-ACETAMINOPHEN 5-325 MG PO TABS   Oral   Take 1 tablet by mouth every 6 (six) hours as needed for pain.   20 tablet   0     BP 124/74  Pulse 97  Temp 97.9 F (36.6 C) (Oral)  Resp 24  Ht 6\' 1"  (1.854 m)  Wt 372 lb (168.738 kg)  BMI 49.08 kg/m2  SpO2 91%  Physical Exam  Constitutional: He is oriented to person, place, and time. He appears well-developed.  HENT:  Head: Normocephalic and atraumatic.  Eyes: Conjunctivae normal and EOM are normal. No scleral icterus.  Neck: Neck supple. No thyromegaly present.  Cardiovascular: Normal rate and regular rhythm.  Exam reveals no gallop and no friction rub.   No murmur heard. Pulmonary/Chest: No stridor. He has no wheezes. He has no rales. He exhibits no tenderness.  Abdominal: He exhibits no distension. There is no tenderness. There is no rebound.  Genitourinary:       Hem pos  Musculoskeletal: Normal range of motion. He exhibits no edema.  Lymphadenopathy:    He has no cervical adenopathy.  Neurological: He is oriented to person, place, and time. Coordination normal.  Skin: No rash noted. No erythema.  Psychiatric: He has a normal mood and affect. His behavior is normal.    ED Course  Procedures (including critical care time)  Labs Reviewed  COMPREHENSIVE METABOLIC PANEL - Abnormal; Notable for the following:    Sodium 130 (*)     Chloride 95 (*)     Glucose, Bld 343 (*)     All other components within normal limits  CBC WITH DIFFERENTIAL  LAB REPORT - SCANNED   Ct Abdomen Pelvis W Contrast  06/19/2012  *RADIOLOGY REPORT*  Clinical Data: Abdominal pain and bloody stools.  CT ABDOMEN AND PELVIS  WITH CONTRAST  Technique:  Multidetector CT imaging of the abdomen and pelvis was performed following the  standard protocol during bolus administration of intravenous contrast.  Contrast: OMNIPAQUE IOHEXOL 300 MG/ML  SOLN  Comparison: CT of the abdomen and pelvis performed 03/31/2011  Findings: Minimal bibasilar atelectasis is noted.  There is diffuse fatty infiltration within the within the liver, with mild peripheral sparing.  Mild focal sparing is also noted at the gallbladder fossa.  The liver is otherwise unremarkable in appearance.  The spleen is within normal limits.  The gallbladder is grossly unremarkable.  The pancreas and right adrenal gland are within normal limits.  Note is made of a relatively large 4.5 cm low density mass at the left adrenal gland, most compatible with an adrenal adenoma; this demonstrates a small focus of centrally increased attenuation, nonspecific in appearance.  The kidneys are unremarkable in appearance.  There is no evidence of hydronephrosis.  No renal or ureteral stones are seen.  No perinephric stranding is appreciated.  No free fluid is identified.  The small bowel is unremarkable in appearance.  The stomach is within normal limits.  No acute vascular abnormalities are seen.  Minimal scattered calcification is noted along the distal abdominal aorta and its branches.  The appendix is normal in caliber, without evidence for appendicitis.  The colon is unremarkable in appearance.  The bladder is mildly distended and grossly unremarkable in appearance.  The prostate remains normal in size.  No inguinal lymphadenopathy is seen.  No acute osseous abnormalities are identified.  IMPRESSION:  1.  No acute abnormality seen within the abdomen or pelvis. 2.  4.5 cm left adrenal adenoma noted, with a small nonspecific focus of centrally increased attenuation. 3.  Diffuse fatty infiltration within the liver. 4.  Minimal scattered calcification along the distal abdominal aorta and its branches, perhaps mildly advanced for age.   Original Report Authenticated By: Tonia Ghent, M.D.       1. Rectal bleeding   2. Adrenal adenoma       MDM          [name], MD 06/20/12 [phone]

## 2012-06-25 ENCOUNTER — Encounter: Payer: Self-pay | Admitting: Internal Medicine

## 2012-06-26 ENCOUNTER — Other Ambulatory Visit: Payer: Self-pay | Admitting: Internal Medicine

## 2012-06-26 DIAGNOSIS — G4733 Obstructive sleep apnea (adult) (pediatric): Secondary | ICD-10-CM

## 2012-07-05 ENCOUNTER — Encounter: Payer: Self-pay | Admitting: Internal Medicine

## 2012-07-07 ENCOUNTER — Encounter (HOSPITAL_COMMUNITY): Payer: Self-pay | Admitting: *Deleted

## 2012-07-07 ENCOUNTER — Emergency Department (HOSPITAL_COMMUNITY)
Admission: EM | Admit: 2012-07-07 | Discharge: 2012-07-07 | Disposition: A | Discharge status: Home / Self Care | Payer: Medicaid Other | Attending: Emergency Medicine | Admitting: Emergency Medicine

## 2012-07-07 DIAGNOSIS — Z872 Personal history of diseases of the skin and subcutaneous tissue: Secondary | ICD-10-CM | POA: Insufficient documentation

## 2012-07-07 DIAGNOSIS — G4733 Obstructive sleep apnea (adult) (pediatric): Secondary | ICD-10-CM | POA: Insufficient documentation

## 2012-07-07 DIAGNOSIS — N498 Inflammatory disorders of other specified male genital organs: Secondary | ICD-10-CM | POA: Insufficient documentation

## 2012-07-07 DIAGNOSIS — Z794 Long term (current) use of insulin: Secondary | ICD-10-CM | POA: Insufficient documentation

## 2012-07-07 DIAGNOSIS — Z79899 Other long term (current) drug therapy: Secondary | ICD-10-CM | POA: Insufficient documentation

## 2012-07-07 DIAGNOSIS — Z8709 Personal history of other diseases of the respiratory system: Secondary | ICD-10-CM | POA: Insufficient documentation

## 2012-07-07 DIAGNOSIS — Z87891 Personal history of nicotine dependence: Secondary | ICD-10-CM | POA: Insufficient documentation

## 2012-07-07 DIAGNOSIS — G8929 Other chronic pain: Secondary | ICD-10-CM | POA: Insufficient documentation

## 2012-07-07 DIAGNOSIS — C96A Histiocytic sarcoma: Secondary | ICD-10-CM | POA: Insufficient documentation

## 2012-07-07 DIAGNOSIS — L0291 Cutaneous abscess, unspecified: Secondary | ICD-10-CM

## 2012-07-07 DIAGNOSIS — J45909 Unspecified asthma, uncomplicated: Secondary | ICD-10-CM | POA: Insufficient documentation

## 2012-07-07 DIAGNOSIS — I10 Essential (primary) hypertension: Secondary | ICD-10-CM | POA: Insufficient documentation

## 2012-07-07 DIAGNOSIS — IMO0002 Reserved for concepts with insufficient information to code with codable children: Secondary | ICD-10-CM | POA: Insufficient documentation

## 2012-07-07 DIAGNOSIS — Z8669 Personal history of other diseases of the nervous system and sense organs: Secondary | ICD-10-CM | POA: Insufficient documentation

## 2012-07-07 MED ORDER — DOXYCYCLINE HYCLATE 100 MG PO TABS
100.0000 mg | ORAL_TABLET | Freq: Once | ORAL | Status: AC
  Administered 2012-07-07: 100 mg via ORAL
  Filled 2012-07-07: qty 1

## 2012-07-07 MED ORDER — OXYCODONE-ACETAMINOPHEN 5-325 MG PO TABS
2.0000 | ORAL_TABLET | Freq: Once | ORAL | Status: AC
  Administered 2012-07-07: 2 via ORAL
  Filled 2012-07-07: qty 2

## 2012-07-07 MED ORDER — OXYCODONE-ACETAMINOPHEN 5-325 MG PO TABS: 1.0000 | ORAL_TABLET | ORAL | Status: AC | PRN

## 2012-07-07 MED ORDER — DOXYCYCLINE HYCLATE 100 MG PO CAPS: 100.0000 mg | ORAL_CAPSULE | Freq: Two times a day (BID) | ORAL | Status: DC

## 2012-07-07 NOTE — ED Provider Notes (Signed)
History     CSN: [id number]  Arrival date & time 07/07/12  1046   First MD Initiated Contact with Patient 07/07/12 1146      Chief Complaint  Patient presents with  . Abscess    (Consider location/radiation/quality/duration/timing/severity/associated sxs/prior treatment) Patient is a 42 y.o. male presenting with abscess. The history is provided by the patient.  Abscess  This is a recurrent problem. The current episode started less than one week ago. The onset was gradual. The problem occurs continuously. The problem has been gradually improving. Affected Location: right scrotum. The problem is mild. The abscess is characterized by painfulness and draining. It is unknown what he was exposed to. Pertinent negatives include no anorexia, no fever, no diarrhea and no vomiting. Past medical history comments: hx of frequent abscesses. There were no sick contacts. He has received no recent medical care.    Past Medical History  Diagnosis Date  . Hypertension   . Diabetes mellitus   . Abscess   . Chronic back pain   . Sciatica   . Asthma   . Bronchitis   . OSA (obstructive sleep apnea)     History reviewed. No pertinent past surgical history.  Family History  Problem Relation Age of Onset  . Hypertension Mother   . Diabetes Mother   . Hypertension Father   . Diabetes Father   . Diabetes Brother   . Hypertension Brother   . Colon cancer Sister     History  Substance Use Topics  . Smoking status: Former Smoker -- 0.5 packs/day for 24 years    Types: Cigarettes    Start date: 06/20/1987    Quit date: 12/18/2011  . Smokeless tobacco: Never Used  . Alcohol Use: No      Review of Systems  Constitutional: Negative for fever and chills.  Gastrointestinal: Negative for nausea, vomiting, diarrhea and anorexia.  Musculoskeletal: Negative for joint swelling and arthralgias.  Skin: Positive for color change.       Abscess   Hematological: Negative for adenopathy.  All other  systems reviewed and are negative.    Allergies  Sulfa antibiotics  Home Medications   Current Outpatient Rx  Name  Route  Sig  Dispense  Refill  . IPRATROPIUM-ALBUTEROL 18-103 MCG/ACT IN AERO   Inhalation   Inhale 2 puffs into the lungs every 6 (six) hours as needed. For shortness of breath         . AMLODIPINE BESYLATE 10 MG PO TABS   Oral   Take 10 mg by mouth daily.           . BUDESONIDE-FORMOTEROL FUMARATE 160-4.5 MCG/ACT IN AERO      Take 2 puffs first thing in am and then another 2 puffs about 12 hours later.   1 Inhaler   12   . CHOLINE FENOFIBRATE 135 MG PO CPDR   Oral   Take 135 mg by mouth daily.         . CYANOCOBALAMIN 1000 MCG/ML IJ SOLN   Intramuscular   Inject 1,000 mcg into the muscle every 30 (thirty) days.         Marland Kitchen EXENATIDE 5 MCG/0.02ML [place] SOLN   Subcutaneous   Inject 5 mcg into the skin every morning.         Marland Kitchen GLIPIZIDE 10 MG PO TABS   Oral   Take 10 mg by mouth 2 (two) times daily before a meal.           .  HYDROCHLOROTHIAZIDE 25 MG PO TABS   Oral   Take 25 mg by mouth daily.          Marland Kitchen HYDROCODONE-ACETAMINOPHEN 5-325 MG PO TABS   Oral   Take 1 tablet by mouth every 6 (six) hours as needed for pain.   20 tablet   0   . INSULIN GLARGINE 100 UNIT/ML [place] SOLN   Subcutaneous   Inject 60 Units into the skin at bedtime.          . INSULIN ISOPHANE & REGULAR (70-30) 100 UNIT/ML [place] SUSP   Subcutaneous   Inject 10-18 Units into the skin 3 (three) times daily. Sliding scale         . METFORMIN HCL 1000 MG PO TABS   Oral   Take 1,000 mg by mouth 2 (two) times daily with a meal.           . OMEPRAZOLE 20 MG PO CPDR   Oral   Take 1 capsule (20 mg total) by mouth daily.   30 capsule   11   . OXYCODONE-ACETAMINOPHEN 10-325 MG PO TABS   Oral   Take 1 tablet by mouth every 4 (four) hours as needed for pain.   30 tablet   0   . PRAVASTATIN SODIUM 20 MG PO TABS   Oral   Take 20 mg by mouth daily.         Marland Kitchen  SITAGLIPTIN PHOSPHATE 25 MG PO TABS   Oral   Take 25 mg by mouth daily.           . SUCRALFATE 1 G PO TABS   Oral   Take 1 g by mouth 4 (four) times daily.           BP 132/65  Pulse 99  Temp 98.3 F (36.8 C)  Resp 20  Ht 6\' 1"  (1.854 m)  Wt 362 lb (164.202 kg)  BMI 47.76 kg/m2  SpO2 95%  Physical Exam  Nursing note and vitals reviewed. Constitutional: He is oriented to person, place, and time. He appears well-developed and well-nourished. No distress.  HENT:  Head: Normocephalic and atraumatic.  Cardiovascular: Normal rate, regular rhythm, normal heart sounds and intact distal pulses.   Pulmonary/Chest: Effort normal and breath sounds normal.  Abdominal: Soft. He exhibits no distension. There is no tenderness. There is no rebound and no guarding.  Genitourinary: Testes normal.          Small indurated nodule to the right scrotum.  Mild drainage present.  No fluctuance or erythema.    Musculoskeletal: He exhibits no edema and no tenderness.  Lymphadenopathy:       Right: No inguinal adenopathy present.       Left: No inguinal adenopathy present.  Neurological: He is alert and oriented to person, place, and time. He exhibits normal muscle tone. Coordination normal.  Skin: Skin is warm.    ED Course  Procedures (including critical care time)  Labs Reviewed - No data to display No results found.     MDM  Previous ED charts reviewed.  Small abscess to the right scrotum with mild drainage already present. Pt prefers not to have I&D at this time.  Agrees to return here in 2-3 days if the sx's are not improving.  Agrees to warm soaks.  Will prescribe doxycycline and percocet #20   Finley Chevez L. [place], [place] 07/08/12 2017

## 2012-07-07 NOTE — ED Notes (Signed)
Abscess to right testicle x 3-4 days.  

## 2012-07-07 NOTE — ED Notes (Signed)
Raised pustule on R lateral testicle, draining purulent fluid.

## 2012-07-07 NOTE — ED Notes (Signed)
Patient with no complaints at this time. Respirations even and unlabored. Skin warm/dry. Discharge instructions reviewed with patient at this time. Patient given opportunity to voice concerns/ask questions. Patient discharged at this time and left Emergency Department with steady gait.   

## 2012-07-09 NOTE — ED Provider Notes (Signed)
Medical screening examination/treatment/procedure(s) were performed by non-physician practitioner and as supervising physician I was immediately available for consultation/collaboration.   Joya Gaskins, MD 07/09/12 [phone]

## 2012-07-15 ENCOUNTER — Telehealth: Payer: Self-pay | Admitting: Internal Medicine

## 2012-07-15 DIAGNOSIS — G4733 Obstructive sleep apnea (adult) (pediatric): Secondary | ICD-10-CM

## 2012-07-15 NOTE — Telephone Encounter (Signed)
Pt's wife checking on status of results.  Shawn Newman

## 2012-07-15 NOTE — Telephone Encounter (Signed)
According to what's written on the form, we were supposed to repeat the study on 2lpm but I would also like pt to see one of our sleep docs to trouble shoot all this so go ahead and get him set up for 2lpm and refer to one of our sleep docs - it's not feasible to see a sleep doc in [place] and me treat with noct 02 here.  Other option is let the [employer] do both

## 2012-07-15 NOTE — Telephone Encounter (Signed)
Spoke with patient, patient requesting results of overnight sleep study.  Would like to know how he should proceed.    Appears study is in the "Media tab" in Epic.  Dr. Sherene Sires please advise, thank you

## 2012-07-15 NOTE — Telephone Encounter (Signed)
LMTCB

## 2012-07-16 NOTE — Telephone Encounter (Signed)
Pt has returned call & asked to be reached at [phone].  Shawn Newman

## 2012-07-16 NOTE — Telephone Encounter (Signed)
LMOM TCB x1 at the number provided below.

## 2012-07-16 NOTE — Telephone Encounter (Signed)
LMTCB x 1 

## 2012-07-16 NOTE — Telephone Encounter (Signed)
Pt returned call.  Advised of ONO results / recs as stated by MW below.  Pt okay with these recs and verbalized his understanding.  Sleep consult scheduled with [facility] for 2.12.14 @ 1145; pt aware to arrive early for paperwork; appt card mailed to pt's verified home address.  Per Lori's recs, pt to bring his CPAP machine and power cord to consult w/ [facility] (he was unsure if his machine is a ResMed or not).  Pt's gets CPAP thru [name].  Unsure if they provide O2 as well.  Will document this in new O2 order.  Pt aware he will be contacted by supplying company about setting up the O2.  New O2 start 2lpm bleed thru CPAP > order placed.  Will sign and forward to MW as Lorain Childes.

## 2012-07-22 ENCOUNTER — Telehealth: Payer: Self-pay | Admitting: Internal Medicine

## 2012-07-22 NOTE — Telephone Encounter (Signed)
Vicente Males from [facility] returning libby's call can be reached at [phone].Raylene Everts

## 2012-07-22 NOTE — Telephone Encounter (Signed)
Pt aware that we have sent the order to [facility] pharmacy in [place].  I will forward this message to [facility] to see if she can follow up on this.

## 2012-07-23 NOTE — Telephone Encounter (Signed)
Pam--requesting to speak to nurse regarding cpap.  [phone]

## 2012-07-23 NOTE — Telephone Encounter (Signed)
Pt has called back & states that they were contacted by [facility] pharmacy stating that they have not rec'd everything yet.  Would like to know what the hold up is & when to expect this be completed.  Can be reached at [phone].   Antionette Fairy

## 2012-07-23 NOTE — Telephone Encounter (Signed)
Called, spoke with pt.  Pt states he went to [name] but was advised they still need information from our office before they can set him up.  Libby, I see orders where faxed.  Will you pls make sure [name] has everything that is needed. Pt would like to get this set up today.  Thank you.

## 2012-07-24 NOTE — Telephone Encounter (Signed)
Spoke to pt there is a problem with [facility] phar doing the pt's 02 so pt agreed to use [facility], [facility] will take care o his 02 Shawn Newman

## 2012-07-28 ENCOUNTER — Emergency Department (HOSPITAL_COMMUNITY)
Admission: EM | Admit: 2012-07-28 | Discharge: 2012-07-29 | Disposition: A | Discharge status: Home / Self Care | Payer: Medicaid Other | Attending: Emergency Medicine | Admitting: Emergency Medicine

## 2012-07-28 ENCOUNTER — Encounter (HOSPITAL_COMMUNITY): Payer: Self-pay | Admitting: *Deleted

## 2012-07-28 DIAGNOSIS — Z794 Long term (current) use of insulin: Secondary | ICD-10-CM | POA: Insufficient documentation

## 2012-07-28 DIAGNOSIS — M5430 Sciatica, unspecified side: Secondary | ICD-10-CM | POA: Insufficient documentation

## 2012-07-28 DIAGNOSIS — I10 Essential (primary) hypertension: Secondary | ICD-10-CM | POA: Insufficient documentation

## 2012-07-28 DIAGNOSIS — Z79899 Other long term (current) drug therapy: Secondary | ICD-10-CM | POA: Insufficient documentation

## 2012-07-28 DIAGNOSIS — G4733 Obstructive sleep apnea (adult) (pediatric): Secondary | ICD-10-CM | POA: Insufficient documentation

## 2012-07-28 DIAGNOSIS — M5431 Sciatica, right side: Secondary | ICD-10-CM

## 2012-07-28 DIAGNOSIS — E1169 Type 2 diabetes mellitus with other specified complication: Secondary | ICD-10-CM | POA: Insufficient documentation

## 2012-07-28 DIAGNOSIS — Z87891 Personal history of nicotine dependence: Secondary | ICD-10-CM | POA: Insufficient documentation

## 2012-07-28 DIAGNOSIS — J45909 Unspecified asthma, uncomplicated: Secondary | ICD-10-CM | POA: Insufficient documentation

## 2012-07-28 DIAGNOSIS — R739 Hyperglycemia, unspecified: Secondary | ICD-10-CM

## 2012-07-28 DIAGNOSIS — Z8709 Personal history of other diseases of the respiratory system: Secondary | ICD-10-CM | POA: Insufficient documentation

## 2012-07-28 DIAGNOSIS — R52 Pain, unspecified: Secondary | ICD-10-CM | POA: Insufficient documentation

## 2012-07-28 DIAGNOSIS — G8929 Other chronic pain: Secondary | ICD-10-CM | POA: Insufficient documentation

## 2012-07-28 LAB — URINALYSIS, ROUTINE W REFLEX MICROSCOPIC
Glucose, UA: >1000 mg/dL — AB
Leukocytes, UA: NEGATIVE
Specific Gravity, Urine: 1.015 (ref 1.005–1.030)
pH: 6 (ref 5.0–8.0)

## 2012-07-28 LAB — BASIC METABOLIC PANEL
CO2: 26 mEq/L (ref 19–32)
Calcium: 9.7 mg/dL (ref 8.4–10.5)
Chloride: 98 mEq/L (ref 96–112)
Sodium: 135 mEq/L (ref 135–145)

## 2012-07-28 LAB — URINE MICROSCOPIC-ADD ON

## 2012-07-28 MED ORDER — OXYCODONE-ACETAMINOPHEN 5-325 MG PO TABS
2.0000 | ORAL_TABLET | Freq: Once | ORAL | Status: AC
  Administered 2012-07-28: 2 via ORAL
  Filled 2012-07-28: qty 2

## 2012-07-28 NOTE — ED Provider Notes (Signed)
History     CSN: [id number]  Arrival date & time 07/28/12  2234   First MD Initiated Contact with Patient 07/28/12 2255      Chief Complaint  Patient presents with  . Back Pain    (Consider location/radiation/quality/duration/timing/severity/associated sxs/prior treatment) HPI Comments: Shawn Newman is a 42 y.o. Male presenting with acute on chronic low back pain which has which has been present since yesterday evening.   Patient denies any new injury specifically.  He has a history of intermittent sciatica with radiation into his right posterior upper thigh.  There has been no weakness or numbness in the lower extremities and no urinary or bowel retention.  He does report having nocturnal incontinence of urine for the past "month or two".  He denies daytime incontinence,  Denies urinary frequency or pain.  His blood glucose level tonight was 311 prior to arrival,  Which is states is average for his blood glucose levels.  He has had no nausea, vomiting or abdominal pain.  Patient does not have a history of cancer or IVDU.      The history is provided by the patient and the spouse.    Past Medical History  Diagnosis Date  . Hypertension   . Diabetes mellitus   . Abscess   . Chronic back pain   . Sciatica   . Asthma   . Bronchitis   . OSA (obstructive sleep apnea)     History reviewed. No pertinent past surgical history.  Family History  Problem Relation Age of Onset  . Hypertension Mother   . Diabetes Mother   . Hypertension Father   . Diabetes Father   . Diabetes Brother   . Hypertension Brother   . Colon cancer Sister     History  Substance Use Topics  . Smoking status: Former Smoker -- 0.50 packs/day for 24 years    Types: Cigarettes    Start date: 06/20/1987    Quit date: 12/18/2011  . Smokeless tobacco: Never Used  . Alcohol Use: No      Review of Systems  Constitutional: Negative for fever and chills.  Respiratory: Negative for shortness of  breath.   Cardiovascular: Negative for chest pain and leg swelling.  Gastrointestinal: Negative for nausea, vomiting, abdominal pain, constipation and abdominal distention.  Genitourinary: Negative for dysuria, urgency, frequency, flank pain and difficulty urinating.  Musculoskeletal: Positive for back pain. Negative for joint swelling and gait problem.  Skin: Negative for rash.  Neurological: Negative for weakness and numbness.    Allergies  Sulfa antibiotics  Home Medications   Current Outpatient Rx  Name  Route  Sig  Dispense  Refill  . albuterol-ipratropium (COMBIVENT) 18-103 MCG/ACT inhaler   Inhalation   Inhale 2 puffs into the lungs every 6 (six) hours as needed. For shortness of breath         . amLODipine (NORVASC) 10 MG tablet   Oral   Take 10 mg by mouth daily.           . budesonide-formoterol (SYMBICORT) 160-4.5 MCG/ACT inhaler      Take 2 puffs first thing in am and then another 2 puffs about 12 hours later.   1 Inhaler   12   . Choline Fenofibrate (TRILIPIX) 135 MG capsule   Oral   Take 135 mg by mouth daily.         Marland Kitchen exenatide (BYETTA) 5 MCG/0.02ML SOLN   Subcutaneous   Inject 5 mcg into the skin every morning.         Marland Kitchen  glipiZIDE (GLUCOTROL) 10 MG tablet   Oral   Take 10 mg by mouth 2 (two) times daily before a meal.           . hydrochlorothiazide 25 MG tablet   Oral   Take 25 mg by mouth daily.          . insulin glargine (LANTUS SOLOSTAR) 100 UNIT/ML injection   Subcutaneous   Inject 60 Units into the skin at bedtime.          . insulin NPH-insulin regular (HUMULIN 70/30 PEN) (70-30) 100 UNIT/ML injection   Subcutaneous   Inject 10-18 Units into the skin 3 (three) times daily. Sliding scale         . metFORMIN (GLUCOPHAGE) 1000 MG tablet   Oral   Take 1,000 mg by mouth 2 (two) times daily with a meal.           . omeprazole (PRILOSEC) 20 MG capsule   Oral   Take 1 capsule (20 mg total) by mouth daily.   30 capsule    11   . pravastatin (PRAVACHOL) 20 MG tablet   Oral   Take 20 mg by mouth daily.         . sitaGLIPtin (JANUVIA) 25 MG tablet   Oral   Take 25 mg by mouth daily.           . cyanocobalamin (,VITAMIN B-12,) 1000 MCG/ML injection   Intramuscular   Inject 1,000 mcg into the muscle every 30 (thirty) days.         Marland Kitchen oxyCODONE-acetaminophen (PERCOCET/ROXICET) 5-325 MG per tablet   Oral   Take 1-2 tablets by mouth every 4 (four) hours as needed for pain.   24 tablet   0     BP 125/66  Pulse 89  Temp(Src) 97.8 F (36.6 C) (Oral)  Resp 20  Ht 6\' 1"  (1.854 m)  Wt 363 lb (164.656 kg)  BMI 47.9 kg/m2  SpO2 98%  Physical Exam  Nursing note and vitals reviewed. Constitutional: He appears well-developed and well-nourished.  HENT:  Head: Normocephalic.  Eyes: Conjunctivae are normal.  Neck: Normal range of motion. Neck supple.  Cardiovascular: Normal rate and intact distal pulses.   Pedal pulses normal.  Pulmonary/Chest: Effort normal.  Abdominal: Soft. Bowel sounds are normal. He exhibits no mass. There is no tenderness. There is no guarding.  Obese abdomen,  Making exam difficult,  Non tender,  No mass.  Musculoskeletal: Normal range of motion. He exhibits no edema.       Lumbar back: He exhibits tenderness. He exhibits no swelling, no edema and no spasm.  Neurological: He is alert. He has normal strength. He displays no atrophy and no tremor. No sensory deficit. Gait normal.  Reflex Scores:      Patellar reflexes are 2+ on the right side and 2+ on the left side.      Achilles reflexes are 2+ on the right side and 2+ on the left side. No strength deficit noted in hip and knee flexor and extensor muscle groups.  Ankle flexion and extension intact.  Skin: Skin is warm and dry.  Psychiatric: He has a normal mood and affect.    ED Course  Procedures (including critical care time)  Labs Reviewed  URINALYSIS, ROUTINE W REFLEX MICROSCOPIC - Abnormal; Notable for the  following:    Glucose, UA >1000 (*)    All other components within normal limits  BASIC METABOLIC PANEL - Abnormal; Notable for the following:  Glucose, Bld 281 (*)    All other components within normal limits  URINE MICROSCOPIC-ADD ON   No results found.   1. Sciatica of right side   2. Hyperglycemia without ketosis     12:53 AM.  Pt given oxycodone 2 tabs of 5/325 mg strength.    MDM  Patients labs and/or radiological studies were reviewed during the medical decision making and disposition process. Pt prescribed oxycodone,  Encouraged rest,  Heating pad. F/u with pcp if not better over the next week.    No neuro deficit on exam or by history to suggest emergent or surgical presentation.  Also discussed worsened sx that should prompt immediate re-evaluation including distal weakness, bowel/bladder retention/incontinence.              Burgess Amor, [place] 07/29/12 [phone]

## 2012-07-28 NOTE — ED Notes (Signed)
Pt c/o "unbearable" lower back pain all day and states his blood sugar was 311 just pta.

## 2012-07-29 MED ORDER — METHOCARBAMOL 500 MG PO TABS
1000.0000 mg | ORAL_TABLET | Freq: Once | ORAL | Status: AC
  Administered 2012-07-29: 1000 mg via ORAL
  Filled 2012-07-29: qty 2

## 2012-07-29 MED ORDER — OXYCODONE-ACETAMINOPHEN 5-325 MG PO TABS: 1.0000 | ORAL_TABLET | ORAL | Status: DC | PRN

## 2012-07-29 MED ORDER — METHOCARBAMOL 500 MG PO TABS: 1000.0000 mg | ORAL_TABLET | Freq: Four times a day (QID) | ORAL | Status: AC

## 2012-07-29 NOTE — ED Provider Notes (Signed)
Medical screening examination/treatment/procedure(s) were performed by non-physician practitioner and as supervising physician I was immediately available for consultation/collaboration.   Atalya Dano L Rowe Warman, MD 07/29/12 0706 

## 2012-07-31 ENCOUNTER — Institutional Professional Consult (permissible substitution): Payer: Medicaid Other | Admitting: Pulmonary Disease

## 2012-08-15 ENCOUNTER — Encounter (HOSPITAL_COMMUNITY): Payer: Self-pay

## 2012-08-15 ENCOUNTER — Emergency Department (HOSPITAL_COMMUNITY): Payer: Medicaid Other

## 2012-08-15 ENCOUNTER — Emergency Department (HOSPITAL_COMMUNITY)
Admission: EM | Admit: 2012-08-15 | Discharge: 2012-08-15 | Disposition: A | Discharge status: Home / Self Care | Payer: Medicaid Other | Attending: Emergency Medicine | Admitting: Emergency Medicine

## 2012-08-15 DIAGNOSIS — M549 Dorsalgia, unspecified: Secondary | ICD-10-CM

## 2012-08-15 DIAGNOSIS — M5430 Sciatica, unspecified side: Secondary | ICD-10-CM | POA: Insufficient documentation

## 2012-08-15 DIAGNOSIS — Z8739 Personal history of other diseases of the musculoskeletal system and connective tissue: Secondary | ICD-10-CM | POA: Insufficient documentation

## 2012-08-15 DIAGNOSIS — I10 Essential (primary) hypertension: Secondary | ICD-10-CM | POA: Insufficient documentation

## 2012-08-15 DIAGNOSIS — M545 Low back pain, unspecified: Secondary | ICD-10-CM | POA: Insufficient documentation

## 2012-08-15 DIAGNOSIS — M5431 Sciatica, right side: Secondary | ICD-10-CM

## 2012-08-15 DIAGNOSIS — Z794 Long term (current) use of insulin: Secondary | ICD-10-CM | POA: Insufficient documentation

## 2012-08-15 DIAGNOSIS — E119 Type 2 diabetes mellitus without complications: Secondary | ICD-10-CM | POA: Insufficient documentation

## 2012-08-15 DIAGNOSIS — Z87891 Personal history of nicotine dependence: Secondary | ICD-10-CM | POA: Insufficient documentation

## 2012-08-15 DIAGNOSIS — J45909 Unspecified asthma, uncomplicated: Secondary | ICD-10-CM | POA: Insufficient documentation

## 2012-08-15 DIAGNOSIS — M79609 Pain in unspecified limb: Secondary | ICD-10-CM | POA: Insufficient documentation

## 2012-08-15 DIAGNOSIS — Z79899 Other long term (current) drug therapy: Secondary | ICD-10-CM | POA: Insufficient documentation

## 2012-08-15 DIAGNOSIS — Z8669 Personal history of other diseases of the nervous system and sense organs: Secondary | ICD-10-CM | POA: Insufficient documentation

## 2012-08-15 DIAGNOSIS — Z872 Personal history of diseases of the skin and subcutaneous tissue: Secondary | ICD-10-CM | POA: Insufficient documentation

## 2012-08-15 LAB — GLUCOSE, CAPILLARY: Glucose-Capillary: 305 mg/dL — ABNORMAL HIGH (ref 70–99)

## 2012-08-15 MED ORDER — OXYCODONE-ACETAMINOPHEN 5-325 MG PO TABS: 2.0000 | ORAL_TABLET | Freq: Three times a day (TID) | ORAL | Status: DC | PRN

## 2012-08-15 MED ORDER — CYCLOBENZAPRINE HCL 10 MG PO TABS: 10.0000 mg | ORAL_TABLET | Freq: Three times a day (TID) | ORAL | Status: DC | PRN

## 2012-08-15 MED ORDER — HYDROMORPHONE HCL PF 1 MG/ML IJ SOLN
1.0000 mg | Freq: Once | INTRAMUSCULAR | Status: AC
  Administered 2012-08-15: 1 mg via INTRAMUSCULAR
  Filled 2012-08-15: qty 1

## 2012-08-15 NOTE — ED Notes (Signed)
Pharmacy at bedside

## 2012-08-15 NOTE — ED Provider Notes (Signed)
History    This chart was scribed for Shawn Lennert, MD, by Shawn Newman, ED scribe. The patient was seen in room APA03[facility] and the patient's care was started at 0911.    CSN: [id number]  Arrival date & time 08/15/12  0850   First MD Initiated Contact with Patient 08/15/12 [phone]      Chief Complaint  Patient presents with  . Back Pain  . Leg Pain    (Consider location/radiation/quality/duration/timing/severity/associated sxs/prior treatment) Patient is a 42 y.o. male presenting with back pain. The history is provided by the patient. No language interpreter was used.  Back Pain Location:  Lumbar spine Radiates to:  R posterior upper leg Pain severity:  Severe Pain is:  Unable to specify Onset quality:  Sudden Duration:  3 days Associated symptoms: no abdominal pain, no chest pain and no headaches     Shawn Newman is a 42 y.o. male with a h/o of chronic back pain and sciatica who presents to the Emergency Department complaining of sudden onset, constant, severe lower back pain that radiates down his right leg and began 3 days ago. He has a h/o of DM and hypertension and also complains that his glucose levels may be elevated. He reports that he is followed by Dr. Clinton Sawyer who prescribed his Lortab 10 for the pain, but reports that the only thing that relieves it is Percocet, which he received on his last ED visit on 02/09.   Past Medical History  Diagnosis Date  . Hypertension   . Diabetes mellitus   . Abscess   . Chronic back pain   . Sciatica   . Asthma   . Bronchitis   . OSA (obstructive sleep apnea)     History reviewed. No pertinent past surgical history.  Family History  Problem Relation Age of Onset  . Hypertension Mother   . Diabetes Mother   . Hypertension Father   . Diabetes Father   . Diabetes Brother   . Hypertension Brother   . Colon cancer Sister     History  Substance Use Topics  . Smoking status: Former Smoker -- 0.50 packs/day for 24  years    Types: Cigarettes    Start date: 06/20/1987    Quit date: 12/18/2011  . Smokeless tobacco: Never Used  . Alcohol Use: No      Review of Systems  Constitutional: Negative for fatigue.  HENT: Negative for congestion, sinus pressure and ear discharge.   Eyes: Negative for discharge.  Respiratory: Negative for cough.   Cardiovascular: Negative for chest pain.  Gastrointestinal: Negative for abdominal pain and diarrhea.  Genitourinary: Negative for frequency and hematuria.  Musculoskeletal: Positive for back pain.  Skin: Negative for rash.  Neurological: Negative for seizures and headaches.  Psychiatric/Behavioral: Negative for hallucinations.  All other systems reviewed and are negative.    Allergies  Sulfa antibiotics  Home Medications   Current Outpatient Rx  Name  Route  Sig  Dispense  Refill  . albuterol-ipratropium (COMBIVENT) 18-103 MCG/ACT inhaler   Inhalation   Inhale 2 puffs into the lungs every 6 (six) hours as needed. For shortness of breath         . amLODipine (NORVASC) 10 MG tablet   Oral   Take 10 mg by mouth daily.           . budesonide-formoterol (SYMBICORT) 160-4.5 MCG/ACT inhaler      Take 2 puffs first thing in am and then another 2 puffs about 12 hours  later.   1 Inhaler   12   . Choline Fenofibrate (TRILIPIX) 135 MG capsule   Oral   Take 135 mg by mouth daily.         . cyanocobalamin (,VITAMIN B-12,) 1000 MCG/ML injection   Intramuscular   Inject 1,000 mcg into the muscle every 30 (thirty) days.         Marland Kitchen exenatide (BYETTA) 5 MCG/0.02ML SOLN   Subcutaneous   Inject 5 mcg into the skin every morning.         Marland Kitchen glipiZIDE (GLUCOTROL) 10 MG tablet   Oral   Take 10 mg by mouth 2 (two) times daily before a meal.           . hydrochlorothiazide 25 MG tablet   Oral   Take 25 mg by mouth daily.          . insulin glargine (LANTUS SOLOSTAR) 100 UNIT/ML injection   Subcutaneous   Inject 60 Units into the skin at  bedtime.          . insulin NPH-insulin regular (HUMULIN 70/30 PEN) (70-30) 100 UNIT/ML injection   Subcutaneous   Inject 10-18 Units into the skin 3 (three) times daily. Sliding scale         . metFORMIN (GLUCOPHAGE) 1000 MG tablet   Oral   Take 1,000 mg by mouth 2 (two) times daily with a meal.           . omeprazole (PRILOSEC) 20 MG capsule   Oral   Take 1 capsule (20 mg total) by mouth daily.   30 capsule   11   . oxyCODONE-acetaminophen (PERCOCET/ROXICET) 5-325 MG per tablet   Oral   Take 1-2 tablets by mouth every 4 (four) hours as needed for pain.   24 tablet   0   . pravastatin (PRAVACHOL) 20 MG tablet   Oral   Take 20 mg by mouth daily.         . sitaGLIPtin (JANUVIA) 25 MG tablet   Oral   Take 25 mg by mouth daily.             BP 132/70  Pulse 110  Temp(Src) 98.2 F (36.8 C) (Oral)  Resp 18  SpO2 96%  Physical Exam  Nursing note and vitals reviewed. Constitutional: He is oriented to person, place, and time. He appears well-developed.  HENT:  Head: Normocephalic and atraumatic.  Eyes: Conjunctivae and EOM are normal. No scleral icterus.  Neck: Neck supple. No thyromegaly present.  Cardiovascular: Normal rate and regular rhythm.  Exam reveals no gallop and no friction rub.   No murmur heard. Pulmonary/Chest: No stridor. He has no wheezes. He has no rales. He exhibits no tenderness.  Abdominal: He exhibits no distension. There is no tenderness. There is no rebound.  Musculoskeletal: Normal range of motion. He exhibits tenderness. He exhibits no edema.  Positive straight leg raise on the right. Tender over the lumbar spine.  Lymphadenopathy:    He has no cervical adenopathy.  Neurological: He is oriented to person, place, and time. Coordination normal.  Skin: No rash noted. No erythema.  Psychiatric: He has a normal mood and affect. His behavior is normal.    ED Course  Procedures (including critical care time)  DIAGNOSTIC STUDIES: Oxygen  Saturation is 96% on room air, adequate  by my interpretation.    COORDINATION OF CARE:  09:22- Discussed planned course of treatment with the patient, including Dilaudid, a lumbar spine X-ray, and glucose  capillary, who is agreeable at this time.  09:30- Medication Order- hydromorphone (dilaudid) injection 1 mg- once.  11:46- Recheck- Discussed negative X-ray findings. He reports that he is out of his current pain medication. He requested 5-325mg   Percocet, but reports that the 10s provide more relief. He denies having ever tried a muscle relaxer.  Results for orders placed during the hospital encounter of 08/15/12  GLUCOSE, CAPILLARY      Result Value Range   Glucose-Capillary 305 (*) 70 - 99 mg/dL     Labs Reviewed  GLUCOSE, CAPILLARY - Abnormal; Notable for the following:    Glucose-Capillary 305 (*)    All other components within normal limits   Dg Lumbar Spine Complete  08/15/2012  *RADIOLOGY REPORT*  Clinical Data: Low back and right sciatic pain  LUMBAR SPINE - COMPLETE 4+ VIEW  Comparison: 02/10/2011  Findings: Five non-rib bearing lumbar vertebrae. Osseous mineralization grossly normal for technique. Vertebral body and disc space heights maintained. No acute fracture, subluxation or bone destruction. No spondylolysis. Minimal atherosclerotic calcification aorta.  IMPRESSION: No acute abnormalities.   Original Report Authenticated By: Ulyses Southward, M.D.      No diagnosis found.    MDM  The chart was scribed for me under my direct supervision.  I personally performed the history, physical, and medical decision making and all procedures in the evaluation of this patient.Shawn Lennert, MD 08/15/12 [id number]

## 2012-08-15 NOTE — ED Notes (Signed)
Pt reports severe pain to his lower back and legs bilaterally for the past 3 days.  Pt also reports "my sugar may be high".

## 2012-08-15 NOTE — ED Notes (Signed)
Patients Glucose 305 MD made aware.

## 2012-08-15 NOTE — ED Notes (Signed)
Patient is waiting for MD for results.

## 2012-08-26 ENCOUNTER — Institutional Professional Consult (permissible substitution): Payer: Medicaid Other | Admitting: Pulmonary Disease

## 2012-09-17 ENCOUNTER — Encounter: Payer: Self-pay | Admitting: Pulmonary Disease

## 2012-09-17 ENCOUNTER — Telehealth: Payer: Self-pay | Admitting: *Deleted

## 2012-09-17 ENCOUNTER — Ambulatory Visit (INDEPENDENT_AMBULATORY_CARE_PROVIDER_SITE_OTHER): Payer: Medicaid Other | Admitting: Pulmonary Disease

## 2012-09-17 VITALS — BP 122/74 | HR 96 | Temp 97.6°F | Ht 73.0 in | Wt 384.4 lb

## 2012-09-17 DIAGNOSIS — G4733 Obstructive sleep apnea (adult) (pediatric): Secondary | ICD-10-CM

## 2012-09-17 NOTE — Progress Notes (Signed)
Subjective:    Patient ID: Shawn Newman, male    DOB: [birth year], 42 y.o.   MRN: [id number]  HPI  The patient is a 42 year old male who been asked to see for management of obstructive sleep apnea.  He was diagnosed with severe sleep apnea 4 years ago, but his sleep study is not available for my review.  He was started on CPAP at that time, and although he has not been compliant consistently, most recently he has tried to be.  Despite wearing the device consistently, he continues to have frequent awakenings at night, nonrestorative sleep, as well as excessive daytime sleepiness.  The patient states that his mask is at least 42 years old, and he has not had recent supplies.  He also wears oxygen through his CPAP machine.  He isn't sure if he snores through his device, or if he has had witnessed apneas despite wearing CPAP.  He has significant daytime sleepiness, and his Epworth score today is 16.  He tells me that his weight is up greater than 30 pounds over the last 4 years, and he has not had his pressure rechecked.   Sleep Questionnaire What time do you typically go to bed?( Between what hours) 11p-12a 11p-12a at 1012 on 09/17/12 by Nita Sells, CMA How long does it take you to fall asleep? 30 mins 30 mins at 1012 on 09/17/12 by Marjo Bicker Mabe, CMA How many times during the night do you wake up? 5 5 at 1012 on 09/17/12 by Nita Sells, CMA What time do you get out of bed to start your day? 0700 0700 at 1012 on 09/17/12 by Nita Sells, CMA Do you drive or operate heavy machinery in your occupation? No No at 1012 on 09/17/12 by Nita Sells, CMA How much has your weight changed (up or down) over the past two years? (In pounds) 130 lb (58.968 kg)130 lb (58.968 kg) 130-140+ increased at 1012 on 09/17/12 by Marjo Bicker Mabe, CMA Have you ever had a sleep study before? Yes Yes at 1012 on 09/17/12 by Nita Sells, CMA If yes, location of study? [facility] at 1012 on  09/17/12 by Nita Sells, CMA If yes, date of study? 2010 2010 at 1012 on 09/17/12 by Nita Sells, CMA Do you currently use CPAP? Yes Yes at 1012 on 09/17/12 by Marjo Bicker Mabe, CMA If so, what pressure? 17 17 at 1012 on 09/17/12 by Marjo Bicker Mabe, CMA Do you wear oxygen at any time? Yes Yes at 1012 on 09/17/12 by Marjo Bicker Mabe, CMA O2 Flow Rate (L/min) 2 L/min 2 L/min at 1012 on 09/17/12 by Marjo Bicker Mabe, CMA   Review of Systems  Constitutional: Negative for fever and unexpected weight change.  HENT: Positive for rhinorrhea and sneezing. Negative for ear pain, nosebleeds, congestion, sore throat, trouble swallowing, dental problem, postnasal drip and sinus pressure.   Eyes: Positive for pain ( burning), redness and itching.  Respiratory: Positive for shortness of breath. Negative for cough, chest tightness and wheezing.   Cardiovascular: Positive for leg swelling. Negative for palpitations.  Gastrointestinal: Negative for nausea and vomiting.  Genitourinary: Negative for dysuria.       Incontinence at night  Musculoskeletal: Positive for back pain. Negative for joint swelling.  Skin: Negative for rash.  Neurological: Positive for headaches.  Hematological: Does not bruise/bleed easily.  Psychiatric/Behavioral: Negative for dysphoric mood. The patient is not nervous/anxious.        Objective:  Physical Exam Constitutional: morbidly obese male,  no acute distress  HENT:  Nares patent without discharge, but large swollen turbinates.   Oropharynx without exudate, palate and uvula are very thick and elongated.   Eyes:  Perrla, eomi, no scleral icterus  Neck:  No JVD, no TMG  Cardiovascular:  Normal rate, regular rhythm, no rubs or gallops.  No murmurs        Intact distal pulses but decreased.   Pulmonary :  Normal breath sounds, no stridor or respiratory distress   No rales, rhonchi, or wheezing  Abdominal:  Soft, nondistended, bowel sounds present.  No tenderness  noted.   Musculoskeletal:  1+ lower extremity edema noted.  Lymph Nodes:  No cervical lymphadenopathy noted  Skin:  No cyanosis noted  Neurologic:  Awake but appears sleepy, moves all 4 extremities without obvious deficit.         Assessment & Plan:

## 2012-09-17 NOTE — Telephone Encounter (Signed)
Spoke with pt. States he takes the percocet for back pain. He will contact Dr. Mayford Knife office for rx and call office back if any further questions or concerns. Nothing further needed at this time.

## 2012-09-17 NOTE — Patient Instructions (Addendum)
Will get you a new mask, supplies, and have your machine checked.  Make sure you change your mask seals/cushions every 2-3 mos. Will put you on an automatic device for the next few weeks, and will get a download to determine your optimal pressure.  Work on aggressive weight loss as we discussed. I will call you once we receive your download, but call us if you do not feel things are better with new supplies and mask.   Please schedule followup with me in 3mos, then we will spread out.

## 2012-09-17 NOTE — Assessment & Plan Note (Signed)
The patient has a history of severe obstructive sleep apnea, for which he is on CPAP since 2004.  He has not been compliant over the years, but most recently has been trying to wear more consistently.  Despite this, he is continuing to have poor sleep, as well as significant daytime sleepiness.  Unfortunately, the patient's mask is over 42 years old, and he has not kept up with supplies.  He also has gained at least 30 pounds or more in the last 4 years.  He has not had any followup with his durable medical equipment provider, and therefore the functioning of his machine is unknown.  At this point, I think he needs to get a new mask and supplies, had his machine checked, and finally would like to optimize his pressure again on the automatic setting for a few weeks.  Finally, I have stressed to him the importance of aggressive weight loss.  He is considering bariatric surgery.

## 2012-09-17 NOTE — Telephone Encounter (Signed)
I haven't seen since xmas and didn't address this issue - if it's not a pulmonary problem he should discuss it with Dr Mayford Knife - if it is then ok to give enough until he comes back in asap to see me with all meds in hand

## 2012-09-17 NOTE — Telephone Encounter (Signed)
Patient seen today in office with Dr Shelle Iron for Sleep Consult. Requesting refills on Percocet 5-325 Take 1-2 tablets q4hr prn pain #24 Last filled 07/29/12 by [facility] Rx needs to be sent to [facility], [place]  Dr Sherene Sires please advise if okay to refill. Thanks.

## 2012-09-23 ENCOUNTER — Emergency Department (HOSPITAL_COMMUNITY): Payer: Medicaid Other

## 2012-09-23 ENCOUNTER — Emergency Department (HOSPITAL_COMMUNITY)
Admission: EM | Admit: 2012-09-23 | Discharge: 2012-09-24 | Disposition: A | Discharge status: Home / Self Care | Payer: Medicaid Other | Attending: Emergency Medicine | Admitting: Emergency Medicine

## 2012-09-23 ENCOUNTER — Encounter (HOSPITAL_COMMUNITY): Payer: Self-pay

## 2012-09-23 DIAGNOSIS — E119 Type 2 diabetes mellitus without complications: Secondary | ICD-10-CM | POA: Insufficient documentation

## 2012-09-23 DIAGNOSIS — M5430 Sciatica, unspecified side: Secondary | ICD-10-CM | POA: Insufficient documentation

## 2012-09-23 DIAGNOSIS — S298XXA Other specified injuries of thorax, initial encounter: Secondary | ICD-10-CM | POA: Insufficient documentation

## 2012-09-23 DIAGNOSIS — I10 Essential (primary) hypertension: Secondary | ICD-10-CM | POA: Insufficient documentation

## 2012-09-23 DIAGNOSIS — Z794 Long term (current) use of insulin: Secondary | ICD-10-CM | POA: Insufficient documentation

## 2012-09-23 DIAGNOSIS — G4733 Obstructive sleep apnea (adult) (pediatric): Secondary | ICD-10-CM | POA: Insufficient documentation

## 2012-09-23 DIAGNOSIS — Z79899 Other long term (current) drug therapy: Secondary | ICD-10-CM | POA: Insufficient documentation

## 2012-09-23 DIAGNOSIS — G8929 Other chronic pain: Secondary | ICD-10-CM | POA: Insufficient documentation

## 2012-09-23 DIAGNOSIS — M549 Dorsalgia, unspecified: Secondary | ICD-10-CM | POA: Insufficient documentation

## 2012-09-23 DIAGNOSIS — R062 Wheezing: Secondary | ICD-10-CM | POA: Insufficient documentation

## 2012-09-23 DIAGNOSIS — W208XXA Other cause of strike by thrown, projected or falling object, initial encounter: Secondary | ICD-10-CM | POA: Insufficient documentation

## 2012-09-23 DIAGNOSIS — Y929 Unspecified place or not applicable: Secondary | ICD-10-CM | POA: Insufficient documentation

## 2012-09-23 DIAGNOSIS — S20212A Contusion of left front wall of thorax, initial encounter: Secondary | ICD-10-CM

## 2012-09-23 DIAGNOSIS — J45909 Unspecified asthma, uncomplicated: Secondary | ICD-10-CM | POA: Insufficient documentation

## 2012-09-23 DIAGNOSIS — J40 Bronchitis, not specified as acute or chronic: Secondary | ICD-10-CM | POA: Insufficient documentation

## 2012-09-23 DIAGNOSIS — Y93B3 Activity, free weights: Secondary | ICD-10-CM | POA: Insufficient documentation

## 2012-09-23 DIAGNOSIS — Z87891 Personal history of nicotine dependence: Secondary | ICD-10-CM | POA: Insufficient documentation

## 2012-09-23 MED ORDER — OXYCODONE-ACETAMINOPHEN 5-325 MG PO TABS
1.0000 | ORAL_TABLET | Freq: Once | ORAL | Status: AC
Start: 2012-09-24 — End: 2012-09-24
  Administered 2012-09-24: 1 via ORAL
  Filled 2012-09-23: qty 1

## 2012-09-23 MED ORDER — METHOCARBAMOL 500 MG PO TABS
1000.0000 mg | ORAL_TABLET | Freq: Once | ORAL | Status: AC
  Administered 2012-09-24: 1000 mg via ORAL
  Filled 2012-09-23: qty 2

## 2012-09-23 NOTE — ED Notes (Signed)
Lifting weights, 35lb weight fell on left chest. C/o pain to same now

## 2012-09-24 MED ORDER — METHOCARBAMOL 500 MG PO TABS: 500.0000 mg | ORAL_TABLET | Freq: Three times a day (TID) | ORAL | Status: DC

## 2012-09-24 MED ORDER — OXYCODONE-ACETAMINOPHEN 5-325 MG PO TABS: 1.0000 | ORAL_TABLET | Freq: Four times a day (QID) | ORAL | Status: DC | PRN

## 2012-09-24 NOTE — ED Provider Notes (Signed)
History     CSN: [id number]  Arrival date & time 09/23/12  2232   First MD Initiated Contact with Patient 09/23/12 2326      Chief Complaint  Patient presents with  . Chest Injury    (Consider location/radiation/quality/duration/timing/severity/associated sxs/prior treatment) HPI Comments: Patient states he was working out with a 30 pound weights (2). He lost control and hit himself in the left chest with the 60-75 pounds weight. The patient states that he has been able to breathe okay, but has pain when he takes a deep breath. He denies any hemoptysis. He denies any stridor. There was no other injury reported. The patient tried Norco 10 mg but states this did not help at all. He tried sitting down and resting but this did not help either. The patient became concerned about his continued pain and came to the emergency department for additional evaluation.  The history is provided by the patient.    Past Medical History  Diagnosis Date  . Hypertension   . Diabetes mellitus   . Abscess   . Chronic back pain   . Sciatica   . Asthma   . Bronchitis   . OSA (obstructive sleep apnea)   . Sleep apnea     uses cpap    Past Surgical History  Procedure Laterality Date  . No past surgeries      Family History  Problem Relation Age of Onset  . Hypertension Mother   . Diabetes Mother   . Hypertension Father   . Diabetes Father   . Diabetes Brother   . Hypertension Brother   . Colon cancer Sister     History  Substance Use Topics  . Smoking status: Former Smoker -- 0.50 packs/day for 24 years    Types: Cigarettes    Start date: 06/20/1987    Quit date: 12/18/2011  . Smokeless tobacco: Never Used  . Alcohol Use: No      Review of Systems  Constitutional: Negative for activity change.       All ROS Neg except as noted in HPI  HENT: Negative for nosebleeds and neck pain.   Eyes: Negative for photophobia and discharge.  Respiratory: Positive for wheezing. Negative for  cough and shortness of breath.   Cardiovascular: Negative for chest pain and palpitations.  Gastrointestinal: Negative for abdominal pain and blood in stool.  Genitourinary: Negative for dysuria, frequency and hematuria.  Musculoskeletal: Positive for back pain and arthralgias.  Skin: Negative.   Neurological: Negative for dizziness, seizures and speech difficulty.  Psychiatric/Behavioral: Negative for hallucinations and confusion.    Allergies  Sulfa antibiotics  Home Medications   Current Outpatient Rx  Name  Route  Sig  Dispense  Refill  . albuterol-ipratropium (COMBIVENT) 18-103 MCG/ACT inhaler   Inhalation   Inhale 2 puffs into the lungs every 6 (six) hours as needed. For shortness of breath         . amLODipine (NORVASC) 10 MG tablet   Oral   Take 10 mg by mouth daily.           . budesonide-formoterol (SYMBICORT) 160-4.5 MCG/ACT inhaler      Take 2 puffs first thing in am and then another 2 puffs about 12 hours later.   1 Inhaler   12   . Choline Fenofibrate (TRILIPIX) 135 MG capsule   Oral   Take 135 mg by mouth daily.         . cyanocobalamin (,VITAMIN B-12,) 1000 MCG/ML injection  Intramuscular   Inject 1,000 mcg into the muscle every 30 (thirty) days.         . cyclobenzaprine (FLEXERIL) 10 MG tablet   Oral   Take 1 tablet (10 mg total) by mouth 3 (three) times daily as needed for muscle spasms.   20 tablet   0   . exenatide (BYETTA) 5 MCG/0.02ML SOLN   Subcutaneous   Inject 5 mcg into the skin every morning.         Marland Kitchen glipiZIDE (GLUCOTROL) 10 MG tablet   Oral   Take 10 mg by mouth 2 (two) times daily before a meal.           . hydrochlorothiazide 25 MG tablet   Oral   Take 25 mg by mouth daily.          . insulin glargine (LANTUS SOLOSTAR) 100 UNIT/ML injection   Subcutaneous   Inject 60 Units into the skin at bedtime.          . insulin NPH-insulin regular (HUMULIN 70/30 PEN) (70-30) 100 UNIT/ML injection   Subcutaneous    Inject 10-18 Units into the skin 3 (three) times daily. Sliding scale         . metFORMIN (GLUCOPHAGE) 1000 MG tablet   Oral   Take 1,000 mg by mouth 2 (two) times daily with a meal.           . omeprazole (PRILOSEC) 20 MG capsule   Oral   Take 1 capsule (20 mg total) by mouth daily.   30 capsule   11   . oxyCODONE-acetaminophen (PERCOCET/ROXICET) 5-325 MG per tablet   Oral   Take 1-2 tablets by mouth every 4 (four) hours as needed for pain.   24 tablet   0   . pravastatin (PRAVACHOL) 20 MG tablet   Oral   Take 20 mg by mouth daily.         . sitaGLIPtin (JANUVIA) 25 MG tablet   Oral   Take 25 mg by mouth daily.             BP 122/65  Pulse 97  Temp(Src) 97.9 F (36.6 C) (Oral)  Resp 18  Ht 6\' 1"  (1.854 m)  Wt 370 lb (167.831 kg)  BMI 48.83 kg/m2  SpO2 92%  Physical Exam  Nursing note and vitals reviewed. Constitutional: He is oriented to person, place, and time. He appears well-developed and well-nourished.  Non-toxic appearance.  HENT:  Head: Normocephalic.  Right Ear: Tympanic membrane and external ear normal.  Left Ear: Tympanic membrane and external ear normal.  Eyes: EOM and lids are normal. Pupils are equal, round, and reactive to light.  Neck: Normal range of motion. Neck supple. Carotid bruit is not present.  Trachea is in the midline. There is no stridor on auscultation.  Cardiovascular: Normal rate, regular rhythm, normal heart sounds, intact distal pulses and normal pulses.   Pulmonary/Chest: Breath sounds normal. No respiratory distress.  There is pain to palpation of the left upper chest wall. There is no bruising appreciated. There is no crepitus noted. There is symmetrical rise and fall of the chest wall. Patient speaks in complete sentences without problem.  Abdominal: Soft. Bowel sounds are normal. There is no tenderness. There is no guarding.  Musculoskeletal: Normal range of motion.  Lymphadenopathy:       Head (right side): No  submandibular adenopathy present.       Head (left side): No submandibular adenopathy present.    He  has no cervical adenopathy.  Neurological: He is alert and oriented to person, place, and time. He has normal strength. No cranial nerve deficit or sensory deficit.  Skin: Skin is warm and dry.  Psychiatric: He has a normal mood and affect. His speech is normal.    ED Course  Procedures (including critical care time) Pulse oximetry 92% on room air. Within normal limits by my interpretation. Labs Reviewed - No data to display Dg Chest 2 View  09/23/2012  *RADIOLOGY REPORT*  Clinical Data: Left-sided chest pain and left upper back pain.  CHEST - 2 VIEW  Comparison: Two-view chest 08/08/2012.  Findings: Heart size is normal.  Mild interstitial coarsening is chronic and stable.  No significant focal airspace disease is evident.  The visualized soft tissues and bony thorax are unremarkable.  IMPRESSION: No acute cardiopulmonary disease or significant interval change.   Original Report Authenticated By: Marin Roberts, M.D.      No diagnosis found.    MDM  I have reviewed nursing notes, vital signs, and all appropriate lab and imaging results for this patient. Patient was working out with weights when he lost control and hit himself in the chest with about 60-75 pounds of weight. He presents to the emergency department because he could not improve the pain with pain medication, rest, or other conservative measures.  The chest x-ray shows no cardiopulmonary disease or problems and no bony abnormalities.  Plan at this time is for the patient to be placed on Robaxin and Percocet for his discomfort. Patient is to see his primary physician for followup and recheck.       Kathie Dike, PA-C 09/24/12 [phone]

## 2012-09-24 NOTE — ED Provider Notes (Signed)
Medical screening examination/treatment/procedure(s) were performed by non-physician practitioner and as supervising physician I was immediately available for consultation/collaboration.  Nicoletta Dress. Colon Branch, MD 09/24/12 [phone]

## 2012-10-13 ENCOUNTER — Encounter (HOSPITAL_COMMUNITY): Payer: Self-pay | Admitting: *Deleted

## 2012-10-13 ENCOUNTER — Encounter: Payer: Self-pay | Admitting: Pulmonary Disease

## 2012-10-13 ENCOUNTER — Emergency Department (HOSPITAL_COMMUNITY)
Admission: EM | Admit: 2012-10-13 | Discharge: 2012-10-13 | Disposition: A | Discharge status: Home / Self Care | Payer: Medicaid Other | Attending: Emergency Medicine | Admitting: Emergency Medicine

## 2012-10-13 DIAGNOSIS — G4730 Sleep apnea, unspecified: Secondary | ICD-10-CM | POA: Insufficient documentation

## 2012-10-13 DIAGNOSIS — S30861A Insect bite (nonvenomous) of abdominal wall, initial encounter: Secondary | ICD-10-CM

## 2012-10-13 DIAGNOSIS — Y9389 Activity, other specified: Secondary | ICD-10-CM | POA: Insufficient documentation

## 2012-10-13 DIAGNOSIS — G4733 Obstructive sleep apnea (adult) (pediatric): Secondary | ICD-10-CM | POA: Insufficient documentation

## 2012-10-13 DIAGNOSIS — I10 Essential (primary) hypertension: Secondary | ICD-10-CM | POA: Insufficient documentation

## 2012-10-13 DIAGNOSIS — Y9289 Other specified places as the place of occurrence of the external cause: Secondary | ICD-10-CM | POA: Insufficient documentation

## 2012-10-13 DIAGNOSIS — Z8739 Personal history of other diseases of the musculoskeletal system and connective tissue: Secondary | ICD-10-CM | POA: Insufficient documentation

## 2012-10-13 DIAGNOSIS — L02214 Cutaneous abscess of groin: Secondary | ICD-10-CM

## 2012-10-13 DIAGNOSIS — S2095XA Superficial foreign body of unspecified parts of thorax, initial encounter: Secondary | ICD-10-CM | POA: Insufficient documentation

## 2012-10-13 DIAGNOSIS — L03319 Cellulitis of trunk, unspecified: Secondary | ICD-10-CM | POA: Insufficient documentation

## 2012-10-13 DIAGNOSIS — Z794 Long term (current) use of insulin: Secondary | ICD-10-CM | POA: Insufficient documentation

## 2012-10-13 DIAGNOSIS — Z8709 Personal history of other diseases of the respiratory system: Secondary | ICD-10-CM | POA: Insufficient documentation

## 2012-10-13 DIAGNOSIS — Z79899 Other long term (current) drug therapy: Secondary | ICD-10-CM | POA: Insufficient documentation

## 2012-10-13 DIAGNOSIS — Z9981 Dependence on supplemental oxygen: Secondary | ICD-10-CM | POA: Insufficient documentation

## 2012-10-13 DIAGNOSIS — IMO0002 Reserved for concepts with insufficient information to code with codable children: Secondary | ICD-10-CM | POA: Insufficient documentation

## 2012-10-13 DIAGNOSIS — Z87891 Personal history of nicotine dependence: Secondary | ICD-10-CM | POA: Insufficient documentation

## 2012-10-13 DIAGNOSIS — E119 Type 2 diabetes mellitus without complications: Secondary | ICD-10-CM | POA: Insufficient documentation

## 2012-10-13 DIAGNOSIS — L02219 Cutaneous abscess of trunk, unspecified: Secondary | ICD-10-CM | POA: Insufficient documentation

## 2012-10-13 DIAGNOSIS — J45909 Unspecified asthma, uncomplicated: Secondary | ICD-10-CM | POA: Insufficient documentation

## 2012-10-13 MED ORDER — DOXYCYCLINE HYCLATE 100 MG PO CAPS: 100.0000 mg | ORAL_CAPSULE | Freq: Two times a day (BID) | ORAL | Status: DC

## 2012-10-13 MED ORDER — OXYCODONE-ACETAMINOPHEN 5-325 MG PO TABS
2.0000 | ORAL_TABLET | Freq: Once | ORAL | Status: AC
Start: 2012-10-13 — End: 2012-10-13
  Administered 2012-10-13: 2 via ORAL
  Filled 2012-10-13: qty 2

## 2012-10-13 MED ORDER — OXYCODONE-ACETAMINOPHEN 5-325 MG PO TABS: 1.0000 | ORAL_TABLET | ORAL | Status: DC | PRN

## 2012-10-13 MED ORDER — DOXYCYCLINE HYCLATE 100 MG PO TABS
100.0000 mg | ORAL_TABLET | Freq: Once | ORAL | Status: AC
  Administered 2012-10-13: 100 mg via ORAL
  Filled 2012-10-13: qty 1

## 2012-10-13 NOTE — ED Notes (Signed)
nad noted prior to dc. Dc instructions reviewed and 2 scripts given. amb out without difficulty.

## 2012-10-13 NOTE — ED Notes (Signed)
Pt c/o boil around the hairline of his private area. Pt also has a tick on his near his belly button.

## 2012-10-17 NOTE — ED Provider Notes (Signed)
History     CSN: [id number]  Arrival date & time 10/13/12  2137   First MD Initiated Contact with Patient 10/13/12 2148      Chief Complaint  Patient presents with  . Recurrent Skin Infections    (Consider location/radiation/quality/duration/timing/severity/associated sxs/prior treatment) HPI Comments: Shawn Newman is a 42 y.o. Male presenting with an abscess in his right lower abdomen/pelvis area which has been present for the past several days.  He has been applying warm compresses to the site and it has started to drain purulent drainage.  He denies fevers or chills, rash,  Or surrounding redness. Additionally there has been no nausea or abdominal pain.  He is a diabetic, his last cbg today was less than 200.  He does have a history of previous abscesses,  Most recently he had one on his scrotum that resolved spontaneously.  He has never had I and D of and abscess.  He also noticed a tick on his abdomen since arriving here.      The history is provided by the patient.    Past Medical History  Diagnosis Date  . Hypertension   . Diabetes mellitus   . Abscess   . Chronic back pain   . Sciatica   . Asthma   . Bronchitis   . OSA (obstructive sleep apnea)   . Sleep apnea     uses cpap    Past Surgical History  Procedure Laterality Date  . No past surgeries      Family History  Problem Relation Age of Onset  . Hypertension Mother   . Diabetes Mother   . Hypertension Father   . Diabetes Father   . Diabetes Brother   . Hypertension Brother   . Colon cancer Sister     History  Substance Use Topics  . Smoking status: Former Smoker -- 0.50 packs/day for 24 years    Types: Cigarettes    Start date: 06/20/1987    Quit date: 12/18/2011  . Smokeless tobacco: Never Used  . Alcohol Use: No      Review of Systems  Constitutional: Negative for fever and chills.  HENT: Negative for facial swelling.   Respiratory: Negative for shortness of breath and wheezing.    Skin: Positive for wound. Negative for color change.  Neurological: Negative for numbness.    Allergies  Sulfa antibiotics  Home Medications   Current Outpatient Rx  Name  Route  Sig  Dispense  Refill  . albuterol-ipratropium (COMBIVENT) 18-103 MCG/ACT inhaler   Inhalation   Inhale 2 puffs into the lungs every 6 (six) hours as needed. For shortness of breath         . amLODipine (NORVASC) 10 MG tablet   Oral   Take 10 mg by mouth daily.           . budesonide-formoterol (SYMBICORT) 160-4.5 MCG/ACT inhaler      Take 2 puffs first thing in am and then another 2 puffs about 12 hours later.   1 Inhaler   12   . Choline Fenofibrate (TRILIPIX) 135 MG capsule   Oral   Take 135 mg by mouth daily.         . cyanocobalamin (,VITAMIN B-12,) 1000 MCG/ML injection   Intramuscular   Inject 1,000 mcg into the muscle every 30 (thirty) days.         . cyclobenzaprine (FLEXERIL) 10 MG tablet   Oral   Take 1 tablet (10 mg total) by mouth 3 (three)  times daily as needed for muscle spasms.   20 tablet   0   . doxycycline (VIBRAMYCIN) 100 MG capsule   Oral   Take 1 capsule (100 mg total) by mouth 2 (two) times daily.   20 capsule   0   . exenatide (BYETTA) 5 MCG/0.02ML SOLN   Subcutaneous   Inject 5 mcg into the skin every morning.         Marland Kitchen glipiZIDE (GLUCOTROL) 10 MG tablet   Oral   Take 10 mg by mouth 2 (two) times daily before a meal.           . hydrochlorothiazide 25 MG tablet   Oral   Take 25 mg by mouth daily.          . insulin glargine (LANTUS SOLOSTAR) 100 UNIT/ML injection   Subcutaneous   Inject 60 Units into the skin at bedtime.          . insulin NPH-insulin regular (HUMULIN 70/30 PEN) (70-30) 100 UNIT/ML injection   Subcutaneous   Inject 10-18 Units into the skin 3 (three) times daily. Sliding scale         . metFORMIN (GLUCOPHAGE) 1000 MG tablet   Oral   Take 1,000 mg by mouth 2 (two) times daily with a meal.           .  methocarbamol (ROBAXIN) 500 MG tablet   Oral   Take 1 tablet (500 mg total) by mouth 3 (three) times daily.   15 tablet   0   . omeprazole (PRILOSEC) 20 MG capsule   Oral   Take 1 capsule (20 mg total) by mouth daily.   30 capsule   11   . oxyCODONE-acetaminophen (PERCOCET/ROXICET) 5-325 MG per tablet   Oral   Take 1-2 tablets by mouth every 4 (four) hours as needed for pain.   24 tablet   0   . oxyCODONE-acetaminophen (PERCOCET/ROXICET) 5-325 MG per tablet   Oral   Take 1 tablet by mouth every 6 (six) hours as needed for pain.   15 tablet   0   . oxyCODONE-acetaminophen (PERCOCET/ROXICET) 5-325 MG per tablet   Oral   Take 1 tablet by mouth every 4 (four) hours as needed for pain.   20 tablet   0   . pravastatin (PRAVACHOL) 20 MG tablet   Oral   Take 20 mg by mouth daily.         . sitaGLIPtin (JANUVIA) 25 MG tablet   Oral   Take 25 mg by mouth daily.             BP 127/60  Pulse 87  Temp(Src) 99.4 F (37.4 C) (Oral)  Resp 18  Ht 6\' 1"  (1.854 m)  Wt 375 lb (170.099 kg)  BMI 49.49 kg/m2  SpO2 95%  Physical Exam  Constitutional: He appears well-developed and well-nourished. No distress.  HENT:  Head: Normocephalic.  Neck: Neck supple.  Cardiovascular: Normal rate.   Pulmonary/Chest: Effort normal. He has no wheezes.  Musculoskeletal: Normal range of motion. He exhibits no edema.  Skin: No erythema.  1 cm indurated raised abscess right lower abdomen.  It is actively drainage spontaneously.  No surrounding erythema,  No fluctuance.  Flat embedded tick left periumbilical with no surrounding erythema.      ED Course  FOREIGN BODY REMOVAL Date/Time: 10/13/2012 10:08 PM Performed by: Burgess Amor Authorized by: Burgess Amor Risks and benefits: risks, benefits and alternatives were discussed Consent given by: patient  Body area: skin Anesthesia method: none. Localization method: visualized Removal mechanism: forceps Depth: subcutaneous Complexity:  simple 1 objects recovered. Objects recovered: tick Post-procedure assessment: foreign body removed   (including critical care time)     Labs Reviewed - No data to display No results found.   1. Abscess of groin, right   2. Tick bite of abdomen, initial encounter       MDM  Discussed I and D vs continued warm soaks and antibiotics.  Pt defers on I&d procedure, preferring to continue with warm soaks.  He was prescribed doxycycline, oxycodone, advised close watch on abscess site, return here or see his doctor for any worsening sx.  The patient appears reasonably screened and/or stabilized for discharge and I doubt any other medical condition or other [facility] requiring further screening, evaluation, or treatment in the ED at this time prior to discharge.        Burgess Amor, PA-C 10/17/12 2212

## 2012-10-21 ENCOUNTER — Telehealth: Payer: Self-pay | Admitting: Pulmonary Disease

## 2012-10-21 NOTE — Telephone Encounter (Signed)
[id number] x1 for corie

## 2012-10-22 NOTE — Telephone Encounter (Signed)
Spoke with Corie she states: Everything needed was sent for patients CPAP-- but has been misplaced Needing Rx and OV note resent  I have refaxed this to ([phone])  Also sent [employer] for signature-- i have placed this form w [place] for Dr. Shelle Iron

## 2012-10-22 NOTE — Telephone Encounter (Signed)
ATC Corie no answer [id number]

## 2012-10-22 NOTE — ED Provider Notes (Signed)
Medical screening examination/treatment/procedure(s) were performed by non-physician practitioner and as supervising physician I was immediately available for consultation/collaboration.    Christopher J. Pollina, MD 10/22/12 0659 

## 2012-10-22 NOTE — Telephone Encounter (Signed)
This has been placed in [facility] CMN folder.

## 2012-11-05 ENCOUNTER — Encounter (HOSPITAL_COMMUNITY): Payer: Self-pay | Admitting: Emergency Medicine

## 2012-11-05 ENCOUNTER — Emergency Department (HOSPITAL_COMMUNITY)
Admission: EM | Admit: 2012-11-05 | Discharge: 2012-11-05 | Disposition: A | Discharge status: Home / Self Care | Payer: Medicaid Other | Attending: Emergency Medicine | Admitting: Emergency Medicine

## 2012-11-05 DIAGNOSIS — J45909 Unspecified asthma, uncomplicated: Secondary | ICD-10-CM | POA: Insufficient documentation

## 2012-11-05 DIAGNOSIS — IMO0002 Reserved for concepts with insufficient information to code with codable children: Secondary | ICD-10-CM | POA: Insufficient documentation

## 2012-11-05 DIAGNOSIS — I10 Essential (primary) hypertension: Secondary | ICD-10-CM | POA: Insufficient documentation

## 2012-11-05 DIAGNOSIS — Z87891 Personal history of nicotine dependence: Secondary | ICD-10-CM | POA: Insufficient documentation

## 2012-11-05 DIAGNOSIS — E119 Type 2 diabetes mellitus without complications: Secondary | ICD-10-CM | POA: Insufficient documentation

## 2012-11-05 DIAGNOSIS — G8929 Other chronic pain: Secondary | ICD-10-CM | POA: Insufficient documentation

## 2012-11-05 DIAGNOSIS — Z79899 Other long term (current) drug therapy: Secondary | ICD-10-CM | POA: Insufficient documentation

## 2012-11-05 DIAGNOSIS — G4733 Obstructive sleep apnea (adult) (pediatric): Secondary | ICD-10-CM | POA: Insufficient documentation

## 2012-11-05 DIAGNOSIS — Z872 Personal history of diseases of the skin and subcutaneous tissue: Secondary | ICD-10-CM | POA: Insufficient documentation

## 2012-11-05 DIAGNOSIS — Z794 Long term (current) use of insulin: Secondary | ICD-10-CM | POA: Insufficient documentation

## 2012-11-05 DIAGNOSIS — M5432 Sciatica, left side: Secondary | ICD-10-CM

## 2012-11-05 DIAGNOSIS — R209 Unspecified disturbances of skin sensation: Secondary | ICD-10-CM | POA: Insufficient documentation

## 2012-11-05 DIAGNOSIS — M5430 Sciatica, unspecified side: Secondary | ICD-10-CM | POA: Insufficient documentation

## 2012-11-05 MED ORDER — KETOROLAC TROMETHAMINE 60 MG/2ML IM SOLN
60.0000 mg | Freq: Once | INTRAMUSCULAR | Status: AC
  Administered 2012-11-05: 60 mg via INTRAMUSCULAR
  Filled 2012-11-05: qty 2

## 2012-11-05 MED ORDER — CYCLOBENZAPRINE HCL 10 MG PO TABS
10.0000 mg | ORAL_TABLET | Freq: Two times a day (BID) | ORAL | Status: DC | PRN
Start: 2012-11-05 — End: 2012-12-19

## 2012-11-05 MED ORDER — TRAMADOL HCL 50 MG PO TABS: 50.0000 mg | ORAL_TABLET | Freq: Four times a day (QID) | ORAL | Status: DC | PRN

## 2012-11-05 MED ORDER — NAPROXEN 500 MG PO TABS: 500.0000 mg | ORAL_TABLET | Freq: Two times a day (BID) | ORAL | Status: DC

## 2012-11-05 NOTE — ED Provider Notes (Signed)
History     This chart was scribed for Gilda Crease, *, MD by Smitty Pluck, ED Scribe. The patient was seen in room [facility]10/[facility]10 and the patient's care was started at 9:45 AM.   CSN: [id number]  Arrival date & time 11/05/12  1308      Chief Complaint  Patient presents with  . Back Pain    The history is provided by the patient and medical records. No language interpreter was used.   HPI Comments: Shawn Newman is a 42 y.o. male who presents to the Emergency Department complaining of left lower back pain radiating to left thigh onset 2 days ago. He reports that he has intermittent numbness to left toes.  He reports that movement aggravates the pain. He reports hx of similar pain and being diagnosed with sciatica. Pt denies injury to back, fall, urinary/bowel incontinence, fever, chills, nausea, vomiting, diarrhea, weakness, cough, SOB and any other pain.   Past Medical History  Diagnosis Date  . Hypertension   . Diabetes mellitus   . Abscess   . Chronic back pain   . Sciatica   . Asthma   . Bronchitis   . OSA (obstructive sleep apnea)   . Sleep apnea     uses cpap    Past Surgical History  Procedure Laterality Date  . No past surgeries      Family History  Problem Relation Age of Onset  . Hypertension Mother   . Diabetes Mother   . Hypertension Father   . Diabetes Father   . Diabetes Brother   . Hypertension Brother   . Colon cancer Sister     History  Substance Use Topics  . Smoking status: Former Smoker -- 0.50 packs/day for 24 years    Types: Cigarettes    Start date: 06/20/1987    Quit date: 12/18/2011  . Smokeless tobacco: Never Used  . Alcohol Use: No      Review of Systems  Musculoskeletal: Positive for back pain.  All other systems reviewed and are negative.    Allergies  Sulfa antibiotics  Home Medications   Current Outpatient Rx  Name  Route  Sig  Dispense  Refill  . albuterol-ipratropium (COMBIVENT) 18-103 MCG/ACT  inhaler   Inhalation   Inhale 2 puffs into the lungs every 6 (six) hours as needed. For shortness of breath         . amLODipine (NORVASC) 10 MG tablet   Oral   Take 10 mg by mouth daily.           . budesonide-formoterol (SYMBICORT) 160-4.5 MCG/ACT inhaler      Take 2 puffs first thing in am and then another 2 puffs about 12 hours later.   1 Inhaler   12   . Choline Fenofibrate (TRILIPIX) 135 MG capsule   Oral   Take 135 mg by mouth daily.         . cyanocobalamin (,VITAMIN B-12,) 1000 MCG/ML injection   Intramuscular   Inject 1,000 mcg into the muscle every 30 (thirty) days.         . cyclobenzaprine (FLEXERIL) 10 MG tablet   Oral   Take 1 tablet (10 mg total) by mouth 3 (three) times daily as needed for muscle spasms.   20 tablet   0   . doxycycline (VIBRAMYCIN) 100 MG capsule   Oral   Take 1 capsule (100 mg total) by mouth 2 (two) times daily.   20 capsule   0   .  exenatide (BYETTA) 5 MCG/0.02ML SOLN   Subcutaneous   Inject 5 mcg into the skin every morning.         Marland Kitchen glipiZIDE (GLUCOTROL) 10 MG tablet   Oral   Take 10 mg by mouth 2 (two) times daily before a meal.           . hydrochlorothiazide 25 MG tablet   Oral   Take 25 mg by mouth daily.          . insulin glargine (LANTUS SOLOSTAR) 100 UNIT/ML injection   Subcutaneous   Inject 60 Units into the skin at bedtime.          . insulin NPH-insulin regular (HUMULIN 70/30 PEN) (70-30) 100 UNIT/ML injection   Subcutaneous   Inject 10-18 Units into the skin 3 (three) times daily. Sliding scale         . metFORMIN (GLUCOPHAGE) 1000 MG tablet   Oral   Take 1,000 mg by mouth 2 (two) times daily with a meal.           . methocarbamol (ROBAXIN) 500 MG tablet   Oral   Take 1 tablet (500 mg total) by mouth 3 (three) times daily.   15 tablet   0   . omeprazole (PRILOSEC) 20 MG capsule   Oral   Take 1 capsule (20 mg total) by mouth daily.   30 capsule   11   . oxyCODONE-acetaminophen  (PERCOCET/ROXICET) 5-325 MG per tablet   Oral   Take 1-2 tablets by mouth every 4 (four) hours as needed for pain.   24 tablet   0   . oxyCODONE-acetaminophen (PERCOCET/ROXICET) 5-325 MG per tablet   Oral   Take 1 tablet by mouth every 6 (six) hours as needed for pain.   15 tablet   0   . oxyCODONE-acetaminophen (PERCOCET/ROXICET) 5-325 MG per tablet   Oral   Take 1 tablet by mouth every 4 (four) hours as needed for pain.   20 tablet   0   . pravastatin (PRAVACHOL) 20 MG tablet   Oral   Take 20 mg by mouth daily.         . sitaGLIPtin (JANUVIA) 25 MG tablet   Oral   Take 25 mg by mouth daily.             BP 136/81  Pulse 91  Temp(Src) 98.5 F (36.9 C)  Resp 20  Ht 6\' 1"  (1.854 m)  Wt 375 lb (170.099 kg)  BMI 49.49 kg/m2  SpO2 92%  Physical Exam  Nursing note and vitals reviewed. Constitutional: He is oriented to person, place, and time. He appears well-developed and well-nourished. No distress.  HENT:  Head: Normocephalic and atraumatic.  Right Ear: Hearing normal.  Left Ear: Hearing normal.  Nose: Nose normal.  Mouth/Throat: Oropharynx is clear and moist and mucous membranes are normal.  Eyes: Conjunctivae and EOM are normal. Pupils are equal, round, and reactive to light.  Neck: Normal range of motion. Neck supple.  Cardiovascular: Regular rhythm, S1 normal and S2 normal.  Exam reveals no gallop and no friction rub.   No murmur heard. Pulmonary/Chest: Effort normal and breath sounds normal. No respiratory distress. He exhibits no tenderness.  Abdominal: Soft. Normal appearance and bowel sounds are normal. There is no hepatosplenomegaly. There is no tenderness. There is no rebound, no guarding, no tenderness at McBurney's point and negative Murphy's sign. No hernia.  Musculoskeletal: Normal range of motion. He exhibits no edema.  Tenderness to  left para-lumbar area     Neurological: He is alert and oriented to person, place, and time. He has normal  strength. No cranial nerve deficit or sensory deficit. Coordination normal. GCS eye subscore is 4. GCS verbal subscore is 5. GCS motor subscore is 6.  Skin: Skin is warm, dry and intact. No rash noted. No cyanosis.  Psychiatric: He has a normal mood and affect. His speech is normal and behavior is normal. Thought content normal.    ED Course  Procedures (including critical care time) DIAGNOSTIC STUDIES: Oxygen Saturation is 92% on room air, low by my interpretation.    COORDINATION OF CARE: 9:49 AM Discussed ED treatment with pt and pt agrees.     Labs Reviewed - No data to display No results found.   Diagnosis: Sciatica    MDM  Patient presents to the ER with complaints of pain in the left lower back radiating down the left leg. He has a history of sciatica, usually on the right side. Symptoms are consistent with recurrent sciatica. He does not have any neurologic deficit. No change in bowel or bladder function. Patient does not require any imaging or studies today.  Reviewing recent records, patient has had 3 separate prescriptions for Percocet given in this department in the last 2 months. Did not order any further narcotic analgesia.    I personally performed the services described in this documentation, which was scribed in my presence. The recorded information has been reviewed and is accurate.    Gilda Crease, MD 11/05/12 [phone]

## 2012-11-05 NOTE — ED Notes (Signed)
Pt c/o left sided lower back pain radiating down left leg.

## 2012-12-17 ENCOUNTER — Ambulatory Visit: Payer: Medicaid Other | Admitting: Pulmonary Disease

## 2012-12-19 ENCOUNTER — Encounter (HOSPITAL_COMMUNITY): Payer: Self-pay | Admitting: Emergency Medicine

## 2012-12-19 ENCOUNTER — Emergency Department (HOSPITAL_COMMUNITY)
Admission: EM | Admit: 2012-12-19 | Discharge: 2012-12-19 | Disposition: A | Discharge status: Home / Self Care | Payer: Medicaid Other | Attending: Emergency Medicine | Admitting: Emergency Medicine

## 2012-12-19 DIAGNOSIS — M549 Dorsalgia, unspecified: Secondary | ICD-10-CM | POA: Insufficient documentation

## 2012-12-19 DIAGNOSIS — Z872 Personal history of diseases of the skin and subcutaneous tissue: Secondary | ICD-10-CM | POA: Insufficient documentation

## 2012-12-19 DIAGNOSIS — J029 Acute pharyngitis, unspecified: Secondary | ICD-10-CM | POA: Insufficient documentation

## 2012-12-19 DIAGNOSIS — N50819 Testicular pain, unspecified: Secondary | ICD-10-CM

## 2012-12-19 DIAGNOSIS — N508 Other specified disorders of male genital organs: Secondary | ICD-10-CM | POA: Insufficient documentation

## 2012-12-19 DIAGNOSIS — M5432 Sciatica, left side: Secondary | ICD-10-CM

## 2012-12-19 DIAGNOSIS — Z794 Long term (current) use of insulin: Secondary | ICD-10-CM | POA: Insufficient documentation

## 2012-12-19 DIAGNOSIS — Z87891 Personal history of nicotine dependence: Secondary | ICD-10-CM | POA: Insufficient documentation

## 2012-12-19 DIAGNOSIS — G4730 Sleep apnea, unspecified: Secondary | ICD-10-CM | POA: Insufficient documentation

## 2012-12-19 DIAGNOSIS — Z79899 Other long term (current) drug therapy: Secondary | ICD-10-CM | POA: Insufficient documentation

## 2012-12-19 DIAGNOSIS — B370 Candidal stomatitis: Secondary | ICD-10-CM | POA: Insufficient documentation

## 2012-12-19 DIAGNOSIS — E119 Type 2 diabetes mellitus without complications: Secondary | ICD-10-CM | POA: Insufficient documentation

## 2012-12-19 DIAGNOSIS — Z8669 Personal history of other diseases of the nervous system and sense organs: Secondary | ICD-10-CM | POA: Insufficient documentation

## 2012-12-19 DIAGNOSIS — J45909 Unspecified asthma, uncomplicated: Secondary | ICD-10-CM | POA: Insufficient documentation

## 2012-12-19 DIAGNOSIS — I10 Essential (primary) hypertension: Secondary | ICD-10-CM | POA: Insufficient documentation

## 2012-12-19 LAB — URINE MICROSCOPIC-ADD ON

## 2012-12-19 LAB — URINALYSIS, ROUTINE W REFLEX MICROSCOPIC
Bilirubin Urine: NEGATIVE
Glucose, UA: >1000 mg/dL — AB
Hgb urine dipstick: NEGATIVE
Ketones, ur: NEGATIVE mg/dL
Protein, ur: NEGATIVE mg/dL

## 2012-12-19 LAB — MONONUCLEOSIS SCREEN: Mono Screen: NEGATIVE

## 2012-12-19 MED ORDER — OXYCODONE-ACETAMINOPHEN 5-325 MG PO TABS: 1.0000 | ORAL_TABLET | Freq: Four times a day (QID) | ORAL | Status: DC | PRN

## 2012-12-19 MED ORDER — DOXYCYCLINE HYCLATE 100 MG PO CAPS: 100.0000 mg | ORAL_CAPSULE | Freq: Two times a day (BID) | ORAL | Status: AC

## 2012-12-19 MED ORDER — OXYCODONE-ACETAMINOPHEN 5-325 MG PO TABS
1.0000 | ORAL_TABLET | Freq: Once | ORAL | Status: AC
  Administered 2012-12-19: 1 via ORAL
  Filled 2012-12-19: qty 1

## 2012-12-19 MED ORDER — DOXYCYCLINE HYCLATE 100 MG PO TABS
100.0000 mg | ORAL_TABLET | Freq: Once | ORAL | Status: AC
  Administered 2012-12-19: 100 mg via ORAL
  Filled 2012-12-19: qty 1

## 2012-12-19 NOTE — ED Notes (Signed)
Patient complaining of sore throat and pain when swallowing x 2 days. Also complains of white patches coating tongue and mouth and back pain with chronic history of same.

## 2012-12-19 NOTE — ED Provider Notes (Signed)
History    CSN: [id number] Arrival date & time 12/19/12  1926  First MD Initiated Contact with Patient 12/19/12 2029     Chief Complaint  Patient presents with  . Sore Throat  . Thrush  . Back Pain   (Consider location/radiation/quality/duration/timing/severity/associated sxs/prior Treatment) HPI Comments: Patient is a 42 year old male who has a history of diabetes mellitus, insulin requiring; hypertension; sciatica; asthma; sleep apnea; morbid obesity. The patient presents to the emergency department with one sore throat, 2 sciatic area back pain, 3 pain of both testicles.  The patient gives a two-day history of sore throat and pain with swallowing. He states he has noted some white patches on his tongue has not necessarily seen any in the back of his throat. He has not had any high fever that he has measured. Patient states he also feels like his glands are swollen in his neck.  The patient states he has a long-term history of sciatica and chronic back pain. He has had injections of steroid in his back with only very short course of improvement. The patient states that he is being evaluated for bariatric surgery. His doctor is in hopes that this will help alleviate some of the pain and discomfort.  The patient gives also a one-day history of pain of both testicles. He states he has not necessarily noted swelling. He has not had any injury to the testicles. He has not noted any abscess in the paramedial area. He does not have any difficulty with urination. He has not had any rectal pain with passing his stool. He's not had any penile discharge.  Patient is a 42 y.o. male presenting with pharyngitis and back pain. The history is provided by the patient and the spouse.  Sore Throat Associated symptoms include a sore throat. Pertinent negatives include no abdominal pain, arthralgias, chest pain, coughing, fever or neck pain.  Back Pain Associated symptoms: no abdominal pain, no chest pain,  no dysuria and no fever    Past Medical History  Diagnosis Date  . Hypertension   . Diabetes mellitus   . Abscess   . Chronic back pain   . Sciatica   . Asthma   . Bronchitis   . OSA (obstructive sleep apnea)   . Sleep apnea     uses cpap   Past Surgical History  Procedure Laterality Date  . No past surgeries     Family History  Problem Relation Age of Onset  . Hypertension Mother   . Diabetes Mother   . Hypertension Father   . Diabetes Father   . Diabetes Brother   . Hypertension Brother   . Colon cancer Sister    History  Substance Use Topics  . Smoking status: Former Smoker -- 0.50 packs/day for 24 years    Types: Cigarettes    Start date: 06/20/1987    Quit date: 12/18/2011  . Smokeless tobacco: Never Used  . Alcohol Use: No    Review of Systems  Constitutional: Negative for fever and activity change.       All ROS Neg except as noted in HPI  HENT: Positive for sore throat. Negative for nosebleeds and neck pain.   Eyes: Negative for photophobia and discharge.  Respiratory: Negative for cough, shortness of breath and wheezing.   Cardiovascular: Negative for chest pain and palpitations.  Gastrointestinal: Negative for abdominal pain and blood in stool.  Genitourinary: Positive for testicular pain. Negative for dysuria, frequency, hematuria, discharge and difficulty urinating.  Musculoskeletal:  Positive for back pain. Negative for arthralgias.  Skin: Negative.   Neurological: Negative for dizziness, seizures and speech difficulty.  Psychiatric/Behavioral: Negative for hallucinations and confusion.    Allergies  Sulfa antibiotics  Home Medications   Current Outpatient Rx  Name  Route  Sig  Dispense  Refill  . amLODipine (NORVASC) 10 MG tablet   Oral   Take 10 mg by mouth daily.           . Choline Fenofibrate (TRILIPIX) 135 MG capsule   Oral   Take 135 mg by mouth daily.         Marland Kitchen exenatide (BYETTA) 5 MCG/0.02ML SOLN   Subcutaneous   Inject 5  mcg into the skin every morning.         Marland Kitchen glipiZIDE (GLUCOTROL) 10 MG tablet   Oral   Take 10 mg by mouth 2 (two) times daily before a meal.           . hydrochlorothiazide 25 MG tablet   Oral   Take 25 mg by mouth daily.          . insulin glargine (LANTUS SOLOSTAR) 100 UNIT/ML injection   Subcutaneous   Inject 60 Units into the skin at bedtime.          . insulin NPH-insulin regular (HUMULIN 70/30 PEN) (70-30) 100 UNIT/ML injection   Subcutaneous   Inject 10-18 Units into the skin 3 (three) times daily. Sliding scale         . metFORMIN (GLUCOPHAGE) 1000 MG tablet   Oral   Take 1,000 mg by mouth 2 (two) times daily with a meal.           . omeprazole (PRILOSEC) 20 MG capsule   Oral   Take 1 capsule (20 mg total) by mouth daily.   30 capsule   11   . pravastatin (PRAVACHOL) 20 MG tablet   Oral   Take 20 mg by mouth daily.         . sitaGLIPtin (JANUVIA) 25 MG tablet   Oral   Take 25 mg by mouth daily.           Marland Kitchen albuterol-ipratropium (COMBIVENT) 18-103 MCG/ACT inhaler   Inhalation   Inhale 2 puffs into the lungs every 6 (six) hours as needed. For shortness of breath         . budesonide-formoterol (SYMBICORT) 160-4.5 MCG/ACT inhaler      Take 2 puffs first thing in am and then another 2 puffs about 12 hours later.   1 Inhaler   12   . cyanocobalamin (,VITAMIN B-12,) 1000 MCG/ML injection   Intramuscular   Inject 1,000 mcg into the muscle every 30 (thirty) days.          BP 134/76  Pulse 100  Temp(Src) 98.3 F (36.8 C) (Oral)  Resp 22  Ht 6\' 1"  (1.854 m)  Wt 375 lb (170.099 kg)  BMI 49.49 kg/m2  SpO2 93% Physical Exam  Nursing note and vitals reviewed. Constitutional: He is oriented to person, place, and time. He appears well-developed and well-nourished.  Non-toxic appearance.  Morbid obesity note.  HENT:  Head: Normocephalic.  Right Ear: Tympanic membrane and external ear normal.  Left Ear: Tympanic membrane and external ear  normal.  There is increased redness of the posterior pharynx and uvula. The uvula is mild to moderately enlarged. The airway is patent. Speech is clear and understandable. There no lesions under the tongue, no swelling under the tongue. There  is increased redness of the tongue with certain areas of white coating present.  Eyes: EOM and lids are normal. Pupils are equal, round, and reactive to light.  Neck: Normal range of motion. Neck supple. Carotid bruit is not present.  Cardiovascular: Normal rate, regular rhythm, normal heart sounds, intact distal pulses and normal pulses.   Pulmonary/Chest: Breath sounds normal. No respiratory distress.  Abdominal: Soft. Bowel sounds are normal. There is no tenderness. There is no guarding.  Genitourinary:  The patient is uncircumcised. There no lesions noted of the shaft of the penis. There is no penile discharge noted. There is tenderness of the right and left testicles. Epididymal area. Raising the testicles does not change the pain at all. Is no lesion in the perineal area.  Musculoskeletal: Normal range of motion.  Lymphadenopathy:       Head (right side): No submandibular adenopathy present.       Head (left side): No submandibular adenopathy present.    He has cervical adenopathy.  Neurological: He is alert and oriented to person, place, and time. He has normal strength. No cranial nerve deficit or sensory deficit.  Skin: Skin is warm and dry.  Psychiatric: He has a normal mood and affect. His speech is normal.    ED Course  Procedures (including critical care time) Labs Reviewed  RAPID STREP SCREEN  URINALYSIS, ROUTINE W REFLEX MICROSCOPIC  MONONUCLEOSIS SCREEN   No results found. No diagnosis found.  MDM  I have reviewed nursing notes, vital signs, and all appropriate lab and imaging results for this patient. The strep test is negative. UA is negative, except for elevated glucose. Monospot is negative. Pt advised to use salt water gargles  and cloraseptic for sore throat. He is given Rx for percocet for back pain and testicular pain. Pt has bilateral pain that can be reproduced with palpation of the testicles. No noted swelling. No urine changes. Pt eating and conversing with his significant other with out problem. Ambulatory without problem. Doubt testicular tortion.  Pt placed on doxycycline for orchidalgia. Pt to follow up with PCP next week for recheck. He will return to the ED if any changes or problem.  Kathie Dike, PA-C 12/19/12 2333

## 2012-12-20 NOTE — ED Provider Notes (Signed)
Medical screening examination/treatment/procedure(s) were performed by non-physician practitioner and as supervising physician I was immediately available for consultation/collaboration.  Hurman Horn, MD 12/20/12 [phone]

## 2012-12-22 LAB — CULTURE, GROUP A STREP

## 2013-01-06 ENCOUNTER — Ambulatory Visit: Payer: Medicaid Other | Admitting: Pulmonary Disease

## 2013-01-14 ENCOUNTER — Emergency Department (HOSPITAL_COMMUNITY): Payer: Medicaid Other

## 2013-01-14 ENCOUNTER — Encounter (HOSPITAL_COMMUNITY): Payer: Self-pay | Admitting: *Deleted

## 2013-01-14 ENCOUNTER — Emergency Department (HOSPITAL_COMMUNITY)
Admission: EM | Admit: 2013-01-14 | Discharge: 2013-01-14 | Disposition: A | Discharge status: Home / Self Care | Payer: Medicaid Other | Attending: Emergency Medicine | Admitting: Emergency Medicine

## 2013-01-14 DIAGNOSIS — Z872 Personal history of diseases of the skin and subcutaneous tissue: Secondary | ICD-10-CM | POA: Insufficient documentation

## 2013-01-14 DIAGNOSIS — Z87891 Personal history of nicotine dependence: Secondary | ICD-10-CM | POA: Insufficient documentation

## 2013-01-14 DIAGNOSIS — G8929 Other chronic pain: Secondary | ICD-10-CM | POA: Insufficient documentation

## 2013-01-14 DIAGNOSIS — Z8739 Personal history of other diseases of the musculoskeletal system and connective tissue: Secondary | ICD-10-CM | POA: Insufficient documentation

## 2013-01-14 DIAGNOSIS — M546 Pain in thoracic spine: Secondary | ICD-10-CM | POA: Insufficient documentation

## 2013-01-14 DIAGNOSIS — Z79899 Other long term (current) drug therapy: Secondary | ICD-10-CM | POA: Insufficient documentation

## 2013-01-14 DIAGNOSIS — G4733 Obstructive sleep apnea (adult) (pediatric): Secondary | ICD-10-CM | POA: Insufficient documentation

## 2013-01-14 DIAGNOSIS — I10 Essential (primary) hypertension: Secondary | ICD-10-CM | POA: Insufficient documentation

## 2013-01-14 DIAGNOSIS — E119 Type 2 diabetes mellitus without complications: Secondary | ICD-10-CM | POA: Insufficient documentation

## 2013-01-14 DIAGNOSIS — Z8709 Personal history of other diseases of the respiratory system: Secondary | ICD-10-CM | POA: Insufficient documentation

## 2013-01-14 DIAGNOSIS — IMO0002 Reserved for concepts with insufficient information to code with codable children: Secondary | ICD-10-CM | POA: Insufficient documentation

## 2013-01-14 DIAGNOSIS — IMO0001 Reserved for inherently not codable concepts without codable children: Secondary | ICD-10-CM | POA: Insufficient documentation

## 2013-01-14 DIAGNOSIS — Z794 Long term (current) use of insulin: Secondary | ICD-10-CM | POA: Insufficient documentation

## 2013-01-14 DIAGNOSIS — K921 Melena: Secondary | ICD-10-CM | POA: Insufficient documentation

## 2013-01-14 DIAGNOSIS — M545 Low back pain, unspecified: Secondary | ICD-10-CM | POA: Insufficient documentation

## 2013-01-14 DIAGNOSIS — M6281 Muscle weakness (generalized): Secondary | ICD-10-CM | POA: Insufficient documentation

## 2013-01-14 DIAGNOSIS — R32 Unspecified urinary incontinence: Secondary | ICD-10-CM | POA: Insufficient documentation

## 2013-01-14 DIAGNOSIS — J45909 Unspecified asthma, uncomplicated: Secondary | ICD-10-CM | POA: Insufficient documentation

## 2013-01-14 DIAGNOSIS — K625 Hemorrhage of anus and rectum: Secondary | ICD-10-CM | POA: Insufficient documentation

## 2013-01-14 LAB — CBC WITH DIFFERENTIAL/PLATELET
Basophils Absolute: 0 10*3/uL (ref 0.0–0.1)
Eosinophils Relative: 2 % (ref 0–5)
Lymphocytes Relative: 46 % (ref 12–46)
MCV: 84.9 fL (ref 78.0–100.0)
Platelets: 245 10*3/uL (ref 150–400)
RDW: 13.2 % (ref 11.5–15.5)
WBC: 7.4 10*3/uL (ref 4.0–10.5)

## 2013-01-14 LAB — URINE MICROSCOPIC-ADD ON

## 2013-01-14 LAB — OCCULT BLOOD, POC DEVICE: Fecal Occult Bld: POSITIVE — AB

## 2013-01-14 LAB — COMPREHENSIVE METABOLIC PANEL
ALT: 34 U/L (ref 0–53)
AST: 30 U/L (ref 0–37)
Albumin: 3.8 g/dL (ref 3.5–5.2)
CO2: 26 mEq/L (ref 19–32)
Calcium: 10 mg/dL (ref 8.4–10.5)
GFR calc non Af Amer: >90 mL/min (ref >90)
Sodium: 134 mEq/L — ABNORMAL LOW (ref 135–145)
Total Protein: 8.4 g/dL — ABNORMAL HIGH (ref 6.0–8.3)

## 2013-01-14 LAB — URINALYSIS, ROUTINE W REFLEX MICROSCOPIC
Glucose, UA: >1000 mg/dL — AB
Hgb urine dipstick: NEGATIVE
Specific Gravity, Urine: 1.02 (ref 1.005–1.030)
pH: 6 (ref 5.0–8.0)

## 2013-01-14 LAB — D-DIMER, QUANTITATIVE: D-Dimer, Quant: 0.35 ug/mL-FEU (ref 0.00–0.48)

## 2013-01-14 MED ORDER — HYDROCODONE-ACETAMINOPHEN 5-325 MG PO TABS: 2.0000 | ORAL_TABLET | ORAL | Status: DC | PRN

## 2013-01-14 MED ORDER — HYDROCODONE-ACETAMINOPHEN 5-325 MG PO TABS
2.0000 | ORAL_TABLET | Freq: Once | ORAL | Status: AC
  Administered 2013-01-14: 2 via ORAL
  Filled 2013-01-14: qty 2

## 2013-01-14 MED ORDER — HYDROCODONE-ACETAMINOPHEN 5-325 MG PO TABS
ORAL_TABLET | ORAL | Status: AC
  Filled 2013-01-14: qty 1

## 2013-01-14 MED ORDER — ONDANSETRON HCL 4 MG PO TABS: 4.0000 mg | ORAL_TABLET | Freq: Four times a day (QID) | ORAL | Status: DC

## 2013-01-14 MED ORDER — SODIUM CHLORIDE 0.9 % IV BOLUS (SEPSIS)
1000.0000 mL | Freq: Once | INTRAVENOUS | Status: AC
  Administered 2013-01-14: 1000 mL via INTRAVENOUS

## 2013-01-14 MED ORDER — OMEPRAZOLE 20 MG PO CPDR: 20.0000 mg | DELAYED_RELEASE_CAPSULE | Freq: Every day | ORAL | Status: DC

## 2013-01-14 NOTE — ED Notes (Signed)
C/o back pain x 3-4 days.  States noticed large amt dark red blood after BM in toilet.  Unsure of how long has been having rectal bleeding.  Denies n/v/d.  C/o dizziness.

## 2013-01-14 NOTE — ED Provider Notes (Signed)
CSN: [id number]     Arrival date & time 01/14/13  1258 History  This chart was scribed for Glynn Octave, MD, by Yevette Edwards, ED Scribe. This patient was seen in room APA19/AP[facility]19 and the patient's care was started at 1:23 PM.   First MD Initiated Contact with Patient 01/14/13 1313     Chief Complaint  Patient presents with  . Rectal Bleeding  . Back Pain    The history is provided by the patient. No language interpreter was used.   HPI Comments: Shawn Newman is a 42 y.o. male, with a h/o DM, who presents to the Emergency Department complaining of constant thoracic back pain which has been occurring for 4 days. He reports the back pain is different from prior episodes of his chronic lumbar back pain. The pt states that the back pain is increased with a deep breath. He has also experienced urinary incontinence at night, weakness, several days of watery stools, and hematochezia which was dark red and which he first noticed today. He denies experiencing any abdominal pain, chest pain, testicular pain, numbness, tingling, or pain which radiates to his buttock.  The pt takes an aspirin a day. He denies a h/o diagnosed ulcers and hemorrhoids. He is a smoker, and he does not use alcohol.   Past Medical History  Diagnosis Date  . Hypertension   . Diabetes mellitus   . Abscess   . Chronic back pain   . Sciatica   . Asthma   . Bronchitis   . OSA (obstructive sleep apnea)   . Sleep apnea     uses cpap   Past Surgical History  Procedure Laterality Date  . No past surgeries     Family History  Problem Relation Age of Onset  . Hypertension Mother   . Diabetes Mother   . Hypertension Father   . Diabetes Father   . Diabetes Brother   . Hypertension Brother   . Colon cancer Sister    History  Substance Use Topics  . Smoking status: Former Smoker -- 0.50 packs/day for 24 years    Types: Cigarettes    Start date: 06/20/1987    Quit date: 12/18/2011  . Smokeless tobacco: Never  Used  . Alcohol Use: No    Review of Systems  A complete 10 system review of systems was obtained, and all systems were negative except where indicated in the HPI and PE.   Allergies  Sulfa antibiotics  Home Medications   Current Outpatient Rx  Name  Route  Sig  Dispense  Refill  . albuterol-ipratropium (COMBIVENT) 18-103 MCG/ACT inhaler   Inhalation   Inhale 2 puffs into the lungs every 6 (six) hours as needed. For shortness of breath         . amLODipine (NORVASC) 10 MG tablet   Oral   Take 10 mg by mouth daily.           . budesonide-formoterol (SYMBICORT) 160-4.5 MCG/ACT inhaler      Take 2 puffs first thing in am and then another 2 puffs about 12 hours later.   1 Inhaler   12   . Choline Fenofibrate (TRILIPIX) 135 MG capsule   Oral   Take 135 mg by mouth daily.         . cyanocobalamin (,VITAMIN B-12,) 1000 MCG/ML injection   Intramuscular   Inject 1,000 mcg into the muscle every 30 (thirty) days.         Marland Kitchen exenatide (BYETTA) 5 MCG/0.02ML  SOLN   Subcutaneous   Inject 5 mcg into the skin every morning.         . fenofibrate 160 MG tablet   Oral   Take 160 mg by mouth daily.         . Fluticasone-Salmeterol (ADVAIR) 250-50 MCG/DOSE AEPB   Inhalation   Inhale 1 puff into the lungs every 12 (twelve) hours.         Marland Kitchen glipiZIDE (GLUCOTROL) 10 MG tablet   Oral   Take 10 mg by mouth 2 (two) times daily before a meal.           . hydrochlorothiazide 25 MG tablet   Oral   Take 25 mg by mouth daily.          . insulin aspart (NOVOLOG) 100 UNIT/ML injection   Subcutaneous   Inject 15-18 Units into the skin 3 (three) times daily with meals.         . insulin glargine (LANTUS SOLOSTAR) 100 UNIT/ML injection   Subcutaneous   Inject 60 Units into the skin at bedtime.          . metFORMIN (GLUCOPHAGE) 1000 MG tablet   Oral   Take 1,000 mg by mouth 2 (two) times daily with a meal.           . omeprazole (PRILOSEC) 20 MG capsule   Oral    Take 1 capsule (20 mg total) by mouth daily.   30 capsule   11   . pravastatin (PRAVACHOL) 20 MG tablet   Oral   Take 20 mg by mouth daily.         . sitaGLIPtin (JANUVIA) 25 MG tablet   Oral   Take 25 mg by mouth daily.           Marland Kitchen HYDROcodone-acetaminophen (NORCO/VICODIN) 5-325 MG per tablet   Oral   Take 2 tablets by mouth every 4 (four) hours as needed for pain.   10 tablet   0   . omeprazole (PRILOSEC) 20 MG capsule   Oral   Take 1 capsule (20 mg total) by mouth daily.   30 capsule   0   . ondansetron (ZOFRAN) 4 MG tablet   Oral   Take 1 tablet (4 mg total) by mouth every 6 (six) hours.   12 tablet   0    Triage Vitals: BP 130/98  Pulse 93  Temp(Src) 97.7 F (36.5 C) (Oral)  Resp 18  Ht 6\' 1"  (1.854 m)  Wt 360 lb (163.295 kg)  BMI 47.51 kg/m2  SpO2 94%  Physical Exam  Nursing note and vitals reviewed. Constitutional: He is oriented to person, place, and time. He appears well-developed and well-nourished. No distress.  Morbidly obese.  HENT:  Head: Normocephalic and atraumatic.  Mouth/Throat: Oropharynx is clear and moist. No oropharyngeal exudate.  Eyes: Conjunctivae and EOM are normal. Pupils are equal, round, and reactive to light. Right eye exhibits no discharge. Left eye exhibits no discharge. No scleral icterus.  Neck: Normal range of motion. Neck supple. No JVD present. No thyromegaly present.  Cardiovascular: Normal rate, regular rhythm, normal heart sounds and intact distal pulses.  Exam reveals no gallop and no friction rub.   No murmur heard. Pulmonary/Chest: Effort normal and breath sounds normal. No respiratory distress. He has no wheezes. He has no rales.  Abdominal: Soft. Bowel sounds are normal. He exhibits no distension and no mass. There is no tenderness.  Genitourinary: Guaiac positive stool.  No external  hemorrhoids. There is brown, pinkish stool that is guaiac positive.   Musculoskeletal: Normal range of motion. He exhibits  tenderness. He exhibits no edema.  He has right thoracic perispinal tenderness. No midline tenderness.  5/5 strength in bilateral lower extremities. Ankle plantar and dorsiflexion intact. Great toe extension intact bilaterally. +2 DP and PT pulses. +2 patellar reflexes bilaterally. Normal gait.   Lymphadenopathy:    He has no cervical adenopathy.  Neurological: He is alert and oriented to person, place, and time. No cranial nerve deficit. He exhibits normal muscle tone. Coordination normal.  Skin: Skin is warm and dry. No rash noted. No erythema.  Psychiatric: He has a normal mood and affect. His behavior is normal.    ED Course   Medications  HYDROcodone-acetaminophen (NORCO/VICODIN) 5-325 MG per tablet 2 tablet (2 tablets Oral Given 01/14/13 1339)  sodium chloride 0.9 % bolus 1,000 mL (0 mLs Intravenous Stopped 01/14/13 1609)    DIAGNOSTIC STUDIES: Oxygen Saturation is 94% on room air, normal by my interpretation.    COORDINATION OF CARE:  1:30 PM- Discussed treatment plan with patient, and the patient agreed to the plan.   Procedures (including critical care time)  Labs Reviewed  COMPREHENSIVE METABOLIC PANEL - Abnormal; Notable for the following:    Sodium 134 (*)    Glucose, Bld 325 (*)    Total Protein 8.4 (*)    All other components within normal limits  URINALYSIS, ROUTINE W REFLEX MICROSCOPIC - Abnormal; Notable for the following:    Glucose, UA >1000 (*)    All other components within normal limits  OCCULT BLOOD, POC DEVICE - Abnormal; Notable for the following:    Fecal Occult Bld POSITIVE (*)    All other components within normal limits  CBC WITH DIFFERENTIAL  LIPASE, BLOOD  D-DIMER, QUANTITATIVE  URINE MICROSCOPIC-ADD ON   Dg Abd Acute W/chest  01/14/2013   *RADIOLOGY REPORT*  Clinical Data: Abdominal pain with rectal bleeding.  ACUTE ABDOMEN SERIES (ABDOMEN 2 VIEW & CHEST 1 VIEW)  Comparison: Chest radiographs 09/23/2012.  Abdominal pelvic CT 06/19/2012.   Findings: The heart size and mediastinal contours are normal. The lungs are clear. There is no pleural effusion or pneumothorax. No acute osseous findings are identified.  Mild asymmetry of the first costochondral junctions is stable.  Abdominal radiographs are limited by patient's size.  The bowel gas pattern is normal.  There is no free intraperitoneal air or suspicious calcification.  No osseous abnormalities are seen.  IMPRESSION: No evidence of active cardiopulmonary or abdominal process.   Original Report Authenticated By: Carey Bullocks, M.D.   1. Rectal bleeding     MDM  Episode of bright red blood per rectum this morning it was painless. No abdominal pain. No fever or vomiting. Reports negative colonoscopy 5 years ago. He is scheduled for another one next month. Left-sided thoracic back pain that is different than his chronic lumbar pain. No focal weakness, numbness or tingling. UA negative without infection or hematuria.  CXR and D-dimer negative. Suspect musculoskeletal back pain. No evidence of cord compression or cauda equina. Hemoglobin stable. Vitals stable. No evidence of active bleeding.  Patient appears stable to followup for his colonscopy as an outpatient as scheduled.  BP 110/65  Pulse 95  Temp(Src) 97.7 F (36.5 C) (Oral)  Resp 18  Ht 6\' 1"  (1.854 m)  Wt 360 lb (163.295 kg)  BMI 47.51 kg/m2  SpO2 92%  I personally performed the services described in this documentation, which was scribed  in my presence. The recorded information has been reviewed and is accurate.    Glynn Octave, MD 01/14/13 [phone]

## 2013-02-03 DIAGNOSIS — E119 Type 2 diabetes mellitus without complications: Secondary | ICD-10-CM | POA: Insufficient documentation

## 2013-02-03 DIAGNOSIS — I10 Essential (primary) hypertension: Secondary | ICD-10-CM | POA: Insufficient documentation

## 2013-02-03 DIAGNOSIS — M545 Low back pain, unspecified: Secondary | ICD-10-CM | POA: Insufficient documentation

## 2013-02-03 DIAGNOSIS — Z87891 Personal history of nicotine dependence: Secondary | ICD-10-CM | POA: Insufficient documentation

## 2013-02-03 DIAGNOSIS — G8929 Other chronic pain: Secondary | ICD-10-CM | POA: Insufficient documentation

## 2013-02-03 DIAGNOSIS — Z8739 Personal history of other diseases of the musculoskeletal system and connective tissue: Secondary | ICD-10-CM | POA: Insufficient documentation

## 2013-02-03 DIAGNOSIS — J45909 Unspecified asthma, uncomplicated: Secondary | ICD-10-CM | POA: Insufficient documentation

## 2013-02-03 DIAGNOSIS — Z872 Personal history of diseases of the skin and subcutaneous tissue: Secondary | ICD-10-CM | POA: Insufficient documentation

## 2013-02-03 DIAGNOSIS — Z794 Long term (current) use of insulin: Secondary | ICD-10-CM | POA: Insufficient documentation

## 2013-02-03 DIAGNOSIS — Z8709 Personal history of other diseases of the respiratory system: Secondary | ICD-10-CM | POA: Insufficient documentation

## 2013-02-03 DIAGNOSIS — G4733 Obstructive sleep apnea (adult) (pediatric): Secondary | ICD-10-CM | POA: Insufficient documentation

## 2013-02-03 DIAGNOSIS — Z79899 Other long term (current) drug therapy: Secondary | ICD-10-CM | POA: Insufficient documentation

## 2013-02-04 ENCOUNTER — Encounter (HOSPITAL_COMMUNITY): Payer: Self-pay | Admitting: *Deleted

## 2013-02-04 ENCOUNTER — Emergency Department (HOSPITAL_COMMUNITY)
Admission: EM | Admit: 2013-02-04 | Discharge: 2013-02-04 | Disposition: A | Discharge status: Home / Self Care | Payer: Medicaid Other | Attending: Emergency Medicine | Admitting: Emergency Medicine

## 2013-02-04 DIAGNOSIS — G8929 Other chronic pain: Secondary | ICD-10-CM

## 2013-02-04 MED ORDER — KETOROLAC TROMETHAMINE 60 MG/2ML IM SOLN: 60.0000 mg | Freq: Once | INTRAMUSCULAR | Status: DC

## 2013-02-04 MED ORDER — CYCLOBENZAPRINE HCL 10 MG PO TABS: 10.0000 mg | ORAL_TABLET | Freq: Three times a day (TID) | ORAL | Status: DC | PRN

## 2013-02-04 MED ORDER — NAPROXEN 375 MG PO TABS: 375.0000 mg | ORAL_TABLET | Freq: Two times a day (BID) | ORAL | Status: DC

## 2013-02-04 NOTE — ED Provider Notes (Signed)
CSN: [id number]     Arrival date & time 02/03/13  2358 History     First MD Initiated Contact with Patient 02/04/13 0025     Chief Complaint  Patient presents with  . Back Pain   (Consider location/radiation/quality/duration/timing/severity/associated sxs/prior Treatment) HPI  Patient reports he has had chronic back pain for the last 12 months. He states he's followed by Dr. Lerry Liner. States he ran out of his chronic pain medicine 2-3 days ago and he doesn't have a followup appointment until the first. He states he can't remember how many pain pills he gets but he thinks it is either 30 or 60 tablets. He states he's having his usual chronic low back pain that radiates to his right leg and indicates that in the back of his leg. He denies any numbness or tingling his leg. He states the only incontinence he has is during the night when he is on a CPAP machine for sleep apnea. He states sometimes he wakes up and he has had urinary incontinence. He never had urinary incontinence during the daytime. He denies any fecal incontinence. He states he prefers oxycodone because he feels the hydrocodone is upsetting his stomach. He states normally when he comes to the ER he gets oxycodone. He denies any precipitating injury. She states movement makes the pain worse.  Patient has diabetes and is on insulin and several diabetic oral agents. He states his A1c is high. He states he is waiting to get gastric bypass done.  PCP Dr Caroline Sauger   Past Medical History  Diagnosis Date  . Hypertension   . Diabetes mellitus   . Abscess   . Chronic back pain   . Sciatica   . Asthma   . Bronchitis   . OSA (obstructive sleep apnea)   . Sleep apnea     uses cpap   Past Surgical History  Procedure Laterality Date  . No past surgeries     Family History  Problem Relation Age of Onset  . Hypertension Mother   . Diabetes Mother   . Hypertension Father   . Diabetes Father   . Diabetes Brother   .  Hypertension Brother   . Colon cancer Sister    History  Substance Use Topics  . Smoking status: Former Smoker -- 0.50 packs/day for 24 years    Types: Cigarettes    Start date: 06/20/1987    Quit date: 12/18/2011  . Smokeless tobacco: Never Used  . Alcohol Use: No  on disability Uses CPAP at night with oxygen 2 lpm [place]  Review of Systems  All other systems reviewed and are negative.    Allergies  Sulfa antibiotics  Home Medications   Current Outpatient Rx  Name  Route  Sig  Dispense  Refill  . albuterol-ipratropium (COMBIVENT) 18-103 MCG/ACT inhaler   Inhalation   Inhale 2 puffs into the lungs every 6 (six) hours as needed. For shortness of breath         . amLODipine (NORVASC) 10 MG tablet   Oral   Take 10 mg by mouth daily.           . budesonide-formoterol (SYMBICORT) 160-4.5 MCG/ACT inhaler      Take 2 puffs first thing in am and then another 2 puffs about 12 hours later.   1 Inhaler   12   . Choline Fenofibrate (TRILIPIX) 135 MG capsule   Oral   Take 135 mg by mouth daily.         Marland Kitchen  cyanocobalamin (,VITAMIN B-12,) 1000 MCG/ML injection   Intramuscular   Inject 1,000 mcg into the muscle every 30 (thirty) days.         Marland Kitchen exenatide (BYETTA) 5 MCG/0.02ML SOLN   Subcutaneous   Inject 5 mcg into the skin every morning.         . fenofibrate 160 MG tablet   Oral   Take 160 mg by mouth daily.         . Fluticasone-Salmeterol (ADVAIR) 250-50 MCG/DOSE AEPB   Inhalation   Inhale 1 puff into the lungs every 12 (twelve) hours.         Marland Kitchen glipiZIDE (GLUCOTROL) 10 MG tablet   Oral   Take 10 mg by mouth 2 (two) times daily before a meal.           . hydrochlorothiazide 25 MG tablet   Oral   Take 25 mg by mouth daily.          Marland Kitchen HYDROcodone-acetaminophen (NORCO/VICODIN) 5-325 MG per tablet   Oral   Take 2 tablets by mouth every 4 (four) hours as needed for pain.   10 tablet   0   . insulin aspart (NOVOLOG) 100 UNIT/ML injection    Subcutaneous   Inject 15-18 Units into the skin 3 (three) times daily with meals.         . insulin glargine (LANTUS SOLOSTAR) 100 UNIT/ML injection   Subcutaneous   Inject 60 Units into the skin at bedtime.          . metFORMIN (GLUCOPHAGE) 1000 MG tablet   Oral   Take 1,000 mg by mouth 2 (two) times daily with a meal.           . omeprazole (PRILOSEC) 20 MG capsule   Oral   Take 1 capsule (20 mg total) by mouth daily.   30 capsule   11   . omeprazole (PRILOSEC) 20 MG capsule   Oral   Take 1 capsule (20 mg total) by mouth daily.   30 capsule   0   . ondansetron (ZOFRAN) 4 MG tablet   Oral   Take 1 tablet (4 mg total) by mouth every 6 (six) hours.   12 tablet   0   . pravastatin (PRAVACHOL) 20 MG tablet   Oral   Take 20 mg by mouth daily.         . sitaGLIPtin (JANUVIA) 25 MG tablet   Oral   Take 25 mg by mouth daily.            BP 133/91  Pulse 92  Temp(Src) 98.2 F (36.8 C) (Oral)  Resp 24  Ht 6\' 1"  (1.854 m)  Wt 383 lb (173.728 kg)  BMI 50.54 kg/m2  SpO2 94%  Vital signs normal   Physical Exam  Nursing note and vitals reviewed. Constitutional: He is oriented to person, place, and time. He appears well-developed and well-nourished.  Non-toxic appearance. He does not appear ill. No distress.  Morbidly obese  HENT:  Head: Normocephalic and atraumatic.  Right Ear: External ear normal.  Left Ear: External ear normal.  Nose: Nose normal. No mucosal edema or rhinorrhea.  Mouth/Throat: Oropharynx is clear and moist and mucous membranes are normal. No dental abscesses or edematous.  Eyes: Conjunctivae and EOM are normal. Pupils are equal, round, and reactive to light.  Neck: Normal range of motion and full passive range of motion without pain. Neck supple.  Pulmonary/Chest: Effort normal. No respiratory distress. He has  no rhonchi. He exhibits no crepitus.  Abdominal: Normal appearance.  Musculoskeletal: Normal range of motion. He exhibits no edema  and no tenderness.  Moves all extremities well. He has no pain with straight leg raising. His reflexes are depressed bilaterally which he states is usual. He has diffuse tenderness in his lumbar spine and over the right SI joint.  Neurological: He is alert and oriented to person, place, and time. He has normal strength. No cranial nerve deficit.  Skin: Skin is warm, dry and intact. No rash noted. No erythema. No pallor.  Psychiatric: He has a normal mood and affect. His speech is normal and behavior is normal. His mood appears not anxious.    ED Course   Procedures (including critical care time)  Reviewed the [facility] controlled substance site shows patient gets #90 hydrocodone 10/325 6 every month from Dr. Lerry Liner. He got prescriptions filled on June 9, July 7, and August 5. He also has several prescriptions from our ED for oxycodone.  This is the patient's seventh ED visit in the last 6 months mainly for his chronic back pain.  Patient refused Toradol IM prior to leaving the ED. I discussed with patient that we could not continue to provide him with his chronic pain medication. He would need to discuss his pain management with his PCP Dr. Mayford Knife.   1. Chronic back pain greater than 3 months duration      Discharge Medication List as of 02/04/2013 12:54 AM    START taking these medications   Details  cyclobenzaprine (FLEXERIL) 10 MG tablet Take 1 tablet (10 mg total) by mouth 3 (three) times daily as needed for muscle spasms., Starting 02/04/2013, Until Discontinued, Print    naproxen (NAPROSYN) 375 MG tablet Take 1 tablet (375 mg total) by mouth 2 (two) times daily., Starting 02/04/2013, Until Discontinued, Print        Plan discharge   Devoria Albe, MD, FACEP   MDM    Ward Givens, MD 02/04/13 [phone]

## 2013-02-04 NOTE — ED Notes (Signed)
Recurrent back pain. Pt states his sciatica is acting up.

## 2013-02-04 NOTE — ED Notes (Signed)
Pt alert & oriented x4, stable gait. Patient given discharge instructions, paperwork & prescription(s). Patient  instructed to stop at the registration desk to finish any additional paperwork. Patient verbalized understanding. Pt left department w/ no further questions. 

## 2013-02-13 ENCOUNTER — Ambulatory Visit: Payer: Medicaid Other | Admitting: Pulmonary Disease

## 2013-02-27 ENCOUNTER — Ambulatory Visit: Payer: Medicaid Other | Admitting: Pulmonary Disease

## 2013-03-21 ENCOUNTER — Ambulatory Visit: Payer: Medicaid Other | Admitting: Pulmonary Disease

## 2013-03-24 ENCOUNTER — Encounter: Payer: Self-pay | Admitting: Pulmonary Disease

## 2013-04-24 ENCOUNTER — Emergency Department (HOSPITAL_COMMUNITY)
Admission: EM | Admit: 2013-04-24 | Discharge: 2013-04-24 | Discharge status: Against Medical Advice | Payer: Medicaid Other | Attending: Emergency Medicine | Admitting: Emergency Medicine

## 2013-04-24 DIAGNOSIS — Z87891 Personal history of nicotine dependence: Secondary | ICD-10-CM | POA: Insufficient documentation

## 2013-04-24 DIAGNOSIS — R51 Headache: Secondary | ICD-10-CM | POA: Insufficient documentation

## 2013-04-24 DIAGNOSIS — J45909 Unspecified asthma, uncomplicated: Secondary | ICD-10-CM | POA: Insufficient documentation

## 2013-04-24 DIAGNOSIS — E119 Type 2 diabetes mellitus without complications: Secondary | ICD-10-CM | POA: Insufficient documentation

## 2013-04-24 DIAGNOSIS — I10 Essential (primary) hypertension: Secondary | ICD-10-CM | POA: Insufficient documentation

## 2013-04-24 NOTE — ED Notes (Signed)
No answer

## 2013-04-25 ENCOUNTER — Emergency Department (HOSPITAL_COMMUNITY): Payer: Medicaid Other

## 2013-04-25 ENCOUNTER — Encounter (HOSPITAL_COMMUNITY): Payer: Self-pay | Admitting: Emergency Medicine

## 2013-04-25 ENCOUNTER — Emergency Department (HOSPITAL_COMMUNITY)
Admission: EM | Admit: 2013-04-25 | Discharge: 2013-04-25 | Disposition: A | Discharge status: Home / Self Care | Payer: Medicaid Other | Attending: Emergency Medicine | Admitting: Emergency Medicine

## 2013-04-25 DIAGNOSIS — G8929 Other chronic pain: Secondary | ICD-10-CM | POA: Insufficient documentation

## 2013-04-25 DIAGNOSIS — J45901 Unspecified asthma with (acute) exacerbation: Secondary | ICD-10-CM | POA: Insufficient documentation

## 2013-04-25 DIAGNOSIS — Z79899 Other long term (current) drug therapy: Secondary | ICD-10-CM | POA: Insufficient documentation

## 2013-04-25 DIAGNOSIS — I10 Essential (primary) hypertension: Secondary | ICD-10-CM | POA: Insufficient documentation

## 2013-04-25 DIAGNOSIS — R51 Headache: Secondary | ICD-10-CM | POA: Insufficient documentation

## 2013-04-25 DIAGNOSIS — L723 Sebaceous cyst: Secondary | ICD-10-CM | POA: Insufficient documentation

## 2013-04-25 DIAGNOSIS — E119 Type 2 diabetes mellitus without complications: Secondary | ICD-10-CM | POA: Insufficient documentation

## 2013-04-25 DIAGNOSIS — Z87891 Personal history of nicotine dependence: Secondary | ICD-10-CM | POA: Insufficient documentation

## 2013-04-25 DIAGNOSIS — G4733 Obstructive sleep apnea (adult) (pediatric): Secondary | ICD-10-CM | POA: Insufficient documentation

## 2013-04-25 DIAGNOSIS — Z7982 Long term (current) use of aspirin: Secondary | ICD-10-CM | POA: Insufficient documentation

## 2013-04-25 DIAGNOSIS — Z872 Personal history of diseases of the skin and subcutaneous tissue: Secondary | ICD-10-CM | POA: Insufficient documentation

## 2013-04-25 LAB — GLUCOSE, CAPILLARY: Glucose-Capillary: 245 mg/dL — ABNORMAL HIGH (ref 70–99)

## 2013-04-25 MED ORDER — HYDROCODONE-ACETAMINOPHEN 5-325 MG PO TABS
2.0000 | ORAL_TABLET | Freq: Once | ORAL | Status: AC
  Administered 2013-04-25: 2 via ORAL
  Filled 2013-04-25: qty 2

## 2013-04-25 MED ORDER — METHOCARBAMOL 500 MG PO TABS: 500.0000 mg | ORAL_TABLET | Freq: Three times a day (TID) | ORAL | Status: DC

## 2013-04-25 MED ORDER — DIAZEPAM 5 MG PO TABS
10.0000 mg | ORAL_TABLET | Freq: Once | ORAL | Status: AC
  Administered 2013-04-25: 10 mg via ORAL
  Filled 2013-04-25: qty 2

## 2013-04-25 MED ORDER — ONDANSETRON HCL 4 MG PO TABS
4.0000 mg | ORAL_TABLET | Freq: Once | ORAL | Status: AC
  Administered 2013-04-25: 4 mg via ORAL
  Filled 2013-04-25: qty 1

## 2013-04-25 MED ORDER — OXYCODONE-ACETAMINOPHEN 5-325 MG PO TABS: 1.0000 | ORAL_TABLET | Freq: Four times a day (QID) | ORAL | Status: DC | PRN

## 2013-04-25 MED ORDER — HYDROCODONE-ACETAMINOPHEN 5-325 MG PO TABS: 1.0000 | ORAL_TABLET | ORAL | Status: DC | PRN

## 2013-04-25 NOTE — ED Provider Notes (Signed)
CSN: [id number]     Arrival date & time 04/25/13  4098 History   First MD Initiated Contact with Patient 04/25/13 0845     Chief Complaint  Patient presents with  . Headache   (Consider location/radiation/quality/duration/timing/severity/associated sxs/prior Treatment) HPI Comments: Pt c/o headache that started about 3 days ago. Pain started on the right side of the head. 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 The pain is now moving from right forehead to the back of the neck.  No recent injury.  No changes in medication or diet.  Pt had glasses, but has not worn glassed in 3 years. Pt is diabetic. Blood sugar at 240's mostly. This headache is similar to previous headaches except it is not going away with usual medication. He is also afraid of the fatty tumor, and the possibility of it causing problems. He has tried Lortab, ultram, and advill without any success.  Patient is a 42 y.o. male presenting with headaches. The history is provided by the patient.  Headache Associated symptoms: back pain   Associated symptoms: no abdominal pain, no cough, no dizziness, no neck pain, no photophobia and no seizures     Past Medical History  Diagnosis Date  . Hypertension   . Diabetes mellitus   . Abscess   . Chronic back pain   . Sciatica   . Asthma   . Bronchitis   . OSA (obstructive sleep apnea)   . Sleep apnea     uses cpap   Past Surgical History  Procedure Laterality Date  . No past surgeries     Family History  Problem Relation Age of Onset  . Hypertension Mother   . Diabetes Mother   . Hypertension Father   . Diabetes Father   . Diabetes Brother   . Hypertension Brother   . Colon cancer Sister    History  Substance Use Topics  . Smoking status: Former Smoker -- 0.50 packs/day for 24 years    Types: Cigarettes    Start date: 06/20/1987    Quit date: 12/18/2011  . Smokeless tobacco: Never Used  . Alcohol Use: No    Review of Systems   Constitutional: Negative for activity change.       All ROS Neg except as noted in HPI  HENT: Negative for nosebleeds.   Eyes: Negative for photophobia and discharge.  Respiratory: Positive for shortness of breath and wheezing. Negative for cough.   Cardiovascular: Negative for chest pain and palpitations.  Gastrointestinal: Negative for abdominal pain and blood in stool.  Genitourinary: Negative for dysuria, frequency and hematuria.  Musculoskeletal: Positive for back pain. Negative for arthralgias and neck pain.  Skin: Negative.   Neurological: Positive for headaches. Negative for dizziness, seizures and speech difficulty.  Psychiatric/Behavioral: Negative for hallucinations and confusion.    Allergies  Sulfa antibiotics  Home Medications   Current Outpatient Rx  Name  Route  Sig  Dispense  Refill  . albuterol-ipratropium (COMBIVENT) 18-103 MCG/ACT inhaler   Inhalation   Inhale 2 puffs into the lungs every 6 (six) hours as needed. For shortness of breath         . amLODipine (NORVASC) 10 MG tablet   Oral   Take 10 mg by mouth daily.           Marland Kitchen aspirin EC 325 MG tablet   Oral   Take 325 mg by mouth daily.         . budesonide-formoterol (  SYMBICORT) 160-4.5 MCG/ACT inhaler   Inhalation   Inhale 2 puffs into the lungs 2 (two) times daily as needed (shortness of breath).         . carvedilol (COREG) 12.5 MG tablet   Oral   Take 12.5 mg by mouth 2 (two) times daily with a meal.         . Choline Fenofibrate (TRILIPIX) 135 MG capsule   Oral   Take 135 mg by mouth daily.         . cyanocobalamin (,VITAMIN B-12,) 1000 MCG/ML injection   Intramuscular   Inject 1,000 mcg into the muscle every 30 (thirty) days.         . Fluticasone-Salmeterol (ADVAIR) 250-50 MCG/DOSE AEPB   Inhalation   Inhale 1 puff into the lungs 2 (two) times daily as needed (shortness of breath).          . hydrochlorothiazide 25 MG tablet   Oral   Take 25 mg by mouth daily.           Marland Kitchen ibuprofen (ADVIL,MOTRIN) 200 MG tablet   Oral   Take 400 mg by mouth every 6 (six) hours as needed for headache.         . insulin aspart (NOVOLOG) 100 UNIT/ML injection   Subcutaneous   Inject 35 Units into the skin 3 (three) times daily with meals.          . insulin glargine (LANTUS SOLOSTAR) 100 UNIT/ML injection   Subcutaneous   Inject 110 Units into the skin at bedtime.          . metFORMIN (GLUCOPHAGE) 1000 MG tablet   Oral   Take 1,000 mg by mouth 2 (two) times daily with a meal.           . omeprazole (PRILOSEC) 20 MG capsule   Oral   Take 1 capsule (20 mg total) by mouth daily.   30 capsule   0   . pravastatin (PRAVACHOL) 20 MG tablet   Oral   Take 20 mg by mouth daily.          BP 126/63  Pulse 91  Temp(Src) 97.9 F (36.6 C)  Resp 16  Ht 6\' 1"  (1.854 m)  Wt 400 lb (181.439 kg)  BMI 52.79 kg/m2  SpO2 94% Physical Exam  Nursing note and vitals reviewed. Constitutional: He is oriented to person, place, and time. He appears well-developed and well-nourished.  Non-toxic appearance.  HENT:  Head: Normocephalic.  Right Ear: Tympanic membrane and external ear normal.  Left Ear: Tympanic membrane and external ear normal.  Small fatty cyst of the  Anterior scalp area - nontender.  Eyes: EOM and lids are normal. Pupils are equal, round, and reactive to light.  Neck: Normal range of motion. Neck supple. Carotid bruit is not present.  Cardiovascular: Normal rate, regular rhythm, normal heart sounds, intact distal pulses and normal pulses.   Pulmonary/Chest: Breath sounds normal. No respiratory distress.  Abdominal: Soft. Bowel sounds are normal. There is no tenderness. There is no guarding.  Musculoskeletal: Normal range of motion.  Lymphadenopathy:       Head (right side): No submandibular adenopathy present.       Head (left side): No submandibular adenopathy present.    He has no cervical adenopathy.  Neurological: He is alert and oriented  to person, place, and time. He has normal strength. No cranial nerve deficit or sensory deficit. He exhibits normal muscle tone. Coordination normal.  Speech clear. Gait  steady. Strength symmetrical.  Skin: Skin is warm and dry.  Psychiatric: He has a normal mood and affect. His speech is normal.    ED Course  Procedures (including critical care time) Labs Review Labs Reviewed - No data to display Imaging Review No results found.  EKG Interpretation   None       MDM  No diagnosis found. **I have reviewed nursing notes, vital signs, and all appropriate lab and imaging results for this patient.* Head CT is a normal study. Pt ambulatory. Speech clear, No motor/sensory deficits. Pt to follow up with primary MD. He will return if any emergent changes. Rx for percocet given for pain.   Kathie Dike, PA-C 04/28/13 1101

## 2013-04-25 NOTE — ED Notes (Addendum)
Gradual headache beginning in R eye 2 days ago and has now spread over entire R side of head and neck.  Describes pain as constant pressure and throbbing.  Denies nausea, photosensitivity.  States he has had hx of "Right eye popping out" (literally).  Has not happened in 2 years.  No visual changes. Has been using Tylenol, Lortab, Ultram, Advil w/out any relief.  Keeps him awake at night.

## 2013-04-30 NOTE — ED Provider Notes (Signed)
Medical screening examination/treatment/procedure(s) were performed by non-physician practitioner and as supervising physician I was immediately available for consultation/collaboration.  EKG Interpretation   None         Shelda Jakes, MD 04/30/13 [phone]

## 2013-05-08 ENCOUNTER — Other Ambulatory Visit: Payer: Self-pay | Admitting: Adult Health

## 2013-05-08 NOTE — Telephone Encounter (Signed)
Patient needs to be seen before other refills can be given.

## 2013-07-10 ENCOUNTER — Emergency Department (HOSPITAL_COMMUNITY): Payer: Medicaid Other

## 2013-07-10 ENCOUNTER — Emergency Department (HOSPITAL_COMMUNITY)
Admission: EM | Admit: 2013-07-10 | Discharge: 2013-07-10 | Disposition: A | Discharge status: Home / Self Care | Payer: Medicaid Other | Attending: Emergency Medicine | Admitting: Emergency Medicine

## 2013-07-10 ENCOUNTER — Encounter (HOSPITAL_COMMUNITY): Payer: Self-pay | Admitting: Emergency Medicine

## 2013-07-10 ENCOUNTER — Other Ambulatory Visit: Payer: Self-pay | Admitting: Internal Medicine

## 2013-07-10 DIAGNOSIS — I10 Essential (primary) hypertension: Secondary | ICD-10-CM | POA: Insufficient documentation

## 2013-07-10 DIAGNOSIS — G8929 Other chronic pain: Secondary | ICD-10-CM | POA: Insufficient documentation

## 2013-07-10 DIAGNOSIS — Z79899 Other long term (current) drug therapy: Secondary | ICD-10-CM | POA: Insufficient documentation

## 2013-07-10 DIAGNOSIS — Z872 Personal history of diseases of the skin and subcutaneous tissue: Secondary | ICD-10-CM | POA: Insufficient documentation

## 2013-07-10 DIAGNOSIS — E119 Type 2 diabetes mellitus without complications: Secondary | ICD-10-CM | POA: Insufficient documentation

## 2013-07-10 DIAGNOSIS — M5430 Sciatica, unspecified side: Secondary | ICD-10-CM

## 2013-07-10 DIAGNOSIS — Z9981 Dependence on supplemental oxygen: Secondary | ICD-10-CM | POA: Insufficient documentation

## 2013-07-10 DIAGNOSIS — Z7982 Long term (current) use of aspirin: Secondary | ICD-10-CM | POA: Insufficient documentation

## 2013-07-10 DIAGNOSIS — G4733 Obstructive sleep apnea (adult) (pediatric): Secondary | ICD-10-CM | POA: Insufficient documentation

## 2013-07-10 DIAGNOSIS — Z794 Long term (current) use of insulin: Secondary | ICD-10-CM | POA: Insufficient documentation

## 2013-07-10 DIAGNOSIS — Z87891 Personal history of nicotine dependence: Secondary | ICD-10-CM | POA: Insufficient documentation

## 2013-07-10 DIAGNOSIS — R079 Chest pain, unspecified: Secondary | ICD-10-CM | POA: Insufficient documentation

## 2013-07-10 DIAGNOSIS — J45909 Unspecified asthma, uncomplicated: Secondary | ICD-10-CM | POA: Insufficient documentation

## 2013-07-10 LAB — GLUCOSE, CAPILLARY: GLUCOSE-CAPILLARY: 270 mg/dL — AB (ref 70–99)

## 2013-07-10 MED ORDER — OXYCODONE-ACETAMINOPHEN 5-325 MG PO TABS
1.0000 | ORAL_TABLET | Freq: Once | ORAL | Status: AC
  Administered 2013-07-10: 1 via ORAL
  Filled 2013-07-10: qty 1

## 2013-07-10 MED ORDER — CYCLOBENZAPRINE HCL 10 MG PO TABS: 10.0000 mg | ORAL_TABLET | Freq: Two times a day (BID) | ORAL | Status: DC | PRN

## 2013-07-10 MED ORDER — OXYCODONE-ACETAMINOPHEN 5-325 MG PO TABS: 1.0000 | ORAL_TABLET | ORAL | Status: DC | PRN

## 2013-07-10 NOTE — ED Provider Notes (Signed)
CSN: [id number]     Arrival date & time 07/10/13  1714 History   First MD Initiated Contact with Patient 07/10/13 1813     Chief Complaint  Patient presents with  . Back Pain   (Consider location/radiation/quality/duration/timing/severity/associated sxs/prior Treatment) Patient is a 43 y.o. male presenting with back pain. The history is provided by the patient.  Back Pain Location:  Lumbar spine Quality:  Aching Pain severity:  Severe Pain is:  Same all the time Onset quality:  Gradual Duration:  3 weeks Timing:  Constant Progression:  Worsening Chronicity:  New Relieved by:  Nothing Worsened by:  Movement, standing, twisting and ambulation Ineffective treatments:  Ibuprofen, OTC medications and narcotics Associated symptoms: no abdominal pain, no bladder incontinence, no bowel incontinence, no dysuria, no fever, no headaches, no leg pain and no weakness   Risk factors: lack of exercise and obesity    Shawn Newman is a 43 y.o. morbidly obese male who presents to the ED with low back pain that started 3 weeks ago. States that it feels like a pulled muscle. The pain radiates to the left side. History of sciatica. The pain is severe.  Past Medical History  Diagnosis Date  . Hypertension   . Diabetes mellitus   . Abscess   . Chronic back pain   . Sciatica   . Asthma   . Bronchitis   . OSA (obstructive sleep apnea)   . Sleep apnea     uses cpap   Past Surgical History  Procedure Laterality Date  . No past surgeries     Family History  Problem Relation Age of Onset  . Hypertension Mother   . Diabetes Mother   . Hypertension Father   . Diabetes Father   . Diabetes Brother   . Hypertension Brother   . Colon cancer Sister    History  Substance Use Topics  . Smoking status: Former Smoker -- 0.50 packs/day for 24 years    Types: Cigarettes    Start date: 06/20/1987    Quit date: 12/18/2011  . Smokeless tobacco: Never Used  . Alcohol Use: No    Review of Systems   Constitutional: Negative for fever and chills.  HENT: Negative for congestion, ear pain and sore throat.   Eyes: Negative for visual disturbance.  Gastrointestinal: Negative for nausea, vomiting, abdominal pain and bowel incontinence.  Genitourinary: Negative for bladder incontinence and dysuria.  Musculoskeletal: Positive for back pain.  Skin: Negative for rash.  Neurological: Negative for weakness and headaches.  Psychiatric/Behavioral: Negative for confusion. The patient is not nervous/anxious.     Allergies  Sulfa antibiotics  Home Medications   Current Outpatient Rx  Name  Route  Sig  Dispense  Refill  . albuterol-ipratropium (COMBIVENT) 18-103 MCG/ACT inhaler   Inhalation   Inhale 2 puffs into the lungs every 6 (six) hours as needed. For shortness of breath         . amLODipine (NORVASC) 10 MG tablet   Oral   Take 10 mg by mouth daily.           Marland Kitchen aspirin EC 325 MG tablet   Oral   Take 325 mg by mouth daily.         . budesonide-formoterol (SYMBICORT) 160-4.5 MCG/ACT inhaler   Inhalation   Inhale 2 puffs into the lungs 2 (two) times daily as needed (shortness of breath).         . carvedilol (COREG) 12.5 MG tablet   Oral  Take 12.5 mg by mouth 2 (two) times daily with a meal.         . Choline Fenofibrate (TRILIPIX) 135 MG capsule   Oral   Take 135 mg by mouth daily.         . cyanocobalamin (,VITAMIN B-12,) 1000 MCG/ML injection   Intramuscular   Inject 1,000 mcg into the muscle every 30 (thirty) days.         . Fluticasone-Salmeterol (ADVAIR) 250-50 MCG/DOSE AEPB   Inhalation   Inhale 1 puff into the lungs 2 (two) times daily as needed (shortness of breath).          . hydrochlorothiazide 25 MG tablet   Oral   Take 25 mg by mouth daily.          Marland Kitchen ibuprofen (ADVIL,MOTRIN) 200 MG tablet   Oral   Take 400 mg by mouth every 6 (six) hours as needed for headache.         . insulin aspart (NOVOLOG) 100 UNIT/ML injection    Subcutaneous   Inject 35 Units into the skin 3 (three) times daily with meals.          . insulin glargine (LANTUS SOLOSTAR) 100 UNIT/ML injection   Subcutaneous   Inject 110 Units into the skin at bedtime.          . metFORMIN (GLUCOPHAGE) 1000 MG tablet   Oral   Take 1,000 mg by mouth 2 (two) times daily with a meal.           . methocarbamol (ROBAXIN) 500 MG tablet   Oral   Take 1 tablet (500 mg total) by mouth 3 (three) times daily.   21 tablet   0   . methocarbamol (ROBAXIN) 500 MG tablet   Oral   Take 1 tablet (500 mg total) by mouth 3 (three) times daily.   21 tablet   0   . omeprazole (PRILOSEC) 20 MG capsule      TAKE 1 CAPSULE BY MOUTH ONCE A DAY.   30 capsule   1   . oxyCODONE-acetaminophen (PERCOCET/ROXICET) 5-325 MG per tablet   Oral   Take 1 tablet by mouth every 6 (six) hours as needed for severe pain.   15 tablet   0   . pravastatin (PRAVACHOL) 20 MG tablet   Oral   Take 20 mg by mouth daily.          BP 160/103  Pulse 94  Temp(Src) 98.1 F (36.7 C) (Oral)  Resp 18  Ht 6\' 1"  (1.854 m)  Wt 411 lb (186.428 kg)  BMI 54.24 kg/m2  SpO2 98% Physical Exam  Nursing note and vitals reviewed. Constitutional: He is oriented to person, place, and time. No distress.  Morbidly obese   HENT:  Head: Normocephalic and atraumatic.  Eyes: EOM are normal.  Neck: Neck supple.  Cardiovascular: Normal rate and regular rhythm.   Pulmonary/Chest: Effort normal and breath sounds normal.  Abdominal: Soft. Bowel sounds are normal. There is no tenderness.  Musculoskeletal:       Lumbar back: He exhibits decreased range of motion, tenderness and spasm. He exhibits no deformity, no laceration and normal pulse.       Back:  Neurological: He is alert and oriented to person, place, and time. He has normal strength. No cranial nerve deficit or sensory deficit. Gait normal.  Reflex Scores:      Patellar reflexes are 1+ on the right side and 1+ on the left  side. Pedal pulses equal bilateral.  Skin: Skin is warm and dry.  Psychiatric: He has a normal mood and affect.    ED Course  Procedures Results for orders placed during the hospital encounter of 07/10/13 (from the past 24 hour(s))  GLUCOSE, CAPILLARY     Status: Abnormal   Collection Time    07/10/13  5:37 PM      Result Value Range   Glucose-Capillary 270 (*) 70 - 99 mg/dL    Dg Ribs Unilateral W/chest Left  07/10/2013   CLINICAL DATA:  Left posterior rib pain.  No known injury.  EXAM: LEFT RIBS AND CHEST - 3+ VIEW  COMPARISON:  None.  FINDINGS: No fracture or other bone lesions are seen involving the ribs. There is no evidence of pneumothorax or pleural effusion. Both lungs are clear. Heart size and mediastinal contours are within normal limits.  IMPRESSION: Negative.   Electronically Signed   By: Abelardo Diesel M.D.   On: 07/10/2013 18:24    MDM  43 y.o. morbidly obese male with low back and left rib pain. Patient appears very uncomfortable. Will treat for pain and muscle spasm. He will follow up with his PCP or return as needed for problems. I have reviewed this patient's vital signs, nurses notes, appropriate labs and imaging.  I have discussed findings with the patient and plan of care. He voices understanding.    Medication List    TAKE these medications       cyclobenzaprine 10 MG tablet  Commonly known as:  FLEXERIL  Take 1 tablet (10 mg total) by mouth 2 (two) times daily as needed for muscle spasms.     oxyCODONE-acetaminophen 5-325 MG per tablet  Commonly known as:  ROXICET  Take 1 tablet by mouth every 4 (four) hours as needed for severe pain.      ASK your doctor about these medications       albuterol-ipratropium 18-103 MCG/ACT inhaler  Commonly known as:  COMBIVENT  Inhale 2 puffs into the lungs every 6 (six) hours as needed. For shortness of breath     amLODipine 10 MG tablet  Commonly known as:  NORVASC  Take 10 mg by mouth daily.     aspirin EC 325 MG  tablet  Take 325 mg by mouth daily.     budesonide-formoterol 160-4.5 MCG/ACT inhaler  Commonly known as:  SYMBICORT  Inhale 2 puffs into the lungs 2 (two) times daily as needed (shortness of breath).     carvedilol 12.5 MG tablet  Commonly known as:  COREG  Take 12.5 mg by mouth 2 (two) times daily with a meal.     cyanocobalamin 1000 MCG/ML injection  Commonly known as:  (VITAMIN B-12)  Inject 1,000 mcg into the muscle every 30 (thirty) days.     Fluticasone-Salmeterol 250-50 MCG/DOSE Aepb  Commonly known as:  ADVAIR  Inhale 1 puff into the lungs 2 (two) times daily as needed (shortness of breath).     hydrochlorothiazide 25 MG tablet  Commonly known as:  HYDRODIURIL  Take 25 mg by mouth daily.     ibuprofen 200 MG tablet  Commonly known as:  ADVIL,MOTRIN  Take 400 mg by mouth every 6 (six) hours as needed for headache.     insulin aspart 100 UNIT/ML injection  Commonly known as:  novoLOG  Inject 35 Units into the skin 3 (three) times daily with meals.     LANTUS SOLOSTAR 100 UNIT/ML injection  Generic drug:  insulin  glargine  Inject 110 Units into the skin at bedtime.     metFORMIN 1000 MG tablet  Commonly known as:  GLUCOPHAGE  Take 1,000 mg by mouth 2 (two) times daily with a meal.     methocarbamol 500 MG tablet  Commonly known as:  ROBAXIN  Take 1 tablet (500 mg total) by mouth 3 (three) times daily.  Ask about: Which instructions should I use?     omeprazole 20 MG capsule  Commonly known as:  PRILOSEC  TAKE 1 CAPSULE BY MOUTH ONCE A DAY.     pravastatin 20 MG tablet  Commonly known as:  PRAVACHOL  Take 20 mg by mouth daily.     TRILIPIX 135 MG capsule  Generic drug:  Choline Fenofibrate  Take 135 mg by mouth daily.           Ashley Murrain, [place] 07/10/13 [phone]

## 2013-07-10 NOTE — Discharge Instructions (Signed)
Your x-ray today is normal. We are treating your pain and muscle spasm. You will need to follow up with your doctor for further evaluation. Return here as needed. Do not take the narcotic pain medication if you are driving as it will make you sleepy.    Back Pain, Adult Back pain is very common. The pain often gets better over time. The cause of back pain is usually not dangerous. Most people can learn to manage their back pain on their own.  HOME CARE   Stay active. Start with short walks on flat ground if you can. Try to walk farther each day.  Do not sit, drive, or stand in one place for more than 30 minutes. Do not stay in bed.  Do not avoid exercise or work. Activity can help your back heal faster.  Be careful when you bend or lift an object. Bend at your knees, keep the object close to you, and do not twist.  Sleep on a firm mattress. Lie on your side, and bend your knees. If you lie on your back, put a pillow under your knees.  Only take medicines as told by your doctor.  Put ice on the injured area.  Put ice in a plastic bag.  Place a towel between your skin and the bag.  Leave the ice on for 15-20 minutes, 03-04 times a day for the first 2 to 3 days. After that, you can switch between ice and heat packs.  Ask your doctor about back exercises or massage.  Avoid feeling anxious or stressed. Find good ways to deal with stress, such as exercise. GET HELP RIGHT AWAY IF:   Your pain does not go away with rest or medicine.  Your pain does not go away in 1 week.  You have new problems.  You do not feel well.  The pain spreads into your legs.  You cannot control when you poop (bowel movement) or pee (urinate).  Your arms or legs feel weak or lose feeling (numbness).  You feel sick to your stomach (nauseous) or throw up (vomit).  You have belly (abdominal) pain.  You feel like you may pass out (faint). MAKE SURE YOU:   Understand these instructions.  Will watch  your condition.  Will get help right away if you are not doing well or get worse. Document Released: 11/22/2007 Document Revised: 08/28/2011 Document Reviewed: 10/24/2010 [facility] Patient Information 2014 [employer].

## 2013-07-10 NOTE — ED Notes (Signed)
Pt c/o pain in left side x 3 weeks.  Denies injury.  Pt says feels like has pulled muscle.  Reports history of lower back pain.

## 2013-07-10 NOTE — ED Notes (Signed)
Pt called for room x 3. In xray.

## 2013-07-11 NOTE — ED Provider Notes (Signed)
Medical screening examination/treatment/procedure(s) were performed by non-physician practitioner and as supervising physician I was immediately available for consultation/collaboration.  EKG Interpretation   None       Rolland Porter, MD, Abram Sander   Janice Norrie, MD 07/11/13 [phone]

## 2013-07-15 ENCOUNTER — Telehealth: Payer: Self-pay | Admitting: Internal Medicine

## 2013-07-15 MED ORDER — OMEPRAZOLE 20 MG PO CPDR: 20.0000 mg | DELAYED_RELEASE_CAPSULE | Freq: Every day | ORAL | Status: DC

## 2013-07-15 NOTE — Telephone Encounter (Signed)
Pt's spouse is aware that pt will need OV for refills. ROV has been scheduled for 07/22/13 at 10:15am. Rx will be sent in.

## 2013-07-22 ENCOUNTER — Ambulatory Visit: Payer: Medicaid Other | Admitting: Internal Medicine

## 2013-08-19 ENCOUNTER — Ambulatory Visit: Payer: Medicaid Other | Admitting: Internal Medicine

## 2013-08-29 ENCOUNTER — Emergency Department (HOSPITAL_COMMUNITY)
Admission: EM | Admit: 2013-08-29 | Discharge: 2013-08-29 | Disposition: A | Discharge status: Home / Self Care | Payer: Medicaid Other | Attending: Emergency Medicine | Admitting: Emergency Medicine

## 2013-08-29 ENCOUNTER — Encounter (HOSPITAL_COMMUNITY): Payer: Self-pay | Admitting: Emergency Medicine

## 2013-08-29 DIAGNOSIS — Z7982 Long term (current) use of aspirin: Secondary | ICD-10-CM | POA: Insufficient documentation

## 2013-08-29 DIAGNOSIS — IMO0002 Reserved for concepts with insufficient information to code with codable children: Secondary | ICD-10-CM | POA: Insufficient documentation

## 2013-08-29 DIAGNOSIS — E119 Type 2 diabetes mellitus without complications: Secondary | ICD-10-CM | POA: Insufficient documentation

## 2013-08-29 DIAGNOSIS — R519 Headache, unspecified: Secondary | ICD-10-CM

## 2013-08-29 DIAGNOSIS — Z794 Long term (current) use of insulin: Secondary | ICD-10-CM | POA: Insufficient documentation

## 2013-08-29 DIAGNOSIS — Z87891 Personal history of nicotine dependence: Secondary | ICD-10-CM | POA: Insufficient documentation

## 2013-08-29 DIAGNOSIS — J45909 Unspecified asthma, uncomplicated: Secondary | ICD-10-CM | POA: Insufficient documentation

## 2013-08-29 DIAGNOSIS — G4733 Obstructive sleep apnea (adult) (pediatric): Secondary | ICD-10-CM | POA: Insufficient documentation

## 2013-08-29 DIAGNOSIS — Z9981 Dependence on supplemental oxygen: Secondary | ICD-10-CM | POA: Insufficient documentation

## 2013-08-29 DIAGNOSIS — Z872 Personal history of diseases of the skin and subcutaneous tissue: Secondary | ICD-10-CM | POA: Insufficient documentation

## 2013-08-29 DIAGNOSIS — G8929 Other chronic pain: Secondary | ICD-10-CM | POA: Insufficient documentation

## 2013-08-29 DIAGNOSIS — Z8739 Personal history of other diseases of the musculoskeletal system and connective tissue: Secondary | ICD-10-CM | POA: Insufficient documentation

## 2013-08-29 DIAGNOSIS — I10 Essential (primary) hypertension: Secondary | ICD-10-CM | POA: Insufficient documentation

## 2013-08-29 DIAGNOSIS — Z79899 Other long term (current) drug therapy: Secondary | ICD-10-CM | POA: Insufficient documentation

## 2013-08-29 DIAGNOSIS — R51 Headache: Secondary | ICD-10-CM | POA: Insufficient documentation

## 2013-08-29 HISTORY — DX: Other chronic pain: G89.29

## 2013-08-29 HISTORY — DX: Headache, unspecified: R51.9

## 2013-08-29 HISTORY — DX: Headache: R51

## 2013-08-29 MED ORDER — METHOCARBAMOL 500 MG PO TABS: 1000.0000 mg | ORAL_TABLET | Freq: Four times a day (QID) | ORAL | Status: DC | PRN

## 2013-08-29 MED ORDER — HYDROCODONE-ACETAMINOPHEN 5-325 MG PO TABS: ORAL_TABLET | ORAL | Status: DC

## 2013-08-29 MED ORDER — OXYCODONE-ACETAMINOPHEN 5-325 MG PO TABS
2.0000 | ORAL_TABLET | Freq: Once | ORAL | Status: AC
  Administered 2013-08-29: 2 via ORAL
  Filled 2013-08-29: qty 2

## 2013-08-29 NOTE — ED Provider Notes (Signed)
CSN: [id number]     Arrival date & time 08/29/13  8295 History   First MD Initiated Contact with Patient 08/29/13 [phone]     Chief Complaint  Patient presents with  . Headache      HPI Pt was seen at 0850. Per pt, c/o gradual onset and persistence of constant acute flair of his chronic right sided headache for the past 1 week.  Describes the headache as per his usual chronic headache pain pattern for several years. 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  Denies visual changes, no focal motor weakness, no tingling/numbness in extremities, no fevers, no neck pain, no rash, no injury.  The symptoms have been associated with no other complaints. The patient has a significant history of similar symptoms previously, recently being evaluated for this complaint and multiple prior evals for same.      Past Medical History  Diagnosis Date  . Hypertension   . Diabetes mellitus   . Abscess   . Chronic back pain   . Sciatica   . Asthma   . Bronchitis   . OSA (obstructive sleep apnea)   . Sleep apnea     uses cpap  . Headache   . Chronic pain    Past Surgical History  Procedure Laterality Date  . No past surgeries     Family History  Problem Relation Age of Onset  . Hypertension Mother   . Diabetes Mother   . Hypertension Father   . Diabetes Father   . Diabetes Brother   . Hypertension Brother   . Colon cancer Sister    History  Substance Use Topics  . Smoking status: Former Smoker -- 0.50 packs/day for 24 years    Types: Cigarettes    Start date: 06/20/1987    Quit date: 12/18/2011  . Smokeless tobacco: Never Used  . Alcohol Use: No    Review of Systems ROS: Statement: All systems negative except as marked or noted in the HPI; Constitutional: Negative for fever and chills. ; ; Eyes: Negative for eye pain, redness and discharge. ; ; ENMT: Negative for  ear pain, hoarseness, nasal congestion, sinus pressure and sore throat. ; ; Cardiovascular: Negative for chest pain, palpitations, diaphoresis, dyspnea and peripheral edema. ; ; Respiratory: Negative for cough, wheezing and stridor. ; ; Gastrointestinal: Negative for nausea, vomiting, diarrhea, abdominal pain, blood in stool, hematemesis, jaundice and rectal bleeding. . ; ; Genitourinary: Negative for dysuria, flank pain and hematuria. ; ; Musculoskeletal: Negative for back pain and neck pain. Negative for swelling and trauma.; ; Skin: +lipoma. Negative for pruritus, rash, abrasions, blisters, bruising and skin lesion.; ; Neuro: +headache. Negative for lightheadedness and neck stiffness. Negative for weakness, altered level of consciousness , altered mental status, extremity weakness, paresthesias, involuntary movement, seizure and syncope.      Allergies  Sulfa antibiotics  Home Medications   Current Outpatient Rx  Name  Route  Sig  Dispense  Refill  . albuterol-ipratropium (COMBIVENT) 18-103 MCG/ACT inhaler   Inhalation   Inhale 2 puffs into the lungs every 6 (six) hours as needed. For shortness of breath         . amLODipine (NORVASC) 10 MG tablet   Oral   Take 10 mg by mouth daily.           Marland Kitchen aspirin EC 325  MG tablet   Oral   Take 325 mg by mouth daily.         . budesonide-formoterol (SYMBICORT) 160-4.5 MCG/ACT inhaler   Inhalation   Inhale 2 puffs into the lungs 2 (two) times daily as needed (shortness of breath).         . carvedilol (COREG) 12.5 MG tablet   Oral   Take 12.5 mg by mouth 2 (two) times daily with a meal.         . Choline Fenofibrate (TRILIPIX) 135 MG capsule   Oral   Take 135 mg by mouth daily.         . Fluticasone-Salmeterol (ADVAIR) 250-50 MCG/DOSE AEPB   Inhalation   Inhale 1 puff into the lungs 2 (two) times daily as needed (shortness of breath).          . hydrochlorothiazide 25 MG tablet   Oral   Take 25 mg by mouth daily.           Marland Kitchen ibuprofen (ADVIL,MOTRIN) 200 MG tablet   Oral   Take 400 mg by mouth every 6 (six) hours as needed for headache.         . insulin aspart (NOVOLOG) 100 UNIT/ML injection   Subcutaneous   Inject 35 Units into the skin 3 (three) times daily with meals.          . insulin glargine (LANTUS SOLOSTAR) 100 UNIT/ML injection   Subcutaneous   Inject 110 Units into the skin at bedtime.          . metFORMIN (GLUCOPHAGE) 1000 MG tablet   Oral   Take 1,000 mg by mouth 2 (two) times daily with a meal.           . omeprazole (PRILOSEC) 20 MG capsule   Oral   Take 1 capsule (20 mg total) by mouth daily.   30 capsule   0   . pravastatin (PRAVACHOL) 20 MG tablet   Oral   Take 20 mg by mouth daily.         . cyanocobalamin (,VITAMIN B-12,) 1000 MCG/ML injection   Intramuscular   Inject 1,000 mcg into the muscle every 30 (thirty) days.          BP 131/76  Pulse 100  Temp(Src) 98.3 F (36.8 C)  Resp 18  Ht 6\' 1"  (1.854 m)  Wt 411 lb (186.428 kg)  BMI 54.24 kg/m2  SpO2 96% Physical Exam 0855: Physical examination:  Nursing notes reviewed; Vital signs and O2 SAT reviewed;  Constitutional: Well developed, Well nourished, Well hydrated, In no acute distress; Head:  Normocephalic, atraumatic. +right anterior scalp with small palp lipoma which is NT to palp without overlying erythema, open wounds, or ecchymosis. ; Eyes: EOMI, PERRL, No scleral icterus; ENMT: TM's clear bilat. +edemetous nasal turbinates bilat with clear rhinorrhea. Mouth and pharynx normal, Mucous membranes moist; Neck: Supple, Full range of motion, No lymphadenopathy; Cardiovascular: Regular rate and rhythm, No gallop; Respiratory: Breath sounds clear & equal bilaterally, No rales, rhonchi, wheezes.  Speaking full sentences with ease, Normal respiratory effort/excursion; Chest: Nontender, Movement normal; Abdomen: Soft, Nontender, Nondistended, Normal bowel sounds; Genitourinary: No CVA tenderness; Extremities:  Pulses normal, No tenderness, No edema, No calf edema or asymmetry.; Neuro: AA&Ox3, Major CN grossly intact.  Speech clear. No gross focal motor or sensory deficits in extremities. Climbs on and off stretcher easily by himself. Gait steady.; Skin: Color normal, Warm, Dry.   ED Course  Procedures  EKG Interpretation None      MDM  MDM Reviewed: previous chart, nursing note and vitals Reviewed previous: CT scan     0915:  Neuro exam intact, no need for imaging at this time. Dx and previous testing (normal CT-H in 04/2013) d/w pt and family.  Questions answered.  Verb understanding. Agreeable to d/c home with outpt f/u. Long hx of chronic pain with multiple ED visits for same.  Pt endorses acute flair of his usual long standing chronic pain today, no change from his usual chronic pain pattern.  Pt encouraged to f/u with his PMD for good continuity of care and control of his chronic pain.  Verb understanding.      Alfonzo Feller, DO 09/01/13 1815

## 2013-08-29 NOTE — ED Notes (Signed)
Pt alert & oriented x4, stable gait. Patient given discharge instructions, paperwork & prescription(s). Patient  instructed to stop at the registration desk to finish any additional paperwork. Patient verbalized understanding. Pt left department w/ no further questions. 

## 2013-08-29 NOTE — Discharge Instructions (Signed)
°Emergency Department Resource Guide °1) Find a Doctor and Pay Out of Pocket °Although you won't have to find out who is covered by your insurance plan, it is a good idea to ask around and get recommendations. You will then need to call the office and see if the doctor you have chosen will accept you as a new patient and what types of options they offer for patients who are self-pay. Some doctors offer discounts or will set up payment plans for their patients who do not have insurance, but you will need to ask so you aren't surprised when you get to your appointment. ° °2) Contact Your Local Health Department °Not all health departments have doctors that can see patients for sick visits, but many do, so it is worth a call to see if yours does. If you don't know where your local health department is, you can check in your phone book. The CDC also has a tool to help you locate your state's health department, and many state websites also have listings of all of their local health departments. ° °3) Find a Walk-in Clinic °If your illness is not likely to be very severe or complicated, you may want to try a walk in clinic. These are popping up all over the country in pharmacies, drugstores, and shopping centers. They're usually staffed by nurse practitioners or physician assistants that have been trained to treat common illnesses and complaints. They're usually fairly quick and inexpensive. However, if you have serious medical issues or chronic medical problems, these are probably not your best option. ° °No Primary Care Doctor: °- Call [facility] at  832-8000 - they can help you locate a primary care doctor that  accepts your insurance, provides certain services, etc. °- Physician Referral Service- 1-800-533-3463 ° °Chronic Pain Problems: °Organization         Address  Phone   Notes  °[facility]  (336) 297-2271 Patients need to be referred by their primary care doctor.  ° °Medication  Assistance: °Organization         Address  Phone   Notes  °[place] [facility] [address] °[place], [place] [address] (336) 641-8030 --Must be a resident of [place] °-- Must have NO insurance coverage whatsoever (no Medicaid/ Medicare, etc.) °-- The pt. MUST have a primary care doctor that directs their care regularly and follows them in the community °  °[facility]  (866) 331-1348   °[employer]  (888) 892-1162   ° °Agencies that provide inexpensive medical care: °Organization         Address  Phone   Notes  °[facility]  (336) 832-8035   °[facility]    (336) 832-7272   °[facility] [address] °[place], [place] [address] (336) 832-4777   °[facility] [address], °[place] (336) 271-4999   °[facility]    (336) 373-0678   °[facility]    (336) 272-1050   °[facility] ° [address], [place] Phone:  (336) 832-4444, Fax:  (336) 832-4440 Hours of Operation:  9 am - 6 pm, M-F.  Also accepts Medicaid/Medicare and self-pay.  °[facility] ° [address], [place] Phone: (336) 832-3150, Fax: (336) [phone]. Hours of Operation:  8:30 am - 5:30 pm, M-F.  Also accepts Medicaid and self-pay.  °[facility] [place] [address], [place] Phone: (336) 878-6027   °[facility] [address], [place], [place] (336)723-1848, Ext. 123 Mondays & Thursdays: 7-9 AM.  First 15 patients are seen on a first come, first serve basis. °  ° °Medicaid-accepting [place] Providers: ° °Organization         Address  Phone   Notes  °[facility] [address], [place] (336) 641-2100 Also accepts self-pay patients.  °[facility] [address], [place] ° (336) 856-9996   °[facility] [address], [place]  (336) 288-8857   °[facility] [address] [place] [address], [place] (336) 299-7000   °Veita Bland [address], [place]  ° (336) 373-1557 Only accepts [place] Access Medicaid patients after they have their name applied to their card.  ° °Self-Pay (no insurance) in [place]: ° °Organization         Address  Phone   Notes  °Sickle Cell Patients, [facility] [address], [place] (336) 832-1970   °[facility] [address], [place] (336) 832-4400   °[facility] ° [address], [place] (336) 992-4800   °[facility]/Dr. Osei-Bonsu ° [address] [place] [address], [place] or [address], [place] (336) 841-8500 Phone number for both [place] and [place] locations is the same.  °[facility] [address], [place] (336) 299-0000   °[facility] [place] [address] [place] [address], [place] or [address] (336) 852-7530 °(336) 878-2260   °[facility] [address], [place] (336) 350-1642, phone; (336) 294-5005, fax Sees patients 1st and 3rd Saturday of every month.  Must not qualify for public or private insurance (i.e. Medicaid, Medicare, [place] Health Choice, Veterans' Benefits) • Household income should be no more than 200% of the poverty level •The clinic cannot treat you if you are pregnant or think you are pregnant • Sexually transmitted diseases are not treated at the clinic.  ° ° °Dental Care: °Organization         Address  Phone  Notes  °[place] [facility] [address], [place] (336) 641-6152 Accepts children up to age 21 who are enrolled in Medicaid or [place] Health Choice; pregnant women with a Medicaid card; and children who have applied for Medicaid or [place] Health Choice, but were declined, whose parents can pay a reduced fee at time of service.  °[place]  [facility] [place]  [address], [place] (336) 641-7733 Accepts children up to age 21 who are enrolled in Medicaid or [place] Health Choice; pregnant women with a Medicaid card; and children who have applied for Medicaid or [place] Health Choice, but were declined, whose parents can pay a reduced fee at time of service.  °[facility] ° [address], [place] (336) 641-4533 Patients are seen by appointment only. Walk-ins are not accepted. [facility] will see patients 18 years of age and older. °Monday - Tuesday (8am-5pm) °Most Wednesdays (8:30-5pm) °$30 per visit, cash only  °[facility] ° [address], [place] (336) 641-4533 Patients are seen by appointment only. Walk-ins are not accepted. [facility] will see patients 18 years of age and older. °One   Wednesday Evening (Monthly: Volunteer Based).  $30 per visit, cash only  °[facility]  (919) 537-3737 for adults; Children under age 4, call Graduate Pediatric Dentistry at ([phone]) [phone]. Children aged 4-14, please call ([phone]) [phone] to request a pediatric application. ° Dental services are provided in all areas of dental care including fillings, crowns and bridges, complete and partial dentures, implants, gum treatment, root canals, and extractions. Preventive care is also provided. Treatment is provided to both adults and children. °Patients are selected via a lottery and there is often a waiting list. °  °[facility] [address], °[place] ° (336) 763-8833 [email or web address].[email or web address] °  °[facility] [address], [place], [place] (336)723-1848, Ext. 123 Second and Fourth Thursday of each month, opens at 6:30 AM; Clinic ends at 9 AM.  Patients are seen on a first-come first-served basis, and a limited number are seen during each clinic.  ° °[facility] ° [address], [place], [place] (336) 723-7904    Eligibility Requirements °You must have lived in [place], [place], or [place] counties for at least the last three months. °  You cannot be eligible for state or federal sponsored healthcare insurance, including Veterans Administration, Medicaid, or Medicare. °  You generally cannot be eligible for healthcare insurance through your employer.  °  How to apply: °Eligibility screenings are held every Tuesday and Wednesday afternoon from 1:00 pm until 4:00 pm. You do not need an appointment for the interview!  °[facility] [address], [place], [place] 336-631-2330   °[facility]  336-342-8273   °[facility]  336-703-3100   °[facility]  336-570-6415   ° °Behavioral Health Resources in the Community: °Intensive Outpatient Programs °Organization         Address  Phone  Notes  °[facility] [address], [place], [place] 336-878-6098   °[facility] [address], [place], [place] 336-832-9800   °[facility] [address], [place], [place] ° 336-882-2125   °[facility] [address],  °[place], [place] 1-800-853-5163 or 336-641-4981   °Substance Abuse Resources °Organization         Address  Phone  Notes  °[facility]  336-882-2125   °[facility]  336-784-9470   °[facility] [place] [facility]  336-285-9073   °[facility]  336-845-3988   °Residential & Outpatient Substance Abuse Program  1-800-659-3381   °Psychological Services °Organization         Address  Phone  Notes  °[facility]  336- 832-9600   °[facility]  336- 378-7881   °[facility] [address], [place] 1-800-853-5163 or 336-641-4981   ° °Mobile Crisis Teams °Organization         Address  Phone  Notes  °[facility], Mobile Crisis Care Unit  1-877-626-1772   °Assertive °[facility] ° [address]  [place], [place] 336-834-9664   °Sharon DeEsch [address] °[place] 336-554-5454   ° °Self-Help/Support Groups °Organization         Address  Phone             Notes  °[facility] - variety of support groups  336- 373-1402 Call for more information  °Narcotics Anonymous (NA), [facility] [address], °[place]   2 meetings at this location  ° °  Residential Treatment Programs Organization         Address  Phone  Notes  [facility] [address]    [place]  1-850 488 8870   [facility]  [address] [place] [address], [place], [place]   [place], [place] [phone] Admissions: 8am-3pm M-F  [facility] [address],    [place], [place] [phone]   [facility] [address] [place], [place], [place]   [facility] [address]  [place], [place]   [facility] - Intensive Outpatient [address], [place], [place]   [facility] ([place]  [place], [place] 1-3653685220 or [phone]   [facility] [address] [place], [place] Accepts Medicaid  [facility] [address]  [place] 1-579-640-7855 Substance Abuse/Addiction Treatment   [facility] Organization         Address  Phone  Notes  [facility]  [phone]   Domenic Schwab, PhD [address] [place], [place]   (762)813-2722 or [phone]   [facility] [employer] [place], [place] [phone]   [facility] [address] [place], [place] [phone] Insurance/Medicaid/sponsorship through [facility] [address]                                    [place], [place] [phone] [place] [address], [place] [phone]    Dr. Adele Schilder  [phone]   [facility] Dept. 1) [address], [place] 2) [place] 3)  [address], [place] [phone]  ([phone]) [phone]   [place] [phone] or [phone] (After Hours)      Take the prescriptions as directed.  Call your regular medical doctor today to schedule a follow up appointment within the next week.  Return to the Emergency Department immediately sooner if worsening.

## 2013-08-29 NOTE — ED Notes (Signed)
Pt c/o right side ha x 1 week.

## 2013-09-11 ENCOUNTER — Other Ambulatory Visit: Payer: Self-pay | Admitting: Internal Medicine

## 2013-09-13 ENCOUNTER — Other Ambulatory Visit: Payer: Self-pay | Admitting: Internal Medicine

## 2013-09-30 ENCOUNTER — Emergency Department (HOSPITAL_COMMUNITY)
Admission: EM | Admit: 2013-09-30 | Discharge: 2013-09-30 | Disposition: A | Discharge status: Home / Self Care | Payer: Medicaid Other | Attending: Emergency Medicine | Admitting: Emergency Medicine

## 2013-09-30 ENCOUNTER — Encounter (HOSPITAL_COMMUNITY): Payer: Self-pay | Admitting: Emergency Medicine

## 2013-09-30 DIAGNOSIS — IMO0002 Reserved for concepts with insufficient information to code with codable children: Secondary | ICD-10-CM | POA: Insufficient documentation

## 2013-09-30 DIAGNOSIS — L02419 Cutaneous abscess of limb, unspecified: Secondary | ICD-10-CM

## 2013-09-30 DIAGNOSIS — Z7982 Long term (current) use of aspirin: Secondary | ICD-10-CM | POA: Insufficient documentation

## 2013-09-30 DIAGNOSIS — J45909 Unspecified asthma, uncomplicated: Secondary | ICD-10-CM | POA: Insufficient documentation

## 2013-09-30 DIAGNOSIS — Z87891 Personal history of nicotine dependence: Secondary | ICD-10-CM | POA: Insufficient documentation

## 2013-09-30 DIAGNOSIS — G4733 Obstructive sleep apnea (adult) (pediatric): Secondary | ICD-10-CM | POA: Insufficient documentation

## 2013-09-30 DIAGNOSIS — G8929 Other chronic pain: Secondary | ICD-10-CM | POA: Insufficient documentation

## 2013-09-30 DIAGNOSIS — R739 Hyperglycemia, unspecified: Secondary | ICD-10-CM

## 2013-09-30 DIAGNOSIS — E119 Type 2 diabetes mellitus without complications: Secondary | ICD-10-CM | POA: Insufficient documentation

## 2013-09-30 DIAGNOSIS — Z79899 Other long term (current) drug therapy: Secondary | ICD-10-CM | POA: Insufficient documentation

## 2013-09-30 DIAGNOSIS — R300 Dysuria: Secondary | ICD-10-CM | POA: Insufficient documentation

## 2013-09-30 DIAGNOSIS — Z794 Long term (current) use of insulin: Secondary | ICD-10-CM | POA: Insufficient documentation

## 2013-09-30 DIAGNOSIS — Z9981 Dependence on supplemental oxygen: Secondary | ICD-10-CM | POA: Insufficient documentation

## 2013-09-30 LAB — URINALYSIS, ROUTINE W REFLEX MICROSCOPIC
Bilirubin Urine: NEGATIVE
Glucose, UA: >1000 mg/dL — AB
Hgb urine dipstick: NEGATIVE
Ketones, ur: NEGATIVE mg/dL
Leukocytes, UA: NEGATIVE
NITRITE: NEGATIVE
Protein, ur: NEGATIVE mg/dL
SPECIFIC GRAVITY, URINE: 1.02 (ref 1.005–1.030)
UROBILINOGEN UA: 0.2 mg/dL (ref 0.0–1.0)
pH: 6 (ref 5.0–8.0)

## 2013-09-30 LAB — URINE MICROSCOPIC-ADD ON

## 2013-09-30 LAB — CBG MONITORING, ED: GLUCOSE-CAPILLARY: 300 mg/dL — AB (ref 70–99)

## 2013-09-30 LAB — BASIC METABOLIC PANEL
BUN: 11 mg/dL (ref 6–23)
CO2: 28 mEq/L (ref 19–32)
Calcium: 9.7 mg/dL (ref 8.4–10.5)
Chloride: 98 mEq/L (ref 96–112)
Creatinine, Ser: 0.59 mg/dL (ref 0.50–1.35)
Glucose, Bld: 304 mg/dL — ABNORMAL HIGH (ref 70–99)
POTASSIUM: 4.1 meq/L (ref 3.7–5.3)
SODIUM: 138 meq/L (ref 137–147)

## 2013-09-30 MED ORDER — OXYCODONE-ACETAMINOPHEN 5-325 MG PO TABS
1.0000 | ORAL_TABLET | Freq: Once | ORAL | Status: AC
  Administered 2013-09-30: 1 via ORAL
  Filled 2013-09-30: qty 1

## 2013-09-30 MED ORDER — DOXYCYCLINE HYCLATE 100 MG PO CAPS: 100.0000 mg | ORAL_CAPSULE | Freq: Two times a day (BID) | ORAL | Status: DC

## 2013-09-30 MED ORDER — OXYCODONE-ACETAMINOPHEN 5-325 MG PO TABS: 1.0000 | ORAL_TABLET | ORAL | Status: DC | PRN

## 2013-09-30 MED ORDER — DOXYCYCLINE HYCLATE 100 MG PO TABS
100.0000 mg | ORAL_TABLET | Freq: Once | ORAL | Status: AC
  Administered 2013-09-30: 100 mg via ORAL
  Filled 2013-09-30: qty 1

## 2013-09-30 NOTE — ED Provider Notes (Signed)
CSN: [id number]     Arrival date & time 09/30/13  0744 History   First MD Initiated Contact with Patient 09/30/13 [phone]     Chief Complaint  Patient presents with  . Abscess     (Consider location/radiation/quality/duration/timing/severity/associated sxs/prior Treatment) Patient is a 43 y.o. male presenting with abscess. The history is provided by the patient.  Abscess Abscess location: Right axilla. Abscess quality: draining and painful   Abscess quality: no fluctuance, no induration, no itching, no redness, no warmth and not weeping   Red streaking: no   Duration:  1 day Progression:  Partially resolved Pain details:    Quality:  Pressure   Severity:  Moderate   Timing:  Constant   Progression:  Unchanged Chronicity:  New Context: diabetes   Context: not immunosuppression, not injected drug use, not insect bite/sting and not skin injury   Relieved by:  Warm compresses and draining/squeezing Worsened by:  Nothing tried Ineffective treatments:  None tried Associated symptoms: no fever, no nausea and no vomiting   Risk factors: prior abscess    43 y.o. Male presents w/one day history of abscess in R axilla.  States it has been draining w/foul odor.  Has been using warm compresses w/partial resolution.  Also c/o stinging w/urination and foul odor and darkened color of urine. Denies fever, vomiting, flank pain, testicular pain and penile discharge.  States blood glucose at home has been averaging around 280 and he has been compliant w/diabetic medications.  Past Medical History  Diagnosis Date  . Hypertension   . Diabetes mellitus   . Abscess   . Chronic back pain   . Sciatica   . Asthma   . Bronchitis   . OSA (obstructive sleep apnea)   . Sleep apnea     uses cpap  . Headache   . Chronic pain    Past Surgical History  Procedure Laterality Date  . No past surgeries     Family History  Problem Relation Age of Onset  . Hypertension Mother   . Diabetes Mother   .  Hypertension Father   . Diabetes Father   . Diabetes Brother   . Hypertension Brother   . Colon cancer Sister    History  Substance Use Topics  . Smoking status: Former Smoker -- 0.50 packs/day for 24 years    Types: Cigarettes    Start date: 06/20/1987    Quit date: 12/18/2011  . Smokeless tobacco: Never Used  . Alcohol Use: No    Review of Systems  Constitutional: Negative for fever and chills.  Gastrointestinal: Negative for nausea and vomiting.  Genitourinary: Positive for dysuria, urgency and frequency. Negative for hematuria, flank pain, discharge, difficulty urinating, genital sores and testicular pain.  Musculoskeletal: Negative for arthralgias and joint swelling.  Skin: Positive for color change.       Abscess   Hematological: Negative for adenopathy.  All other systems reviewed and are negative.     Allergies  Sulfa antibiotics  Home Medications   Prior to Admission medications   Medication Sig Start Date End Date Taking? Authorizing Provider  albuterol-ipratropium (COMBIVENT) 18-103 MCG/ACT inhaler Inhale 2 puffs into the lungs every 6 (six) hours as needed. For shortness of breath    Historical Provider, MD  amLODipine (NORVASC) 10 MG tablet Take 10 mg by mouth daily.      Historical Provider, MD  aspirin EC 325 MG tablet Take 325 mg by mouth daily.    Historical Provider, MD  budesonide-formoterol (  SYMBICORT) 160-4.5 MCG/ACT inhaler Inhale 2 puffs into the lungs 2 (two) times daily as needed (shortness of breath).    Historical Provider, MD  carvedilol (COREG) 12.5 MG tablet Take 12.5 mg by mouth 2 (two) times daily with a meal.    Historical Provider, MD  Choline Fenofibrate (TRILIPIX) 135 MG capsule Take 135 mg by mouth daily.    Historical Provider, MD  cyanocobalamin (,VITAMIN B-12,) 1000 MCG/ML injection Inject 1,000 mcg into the muscle every 30 (thirty) days.    Historical Provider, MD  Fluticasone-Salmeterol (ADVAIR) 250-50 MCG/DOSE AEPB Inhale 1 puff  into the lungs 2 (two) times daily as needed (shortness of breath).     Historical Provider, MD  hydrochlorothiazide 25 MG tablet Take 25 mg by mouth daily.     Historical Provider, MD  HYDROcodone-acetaminophen (NORCO/VICODIN) 5-325 MG per tablet 1 or 2 tabs PO q6 hours prn pain 08/29/13   Alfonzo Feller, DO  ibuprofen (ADVIL,MOTRIN) 200 MG tablet Take 400 mg by mouth every 6 (six) hours as needed for headache.    Historical Provider, MD  insulin aspart (NOVOLOG) 100 UNIT/ML injection Inject 35 Units into the skin 3 (three) times daily with meals.     Historical Provider, MD  insulin glargine (LANTUS SOLOSTAR) 100 UNIT/ML injection Inject 110 Units into the skin at bedtime.     Historical Provider, MD  metFORMIN (GLUCOPHAGE) 1000 MG tablet Take 1,000 mg by mouth 2 (two) times daily with a meal.      Historical Provider, MD  methocarbamol (ROBAXIN) 500 MG tablet Take 2 tablets (1,000 mg total) by mouth 4 (four) times daily as needed for muscle spasms (muscle spasm/pain). 08/29/13   Alfonzo Feller, DO  omeprazole (PRILOSEC) 20 MG capsule Take 1 capsule (20 mg total) by mouth daily. 07/15/13   Tanda Rockers, MD  pravastatin (PRAVACHOL) 20 MG tablet Take 20 mg by mouth daily.    Historical Provider, MD   BP 130/79  Pulse 97  Temp(Src) 97.8 F (36.6 C) (Oral)  Resp 22  Ht 6\' 1"  (1.854 m)  Wt 416 lb (188.696 kg)  BMI 54.90 kg/m2  SpO2 92% Physical Exam  Nursing note and vitals reviewed. Constitutional: He is oriented to person, place, and time. He appears well-developed and well-nourished. No distress.  HENT:  Head: Normocephalic and atraumatic.  Cardiovascular: Normal rate, regular rhythm and normal heart sounds.   No murmur heard. Pulmonary/Chest: Effort normal and breath sounds normal. No respiratory distress.  Genitourinary: No penile tenderness.  Neurological: He is alert and oriented to person, place, and time. He exhibits normal muscle tone. Coordination normal.  Skin: Skin is  warm and dry. There is erythema.  Abscess to the R posterior axilla with moderate purulent drainage present.  Mild induration w/out warmth or surrounding erythema.  Tender to palpation.    ED Course  Procedures (including critical care time) Labs Review Labs Reviewed  URINALYSIS, ROUTINE W REFLEX MICROSCOPIC - Abnormal; Notable for the following:    Glucose, UA >1000 (*)    All other components within normal limits  BASIC METABOLIC PANEL - Abnormal; Notable for the following:    Glucose, Bld 304 (*)    All other components within normal limits  CBG MONITORING, ED - Abnormal; Notable for the following:    Glucose-Capillary 300 (*)    All other components within normal limits  URINE MICROSCOPIC-ADD ON    Imaging Review No results found.   EKG Interpretation None  MDM   Final diagnoses:  Abscess of axilla  Hyperglycemia    I easily expressed moderate amount of purulent drainage which resolved to bloody drainage from posterior R axilla abscess.   Patient declines I&D at this time.  He is well appearing, VSS.    Labs intrepreted by me, A gap is 12.  No DKA.  Patient reports taking his diabetes medication this morning, but also states that he ate two pieces of jeely toast this morning.  I have advised pt of important of diet modifications and lifestyle changes, he agrees to close f/u with his PMD regarding his blood sugar control  Daeja Helderman L. Vanessa Frederick, PA-C 09/30/13 [phone]

## 2013-09-30 NOTE — ED Notes (Signed)
Pt presents with abscess-like area to right axillary. Pt states he first noticed area 2 days ago. Pt "popped" the abscess this morning and reports malodorous drainage. Pt states he is here for abx.

## 2013-09-30 NOTE — Discharge Instructions (Signed)
Abscess An abscess (boil or furuncle) is an infected area on or under the skin. This area is filled with yellowish-white fluid (pus) and other material (debris). HOME CARE   Only take medicines as told by your doctor.  If you were given antibiotic medicine, take it as directed. Finish the medicine even if you start to feel better.  If gauze is used, follow your doctor's directions for changing the gauze.  To avoid spreading the infection:  Keep your abscess covered with a bandage.  Wash your hands well.  Do not share personal care items, towels, or whirlpools with others.  Avoid skin contact with others.  Keep your skin and clothes clean around the abscess.  Keep all doctor visits as told. GET HELP RIGHT AWAY IF:   You have more pain, puffiness (swelling), or redness in the wound site.  You have more fluid or blood coming from the wound site.  You have muscle aches, chills, or you feel sick.  You have a fever. MAKE SURE YOU:   Understand these instructions.  Will watch your condition.  Will get help right away if you are not doing well or get worse. Document Released: 11/22/2007 Document Revised: 12/05/2011 Document Reviewed: 08/18/2011 [facility] Patient Information 2014 [place].  High Blood Sugar High blood sugar (hyperglycemia) means that the level of sugar in your blood is higher than it should be. Signs of high blood sugar include:  Feeling thirsty.  Frequent peeing (urinating).  Feeling tired or sleepy.  Dry mouth.  Vision changes.  Feeling weak.  Feeling hungry but losing weight.  Numbness and tingling in your hands or feet.  Headache. When you ignore these signs, your blood sugar may keep going up. These problems may get worse, and other problems may begin. HOME CARE  Check your blood sugars as told by your doctor. Write down the numbers with the date and time.  Take the right amount of insulin or diabetes pills at the right time. Write  down the dose with date and time.  Refill your insulin or diabetes pills before running out.  Watch what you eat. Follow your meal plan.  Drink liquids without sugar, such as water. Check with your doctor if you have kidney or heart disease.  Follow your doctor's orders for exercise. Exercise at the same time of day.  Keep your doctor's appointments. GET HELP RIGHT AWAY IF:   You have trouble thinking or are confused.  You have fast breathing with fruity smelling breath.  You pass out (faint).  You have 2 to 3 days of high blood sugars and you do not know why.  You have chest pain.  You are feeling sick to your stomach (nauseous) or throwing up (vomiting).  You have sudden vision changes. MAKE SURE YOU:   Understand these instructions.  Will watch your condition.  Will get help right away if you are not doing well or get worse. Document Released: 04/02/2009 Document Revised: 08/28/2011 Document Reviewed: 04/02/2009 [facility] Patient Information 2014 [place], [place].

## 2013-09-30 NOTE — ED Notes (Addendum)
Patient c/o abscess under right armx2-3 days. Per patient started draining this morning. Patient also reports having 2 abscess in groin a week ago but states "They went away on there on." Patient also c/o burning with urination with strong odor to urine and frequency.

## 2013-09-30 NOTE — ED Provider Notes (Signed)
Medical screening examination/treatment/procedure(s) were performed by non-physician practitioner and as supervising physician I was immediately available for consultation/collaboration.   EKG Interpretation None       Nat Christen, MD 09/30/13 [phone]

## 2013-10-23 ENCOUNTER — Other Ambulatory Visit: Payer: Self-pay | Admitting: Internal Medicine

## 2013-10-30 ENCOUNTER — Inpatient Hospital Stay (HOSPITAL_COMMUNITY)
Admission: EM | Admit: 2013-10-30 | Discharge: 2013-11-02 | DRG: 193 | Disposition: A | Discharge status: Home / Self Care | Payer: Medicaid Other | Attending: Internal Medicine | Admitting: Internal Medicine

## 2013-10-30 ENCOUNTER — Emergency Department (HOSPITAL_COMMUNITY): Payer: Medicaid Other

## 2013-10-30 ENCOUNTER — Other Ambulatory Visit: Payer: Self-pay

## 2013-10-30 ENCOUNTER — Encounter (HOSPITAL_COMMUNITY): Payer: Self-pay | Admitting: Emergency Medicine

## 2013-10-30 ENCOUNTER — Telehealth: Payer: Self-pay | Admitting: Internal Medicine

## 2013-10-30 DIAGNOSIS — I509 Heart failure, unspecified: Secondary | ICD-10-CM | POA: Diagnosis present

## 2013-10-30 DIAGNOSIS — Z833 Family history of diabetes mellitus: Secondary | ICD-10-CM

## 2013-10-30 DIAGNOSIS — Z9884 Bariatric surgery status: Secondary | ICD-10-CM

## 2013-10-30 DIAGNOSIS — Z87891 Personal history of nicotine dependence: Secondary | ICD-10-CM

## 2013-10-30 DIAGNOSIS — I5033 Acute on chronic diastolic (congestive) heart failure: Secondary | ICD-10-CM | POA: Diagnosis present

## 2013-10-30 DIAGNOSIS — J189 Pneumonia, unspecified organism: Principal | ICD-10-CM

## 2013-10-30 DIAGNOSIS — G8929 Other chronic pain: Secondary | ICD-10-CM | POA: Diagnosis present

## 2013-10-30 DIAGNOSIS — A419 Sepsis, unspecified organism: Secondary | ICD-10-CM

## 2013-10-30 DIAGNOSIS — Z6841 Body Mass Index (BMI) 40.0 and over, adult: Secondary | ICD-10-CM

## 2013-10-30 DIAGNOSIS — G4733 Obstructive sleep apnea (adult) (pediatric): Secondary | ICD-10-CM

## 2013-10-30 DIAGNOSIS — Z8249 Family history of ischemic heart disease and other diseases of the circulatory system: Secondary | ICD-10-CM

## 2013-10-30 DIAGNOSIS — Z794 Long term (current) use of insulin: Secondary | ICD-10-CM

## 2013-10-30 DIAGNOSIS — I10 Essential (primary) hypertension: Secondary | ICD-10-CM | POA: Diagnosis present

## 2013-10-30 DIAGNOSIS — Z7982 Long term (current) use of aspirin: Secondary | ICD-10-CM

## 2013-10-30 DIAGNOSIS — J45909 Unspecified asthma, uncomplicated: Secondary | ICD-10-CM | POA: Diagnosis present

## 2013-10-30 DIAGNOSIS — E119 Type 2 diabetes mellitus without complications: Secondary | ICD-10-CM | POA: Diagnosis present

## 2013-10-30 DIAGNOSIS — R0600 Dyspnea, unspecified: Secondary | ICD-10-CM

## 2013-10-30 DIAGNOSIS — Z9981 Dependence on supplemental oxygen: Secondary | ICD-10-CM

## 2013-10-30 DIAGNOSIS — J9600 Acute respiratory failure, unspecified whether with hypoxia or hypercapnia: Secondary | ICD-10-CM | POA: Diagnosis present

## 2013-10-30 DIAGNOSIS — Z800 Family history of malignant neoplasm of digestive organs: Secondary | ICD-10-CM

## 2013-10-30 DIAGNOSIS — M549 Dorsalgia, unspecified: Secondary | ICD-10-CM | POA: Diagnosis present

## 2013-10-30 LAB — URINALYSIS, ROUTINE W REFLEX MICROSCOPIC
Bilirubin Urine: NEGATIVE
GLUCOSE, UA: NEGATIVE mg/dL
Ketones, ur: NEGATIVE mg/dL
Leukocytes, UA: NEGATIVE
Nitrite: NEGATIVE
Protein, ur: NEGATIVE mg/dL
Specific Gravity, Urine: 1.025 (ref 1.005–1.030)
UROBILINOGEN UA: 0.2 mg/dL (ref 0.0–1.0)
pH: 5.5 (ref 5.0–8.0)

## 2013-10-30 LAB — CBC WITH DIFFERENTIAL/PLATELET
BASOS PCT: 0 % (ref 0–1)
Basophils Absolute: 0 10*3/uL (ref 0.0–0.1)
EOS PCT: 2 % (ref 0–5)
Eosinophils Absolute: 0.1 10*3/uL (ref 0.0–0.7)
HEMATOCRIT: 45 % (ref 39.0–52.0)
Hemoglobin: 14.2 g/dL (ref 13.0–17.0)
Lymphocytes Relative: 32 % (ref 12–46)
Lymphs Abs: 2.6 10*3/uL (ref 0.7–4.0)
MCH: 27.7 pg (ref 26.0–34.0)
MCHC: 31.6 g/dL (ref 30.0–36.0)
MCV: 87.7 fL (ref 78.0–100.0)
MONO ABS: 0.4 10*3/uL (ref 0.1–1.0)
Monocytes Relative: 6 % (ref 3–12)
Neutro Abs: 4.9 10*3/uL (ref 1.7–7.7)
Neutrophils Relative %: 60 % (ref 43–77)
Platelets: 301 10*3/uL (ref 150–400)
RBC: 5.13 MIL/uL (ref 4.22–5.81)
RDW: 13.8 % (ref 11.5–15.5)
WBC: 8 10*3/uL (ref 4.0–10.5)

## 2013-10-30 LAB — I-STAT CG4 LACTIC ACID, ED: Lactic Acid, Venous: 1.35 mmol/L (ref 0.5–2.2)

## 2013-10-30 LAB — URINE MICROSCOPIC-ADD ON

## 2013-10-30 MED ORDER — ACETAMINOPHEN 325 MG PO TABS
650.0000 mg | ORAL_TABLET | Freq: Once | ORAL | Status: AC
  Administered 2013-10-30: 650 mg via ORAL
  Filled 2013-10-30: qty 2

## 2013-10-30 MED ORDER — DEXTROSE 5 % IV SOLN
1.0000 g | Freq: Once | INTRAVENOUS | Status: AC
  Administered 2013-10-30: 1 g via INTRAVENOUS
  Filled 2013-10-30: qty 10

## 2013-10-30 MED ORDER — DEXTROSE 5 % IV SOLN
500.0000 mg | Freq: Once | INTRAVENOUS | Status: AC
  Administered 2013-10-31: 500 mg via INTRAVENOUS

## 2013-10-30 MED ORDER — SODIUM CHLORIDE 0.9 % IV SOLN
1000.0000 mL | Freq: Once | INTRAVENOUS | Status: AC
  Administered 2013-10-31: 1000 mL via INTRAVENOUS

## 2013-10-30 MED ORDER — SODIUM CHLORIDE 0.9 % IV SOLN
1000.0000 mL | Freq: Once | INTRAVENOUS | Status: AC
  Administered 2013-10-30: 1000 mL via INTRAVENOUS

## 2013-10-30 MED ORDER — SODIUM CHLORIDE 0.9 % IV SOLN
1000.0000 mL | INTRAVENOUS | Status: DC
  Administered 2013-10-31: 1000 mL via INTRAVENOUS

## 2013-10-30 NOTE — Telephone Encounter (Signed)
Called spoke w/ spouse. Pt is needing RX for reflux. Pt has not seen MW since 2013. She is aware he will need OV. She is wanting pt to be worked in with MW tomorrow early [place]. Please advise MW if okay? thanks

## 2013-10-30 NOTE — Telephone Encounter (Signed)
That's fine - ok to add on and she should bring all active meds with her including otcs

## 2013-10-30 NOTE — Telephone Encounter (Signed)
Spoke with spouse given appt time and date.  Nothing else needed.

## 2013-10-30 NOTE — ED Notes (Signed)
Patient was to have gastric bypass yesterday; when surgeon made incision and saw enlarged liver and low O2 sats, closed patient back up.  Patient states he has been hot and had cold chills today.  Patient hasn't taken anything for fever.

## 2013-10-30 NOTE — ED Provider Notes (Signed)
CSN: [id number]     Arrival date & time 10/30/13  2232 History  This chart was scribed for Sharyon Cable, MD by Rolanda Lundborg, ED Scribe. This patient was seen in room APA05/APA05 and the patient's care was started at 11:03 PM.   Chief Complaint  Patient presents with  . Fever   Patient is a 43 y.o. male presenting with fever. The history is provided by the patient. No language interpreter was used.  Fever Severity:  Moderate Onset quality:  Gradual Duration:  12 hours Timing:  Constant Progression:  Unchanged Relieved by:  None tried Worsened by:  Nothing tried Ineffective treatments:  None tried Associated symptoms: chills, dysuria, headaches and nausea   Associated symptoms: no chest pain, no cough and no vomiting   Headaches:    Severity:  Moderate  HPI Comments: Rhyder Koegel is a 43 y.o. male who presents to the Emergency Department complaining of constant moderate fever with associated chills, dysuria, dizziness, and headache onset today. States dizziness is bothering him the most. Pt states he went to have gastric bypass at [facility], Psychologist, sport and exercise saw enlarged liver and low O2 sats so closed him back up. He was discharged home this morning and the fever started afterward. He reports nausea but no vomiting. He reports mild SOB. He is on oxygen at home starting today. He denies CP or cough. He reports abdominal bloating but no pain. He denies bed sores. He has not been hospitalized in the last few months.     Past Medical History  Diagnosis Date  . Hypertension   . Diabetes mellitus   . Abscess   . Chronic back pain   . Sciatica   . Asthma   . Bronchitis   . OSA (obstructive sleep apnea)   . Sleep apnea     uses cpap  . Headache   . Chronic pain    Past Surgical History  Procedure Laterality Date  . No past surgeries     Family History  Problem Relation Age of Onset  . Hypertension Mother   . Diabetes Mother   . Hypertension Father   . Diabetes Father   .  Diabetes Brother   . Hypertension Brother   . Colon cancer Sister    History  Substance Use Topics  . Smoking status: Former Smoker -- 0.50 packs/day for 24 years    Types: Cigarettes    Start date: 06/20/1987    Quit date: 12/18/2011  . Smokeless tobacco: Never Used  . Alcohol Use: No    Review of Systems  Constitutional: Positive for fever and chills.  Respiratory: Positive for shortness of breath. Negative for cough.   Cardiovascular: Negative for chest pain.  Gastrointestinal: Positive for nausea. Negative for vomiting and abdominal pain.  Genitourinary: Positive for dysuria.  Neurological: Positive for dizziness and headaches.      Allergies  Sulfa antibiotics  Home Medications   Prior to Admission medications   Medication Sig Start Date End Date Taking? Authorizing Provider  albuterol-ipratropium (COMBIVENT) 18-103 MCG/ACT inhaler Inhale 2 puffs into the lungs every 6 (six) hours as needed. For shortness of breath    Historical Provider, MD  amLODipine (NORVASC) 10 MG tablet Take 10 mg by mouth daily.      Historical Provider, MD  aspirin EC 325 MG tablet Take 325 mg by mouth daily.    Historical Provider, MD  budesonide-formoterol (SYMBICORT) 160-4.5 MCG/ACT inhaler Inhale 2 puffs into the lungs 2 (two) times daily as needed (shortness  of breath).    Historical Provider, MD  carvedilol (COREG) 12.5 MG tablet Take 12.5 mg by mouth 2 (two) times daily with a meal.    Historical Provider, MD  Choline Fenofibrate (TRILIPIX) 135 MG capsule Take 135 mg by mouth daily.    Historical Provider, MD  cyanocobalamin (,VITAMIN B-12,) 1000 MCG/ML injection Inject 1,000 mcg into the muscle every 30 (thirty) days.    Historical Provider, MD  doxycycline (VIBRAMYCIN) 100 MG capsule Take 1 capsule (100 mg total) by mouth 2 (two) times daily. For 14 days 09/30/13   Tammy L. Triplett, PA-C  Fluticasone-Salmeterol (ADVAIR) 250-50 MCG/DOSE AEPB Inhale 1 puff into the lungs 2 (two) times daily  as needed (shortness of breath).     Historical Provider, MD  hydrochlorothiazide 25 MG tablet Take 25 mg by mouth daily.     Historical Provider, MD  ibuprofen (ADVIL,MOTRIN) 200 MG tablet Take 400 mg by mouth every 6 (six) hours as needed for headache.    Historical Provider, MD  insulin aspart (NOVOLOG) 100 UNIT/ML injection Inject 35 Units into the skin 3 (three) times daily with meals.     Historical Provider, MD  insulin glargine (LANTUS SOLOSTAR) 100 UNIT/ML injection Inject 110 Units into the skin at bedtime.     Historical Provider, MD  metFORMIN (GLUCOPHAGE) 1000 MG tablet Take 1,000 mg by mouth 2 (two) times daily with a meal.      Historical Provider, MD  omeprazole (PRILOSEC) 20 MG capsule Take 1 capsule (20 mg total) by mouth daily. 07/15/13   Tanda Rockers, MD  oxyCODONE-acetaminophen (PERCOCET/ROXICET) 5-325 MG per tablet Take 1 tablet by mouth every 4 (four) hours as needed for severe pain. 09/30/13   Tammy L. Triplett, PA-C  pravastatin (PRAVACHOL) 20 MG tablet Take 20 mg by mouth daily.    Historical Provider, MD   BP 152/82  Pulse 100  Temp(Src) 102 F (38.9 C) (Oral)  Resp 18  Ht 6\' 1"  (1.854 m)  Wt 418 lb (189.604 kg)  BMI 55.16 kg/m2  SpO2 84% Physical Exam CONSTITUTIONAL: Well developed/well nourished HEAD: Normocephalic/atraumatic EYES: EOMI/PERRL ENMT: Mucous membranes moist NECK: supple no meningeal signs SPINE:entire spine nontender CV: S1/S2 noted, no murmurs/rubs/gallops noted LUNGS: decreased breath sounds bilaterally ABDOMEN: soft, nontender, no rebound or guarding. Obese, well healing incisions noted from laparoscopy. GU:no cva tenderness NEURO: Pt is awake/alert, moves all extremitiesx4. No facial droop no arm or leg drift. EXTREMITIES: pulses normal, full ROM SKIN: warm, color normal PSYCH: no abnormalities of mood noted  ED Course  Procedures  Medications  0.9 %  sodium chloride infusion (0 mLs Intravenous Stopped 10/31/13 0043)    Followed by   0.9 %  sodium chloride infusion (0 mLs Intravenous Hold 10/31/13 0043)    Followed by  0.9 %  sodium chloride infusion (not administered)  azithromycin (ZITHROMAX) 500 mg in dextrose 5 % 250 mL IVPB (500 mg Intravenous New Bag/Given 10/31/13 0034)  cefTRIAXone (ROCEPHIN) 1 g in dextrose 5 % 50 mL IVPB (0 g Intravenous Stopped 10/31/13 0035)  acetaminophen (TYLENOL) tablet 650 mg (650 mg Oral Given 10/30/13 2338)    DIAGNOSTIC STUDIES: Oxygen Saturation is 84% on RA, low by my interpretation.    COORDINATION OF CARE: 11:12 PM- Discussed treatment plan with pt. Pt agrees to plan.  1:09 AM Pt has been stabilized He reports increased SOB and also increased O2 requirement He was intubated for surgery (he had planned gastric bypass that was aborted) so it is possible  he had aspiration.   His abdomen is soft and I doubt acute abd process or postop complication I doubt meningitis Will admit for observation D/w dr Marin Comment with [facility] will admit  Labs Review Labs Reviewed  COMPREHENSIVE METABOLIC PANEL - Abnormal; Notable for the following:    Glucose, Bld 183 (*)    AST 96 (*)    ALT 75 (*)    GFR calc non Af Amer 84 (*)    All other components within normal limits  URINALYSIS, ROUTINE W REFLEX MICROSCOPIC - Abnormal; Notable for the following:    Hgb urine dipstick TRACE (*)    All other components within normal limits  CULTURE, BLOOD (ROUTINE X 2)  CULTURE, BLOOD (ROUTINE X 2)  URINE CULTURE  CBC WITH DIFFERENTIAL  URINE MICROSCOPIC-ADD ON  I-STAT CG4 LACTIC ACID, ED    Imaging Review Dg Chest Port 1 View  10/30/2013   CLINICAL DATA:  Fever, recent surgery  EXAM: PORTABLE CHEST - 1 VIEW  COMPARISON:  07/10/2013  FINDINGS: Cardiac shadow is within normal limits. The lungs are well aerated bilaterally. Minimal scarring is noted in the left lung base. Mild thickening of the minor fissure is noted which may be related to a small amount of pleural fluid. Mild vascular congestion is noted as  well.  IMPRESSION: Mild vascular congestion and sign suggestive of mild pleural fluid.   Electronically Signed   By: Inez Catalina M.D.   On: 10/30/2013 23:30     Date: 10/30/2013  Rate: 97  Rhythm: normal sinus rhythm  QRS Axis: left  Intervals: normal  ST/T Wave abnormalities: nonspecific ST changes  Conduction Disutrbances:none    MDM   Final diagnoses:  Sepsis    Nursing notes including past medical history and social history reviewed and considered in documentation. xrays reviewed and considered Labs/vital reviewed and considered Previous records reviewed and considered   I personally performed the services described in this documentation, which was scribed in my presence. The recorded information has been reviewed and is accurate.      Sharyon Cable, MD 10/31/13 0111

## 2013-10-30 NOTE — Telephone Encounter (Signed)
[id number] x1 

## 2013-10-31 ENCOUNTER — Encounter (HOSPITAL_COMMUNITY): Payer: Self-pay | Admitting: Internal Medicine

## 2013-10-31 ENCOUNTER — Ambulatory Visit: Payer: Medicaid Other | Admitting: Internal Medicine

## 2013-10-31 ENCOUNTER — Inpatient Hospital Stay (HOSPITAL_COMMUNITY): Payer: Medicaid Other

## 2013-10-31 ENCOUNTER — Observation Stay (HOSPITAL_COMMUNITY): Payer: Medicaid Other

## 2013-10-31 DIAGNOSIS — J189 Pneumonia, unspecified organism: Secondary | ICD-10-CM | POA: Diagnosis present

## 2013-10-31 DIAGNOSIS — A419 Sepsis, unspecified organism: Secondary | ICD-10-CM

## 2013-10-31 DIAGNOSIS — R0609 Other forms of dyspnea: Secondary | ICD-10-CM

## 2013-10-31 DIAGNOSIS — R0989 Other specified symptoms and signs involving the circulatory and respiratory systems: Secondary | ICD-10-CM

## 2013-10-31 DIAGNOSIS — G4733 Obstructive sleep apnea (adult) (pediatric): Secondary | ICD-10-CM

## 2013-10-31 DIAGNOSIS — I517 Cardiomegaly: Secondary | ICD-10-CM

## 2013-10-31 LAB — GLUCOSE, CAPILLARY
Glucose-Capillary: 180 mg/dL — ABNORMAL HIGH (ref 70–99)
Glucose-Capillary: 199 mg/dL — ABNORMAL HIGH (ref 70–99)
Glucose-Capillary: 155 mg/dL — ABNORMAL HIGH (ref 70–99)
Glucose-Capillary: 216 mg/dL — ABNORMAL HIGH (ref 70–99)

## 2013-10-31 LAB — COMPREHENSIVE METABOLIC PANEL
ALT: 75 U/L — ABNORMAL HIGH (ref 0–53)
AST: 96 U/L — ABNORMAL HIGH (ref 0–37)
Albumin: 3.6 g/dL (ref 3.5–5.2)
Alkaline Phosphatase: 57 U/L (ref 39–117)
BUN: 21 mg/dL (ref 6–23)
CALCIUM: 9.6 mg/dL (ref 8.4–10.5)
CO2: 28 mEq/L (ref 19–32)
CREATININE: 1.07 mg/dL (ref 0.50–1.35)
Chloride: 98 mEq/L (ref 96–112)
GFR calc Af Amer: >90 mL/min (ref >90)
GFR, EST NON AFRICAN AMERICAN: 84 mL/min — AB (ref >90)
Glucose, Bld: 183 mg/dL — ABNORMAL HIGH (ref 70–99)
Potassium: 3.9 mEq/L (ref 3.7–5.3)
SODIUM: 137 meq/L (ref 137–147)
Total Bilirubin: 0.6 mg/dL (ref 0.3–1.2)
Total Protein: 8.2 g/dL (ref 6.0–8.3)

## 2013-10-31 LAB — PRO B NATRIURETIC PEPTIDE: Pro B Natriuretic peptide (BNP): 51.4 pg/mL (ref 0–125)

## 2013-10-31 LAB — CBG MONITORING, ED: Glucose-Capillary: 249 mg/dL — ABNORMAL HIGH (ref 70–99)

## 2013-10-31 MED ORDER — VANCOMYCIN HCL 10 G IV SOLR
1500.0000 mg | Freq: Two times a day (BID) | INTRAVENOUS | Status: DC
  Administered 2013-10-31 – 2013-11-01 (×3): 1500 mg via INTRAVENOUS
  Filled 2013-10-31 (×6): qty 1500

## 2013-10-31 MED ORDER — MECLIZINE HCL 12.5 MG PO TABS
12.5000 mg | ORAL_TABLET | Freq: Three times a day (TID) | ORAL | Status: DC | PRN
  Administered 2013-10-31 – 2013-11-02 (×4): 12.5 mg via ORAL
  Filled 2013-10-31 (×4): qty 1

## 2013-10-31 MED ORDER — SODIUM CHLORIDE 0.9 % IV SOLN: 1000.0000 mL | INTRAVENOUS | Status: DC

## 2013-10-31 MED ORDER — HYDROCHLOROTHIAZIDE 25 MG PO TABS
25.0000 mg | ORAL_TABLET | Freq: Every day | ORAL | Status: DC
  Administered 2013-10-31: 25 mg via ORAL
  Filled 2013-10-31: qty 1

## 2013-10-31 MED ORDER — BUDESONIDE-FORMOTEROL FUMARATE 160-4.5 MCG/ACT IN AERO: 2.0000 | INHALATION_SPRAY | Freq: Two times a day (BID) | RESPIRATORY_TRACT | Status: DC | PRN

## 2013-10-31 MED ORDER — METRONIDAZOLE 500 MG PO TABS
500.0000 mg | ORAL_TABLET | Freq: Three times a day (TID) | ORAL | Status: DC
  Administered 2013-10-31: 500 mg via ORAL
  Filled 2013-10-31: qty 1

## 2013-10-31 MED ORDER — SIMVASTATIN 10 MG PO TABS: 10.0000 mg | ORAL_TABLET | Freq: Every day | ORAL | Status: DC

## 2013-10-31 MED ORDER — CYANOCOBALAMIN 1000 MCG/ML IJ SOLN: 1000.0000 ug | INTRAMUSCULAR | Status: DC

## 2013-10-31 MED ORDER — IPRATROPIUM-ALBUTEROL 0.5-2.5 (3) MG/3ML IN SOLN
3.0000 mL | Freq: Four times a day (QID) | RESPIRATORY_TRACT | Status: DC
  Administered 2013-10-31 – 2013-11-02 (×8): 3 mL via RESPIRATORY_TRACT
  Filled 2013-10-31 (×8): qty 3

## 2013-10-31 MED ORDER — MOMETASONE FURO-FORMOTEROL FUM 200-5 MCG/ACT IN AERO
2.0000 | INHALATION_SPRAY | Freq: Two times a day (BID) | RESPIRATORY_TRACT | Status: DC
  Administered 2013-10-31 – 2013-11-02 (×5): 2 via RESPIRATORY_TRACT
  Filled 2013-10-31: qty 8.8

## 2013-10-31 MED ORDER — PANTOPRAZOLE SODIUM 40 MG PO TBEC
40.0000 mg | DELAYED_RELEASE_TABLET | Freq: Every day | ORAL | Status: DC
  Administered 2013-10-31 – 2013-11-02 (×3): 40 mg via ORAL
  Filled 2013-10-31 (×3): qty 1

## 2013-10-31 MED ORDER — IPRATROPIUM-ALBUTEROL 18-103 MCG/ACT IN AERO: 2.0000 | INHALATION_SPRAY | Freq: Four times a day (QID) | RESPIRATORY_TRACT | Status: DC | PRN

## 2013-10-31 MED ORDER — INSULIN ASPART 100 UNIT/ML ~~LOC~~ SOLN
0.0000 [IU] | Freq: Every day | SUBCUTANEOUS | Status: DC
  Administered 2013-10-31 – 2013-11-01 (×3): 2 [IU] via SUBCUTANEOUS
  Filled 2013-10-31: qty 1

## 2013-10-31 MED ORDER — ACETAMINOPHEN 325 MG PO TABS: 650.0000 mg | ORAL_TABLET | Freq: Four times a day (QID) | ORAL | Status: DC | PRN

## 2013-10-31 MED ORDER — PIPERACILLIN-TAZOBACTAM 3.375 G IVPB
3.3750 g | Freq: Three times a day (TID) | INTRAVENOUS | Status: DC
  Administered 2013-10-31 – 2013-11-02 (×6): 3.375 g via INTRAVENOUS
  Filled 2013-10-31 (×10): qty 50

## 2013-10-31 MED ORDER — MOMETASONE FURO-FORMOTEROL FUM 100-5 MCG/ACT IN AERO
2.0000 | INHALATION_SPRAY | Freq: Two times a day (BID) | RESPIRATORY_TRACT | Status: DC
  Filled 2013-10-31: qty 8.8

## 2013-10-31 MED ORDER — FENOFIBRATE 54 MG PO TABS
54.0000 mg | ORAL_TABLET | Freq: Every day | ORAL | Status: DC
  Administered 2013-11-01: 54 mg via ORAL
  Filled 2013-10-31 (×4): qty 1

## 2013-10-31 MED ORDER — INSULIN ASPART 100 UNIT/ML ~~LOC~~ SOLN
0.0000 [IU] | Freq: Three times a day (TID) | SUBCUTANEOUS | Status: DC
  Administered 2013-10-31 (×3): 4 [IU] via SUBCUTANEOUS
  Administered 2013-11-01: 11 [IU] via SUBCUTANEOUS
  Administered 2013-11-01 (×2): 7 [IU] via SUBCUTANEOUS
  Administered 2013-11-02: 11 [IU] via SUBCUTANEOUS

## 2013-10-31 MED ORDER — IPRATROPIUM-ALBUTEROL 0.5-2.5 (3) MG/3ML IN SOLN: 3.0000 mL | Freq: Four times a day (QID) | RESPIRATORY_TRACT | Status: DC | PRN

## 2013-10-31 MED ORDER — AMLODIPINE BESYLATE 5 MG PO TABS
10.0000 mg | ORAL_TABLET | Freq: Every day | ORAL | Status: DC
  Administered 2013-10-31: 10 mg via ORAL
  Filled 2013-10-31: qty 2

## 2013-10-31 MED ORDER — INSULIN GLARGINE 100 UNIT/ML ~~LOC~~ SOLN
8.0000 [IU] | Freq: Every day | SUBCUTANEOUS | Status: DC
  Administered 2013-10-31 – 2013-11-01 (×3): 8 [IU] via SUBCUTANEOUS
  Filled 2013-10-31 (×4): qty 0.08

## 2013-10-31 MED ORDER — ONDANSETRON HCL 4 MG PO TABS: 4.0000 mg | ORAL_TABLET | Freq: Four times a day (QID) | ORAL | Status: DC | PRN

## 2013-10-31 MED ORDER — METFORMIN HCL 500 MG PO TABS
1000.0000 mg | ORAL_TABLET | Freq: Two times a day (BID) | ORAL | Status: DC
  Administered 2013-10-31: 1000 mg via ORAL
  Filled 2013-10-31: qty 2

## 2013-10-31 MED ORDER — VANCOMYCIN HCL 10 G IV SOLR
1500.0000 mg | INTRAVENOUS | Status: DC
  Filled 2013-10-31 (×3): qty 1500

## 2013-10-31 MED ORDER — FUROSEMIDE 10 MG/ML IJ SOLN
20.0000 mg | Freq: Once | INTRAMUSCULAR | Status: AC
  Administered 2013-10-31: 20 mg via INTRAVENOUS
  Filled 2013-10-31: qty 2

## 2013-10-31 MED ORDER — ASPIRIN EC 325 MG PO TBEC
325.0000 mg | DELAYED_RELEASE_TABLET | Freq: Every day | ORAL | Status: DC
  Administered 2013-10-31 – 2013-11-02 (×3): 325 mg via ORAL
  Filled 2013-10-31 (×3): qty 1

## 2013-10-31 MED ORDER — ONDANSETRON HCL 4 MG/2ML IJ SOLN: 4.0000 mg | Freq: Four times a day (QID) | INTRAMUSCULAR | Status: DC | PRN

## 2013-10-31 MED ORDER — POTASSIUM CHLORIDE CRYS ER 20 MEQ PO TBCR
40.0000 meq | EXTENDED_RELEASE_TABLET | Freq: Once | ORAL | Status: AC
  Administered 2013-10-31: 40 meq via ORAL
  Filled 2013-10-31: qty 2

## 2013-10-31 MED ORDER — LEVOFLOXACIN IN D5W 750 MG/150ML IV SOLN
750.0000 mg | Freq: Every day | INTRAVENOUS | Status: DC
  Administered 2013-10-31: 750 mg via INTRAVENOUS
  Filled 2013-10-31: qty 150

## 2013-10-31 MED ORDER — HEPARIN SODIUM (PORCINE) 5000 UNIT/ML IJ SOLN
5000.0000 [IU] | Freq: Three times a day (TID) | INTRAMUSCULAR | Status: DC
  Administered 2013-10-31 – 2013-11-02 (×7): 5000 [IU] via SUBCUTANEOUS
  Filled 2013-10-31 (×7): qty 1

## 2013-10-31 MED ORDER — VANCOMYCIN HCL 10 G IV SOLR
2000.0000 mg | Freq: Once | INTRAVENOUS | Status: AC
  Administered 2013-10-31: 2000 mg via INTRAVENOUS
  Filled 2013-10-31: qty 2000

## 2013-10-31 MED ORDER — CARVEDILOL 12.5 MG PO TABS
12.5000 mg | ORAL_TABLET | Freq: Two times a day (BID) | ORAL | Status: DC
  Administered 2013-10-31 – 2013-11-02 (×5): 12.5 mg via ORAL
  Filled 2013-10-31 (×5): qty 1

## 2013-10-31 MED ORDER — OXYCODONE-ACETAMINOPHEN 5-325 MG PO TABS
1.0000 | ORAL_TABLET | ORAL | Status: DC | PRN
  Administered 2013-10-31 – 2013-11-02 (×12): 1 via ORAL
  Filled 2013-10-31 (×12): qty 1

## 2013-10-31 NOTE — Progress Notes (Signed)
ANTIBIOTIC CONSULT NOTE - INITIAL  Pharmacy Consult for Vancomycin & Zosyn Indication: pneumonia  Allergies  Allergen Reactions  . Sulfa Antibiotics Rash    Patient Measurements: Height: 6\' 1"  (185.4 cm) Weight: 418 lb (189.604 kg) IBW/kg (Calculated) : 79.9  Vital Signs: Temp: 98 F (36.7 C) (05/15 0700) Temp src: Oral (05/15 0700) BP: 125/77 mmHg (05/15 0700) Pulse Rate: 93 (05/15 0700) Intake/Output from previous day:   Intake/Output from this shift:    Labs:  Recent Labs  10/30/13 2312  WBC 8.0  HGB 14.2  PLT 301  CREATININE 1.07   Estimated Creatinine Clearance: 157.5 ml/min (by C-G formula based on Cr of 1.07). No results found for this basename: VANCOTROUGH, VANCOPEAK, VANCORANDOM, GENTTROUGH, GENTPEAK, GENTRANDOM, TOBRATROUGH, TOBRAPEAK, TOBRARND, AMIKACINPEAK, AMIKACINTROU, AMIKACIN,  in the last 72 hours   Microbiology: Recent Results (from the past 720 hour(s))  CULTURE, BLOOD (ROUTINE X 2)     Status: None   Collection Time    10/30/13 11:09 PM      Result Value Ref Range Status   Specimen Description Blood LEFT ARM   Final   Special Requests BOTTLES DRAWN AEROBIC AND ANAEROBIC [place]   Final   Culture PENDING   Incomplete   Report Status PENDING   Incomplete  CULTURE, BLOOD (ROUTINE X 2)     Status: None   Collection Time    10/30/13 11:17 PM      Result Value Ref Range Status   Specimen Description Blood RIGHT ARM   Final   Special Requests BOTTLES DRAWN AEROBIC AND ANAEROBIC 8 CC EACH   Final   Culture PENDING   Incomplete   Report Status PENDING   Incomplete    Medical History: Past Medical History  Diagnosis Date  . Hypertension   . Diabetes mellitus   . Abscess   . Chronic back pain   . Sciatica   . Asthma   . Bronchitis   . OSA (obstructive sleep apnea)   . Sleep apnea     uses cpap  . Headache   . Chronic pain     Medications:  Scheduled:  . aspirin EC  325 mg Oral Daily  . carvedilol  12.5 mg Oral BID WC  . [START  ON 11/28/2013] cyanocobalamin  1,000 mcg Intramuscular Q30 days  . fenofibrate  54 mg Oral Daily  . heparin  5,000 Units Subcutaneous 3 times per day  . insulin aspart  0-20 Units Subcutaneous TID WC  . insulin aspart  0-5 Units Subcutaneous QHS  . insulin glargine  8 Units Subcutaneous QHS  . ipratropium-albuterol  3 mL Nebulization Q6H  . mometasone-formoterol  2 puff Inhalation BID  . pantoprazole  40 mg Oral Daily  . piperacillin-tazobactam (ZOSYN)  IV  3.375 g Intravenous Q8H  . vancomycin  2,000 mg Intravenous Once   Assessment: 43 yo OBESE male who was discharged from [facility] on 5/13 after unable to complete planned bariatric surgery.  He presented to [facility] ED with fever & hypoxia.  Currently on empiric, broad-spectrum antibiotics for possible aspiration vs. Health-care associated PNA.   Patient is currently afebrile with normal WBC & lactic acid level.   Renal function is at patient's baseline.   Vancomycin 5/15>> Zosyn 5/15>> Levaquin 5/15>>5/15 Rocephin 5/14>>5/15 Zithromax 5/14>>5/15  Goal of Therapy:  Vancomycin trough level 15-20 mcg/ml  Plan:  Zosyn 3.375gm IV Q8h to be infused over 4hrs Vancomycin 2gm IV load then 1500mg  IV q12h Check Vancomycin trough at steady state  Monitor renal function and cx data   Lavonia Drafts Mirko Tailor 10/31/2013,11:28 AM

## 2013-10-31 NOTE — ED Notes (Signed)
Called report to Lisa,RN stated she would need to call back. Informed Lattie Haw, RN that pt. Would need bariatric bed.

## 2013-10-31 NOTE — Progress Notes (Signed)
*  PRELIMINARY RESULTS* Echocardiogram 2D Echocardiogram has been performed.  Shawn Newman 10/31/2013, 2:50 PM

## 2013-10-31 NOTE — Progress Notes (Signed)
UR completed. Patient changed to inpatient- requiring IV antibiotics and IVF

## 2013-10-31 NOTE — H&P (Signed)
[facility] History and Physical  Shawn Newman XNA:[id number] DOB: Oct 26, 1970    PCP:   PROVIDER NOT IN SYSTEM   Chief Complaint: HA and coughs.  Having fever.  HPI: Shawn Newman is an 43 y.o. male with hx DM2, on Insulin, morbid obesity, sleep apnea on CPAP, asthma, presents to the ER wish subjective fever and coughs today.  Yesterday, he was at [facility] for a bariatric surgery, but the procedure was terminated, as he couldn't lie down flat.  He had oxygen desaturation.  This happened after he was intubated.  Subsequently, he had laparoscopy, and was told his liver was quite large.  He was subsequently discharged on oxygen yesterday.  He felt chills, and has a nonproductive coughs, but no abdominal pain, nausea or vomiting.  He has no black or bloody stool.  Evalaution in the ER showed no leukocytosis, and CXR was clear except for mild vascular congestion and mild pleural effusion.  He was also desat with ambulation.  His temp was found to be 102 in the ER.  Hopsitalist was asked to admit him for possible early PNA.  He was given Rocephin and ZIthromax in the ER.    Rewiew of Systems:  Constitutional: Negative for malaise. No significant weight loss or weight gain Eyes: Negative for eye pain, redness and discharge, diplopia, visual changes, or flashes of light. ENMT: Negative for ear pain, hoarseness, nasal congestion, sinus pressure and sore throat. No headaches; tinnitus, drooling, or problem swallowing. Cardiovascular: Negative for chest pain, palpitations, diaphoresis,  and peripheral edema. ; No orthopnea, PND Respiratory: Negative for  hemoptysis, wheezing and stridor. No pleuritic chestpain. Gastrointestinal: Negative for nausea, vomiting, diarrhea, constipation, abdominal pain, melena, blood in stool, hematemesis, jaundice and rectal bleeding.    Genitourinary: Negative for frequency, dysuria, incontinence,flank pain and hematuria; Musculoskeletal:  Negative for back pain and neck pain. Negative for swelling and trauma.;  Skin: . Negative for pruritus, rash, abrasions, bruising and skin lesion.; ulcerations Neuro: Negative for headache, lightheadedness and neck stiffness. Negative for weakness, altered level of consciousness , altered mental status, extremity weakness, burning feet, involuntary movement, seizure and syncope.  Psych: negative for anxiety, depression, insomnia, tearfulness, panic attacks, hallucinations, paranoia, suicidal or homicidal ideation    Past Medical History  Diagnosis Date  . Hypertension   . Diabetes mellitus   . Abscess   . Chronic back pain   . Sciatica   . Asthma   . Bronchitis   . OSA (obstructive sleep apnea)   . Sleep apnea     uses cpap  . Headache   . Chronic pain     Past Surgical History  Procedure Laterality Date  . No past surgeries      Medications:  HOME MEDS: Prior to Admission medications   Medication Sig Start Date End Date Taking? Authorizing Provider  albuterol-ipratropium (COMBIVENT) 18-103 MCG/ACT inhaler Inhale 2 puffs into the lungs every 6 (six) hours as needed. For shortness of breath    Historical Provider, MD  amLODipine (NORVASC) 10 MG tablet Take 10 mg by mouth daily.      Historical Provider, MD  aspirin EC 325 MG tablet Take 325 mg by mouth daily.    Historical Provider, MD  budesonide-formoterol (SYMBICORT) 160-4.5 MCG/ACT inhaler Inhale 2 puffs into the lungs 2 (two) times daily as needed (shortness of breath).    Historical Provider, MD  carvedilol (COREG) 12.5 MG tablet Take 12.5 mg by mouth 2 (two) times daily with a meal.  Historical Provider, MD  Choline Fenofibrate (TRILIPIX) 135 MG capsule Take 135 mg by mouth daily.    Historical Provider, MD  cyanocobalamin (,VITAMIN B-12,) 1000 MCG/ML injection Inject 1,000 mcg into the muscle every 30 (thirty) days.    Historical Provider, MD  doxycycline (VIBRAMYCIN) 100 MG capsule Take 1 capsule (100 mg total) by  mouth 2 (two) times daily. For 14 days 09/30/13   Tammy L. Triplett, PA-C  Fluticasone-Salmeterol (ADVAIR) 250-50 MCG/DOSE AEPB Inhale 1 puff into the lungs 2 (two) times daily as needed (shortness of breath).     Historical Provider, MD  hydrochlorothiazide 25 MG tablet Take 25 mg by mouth daily.     Historical Provider, MD  ibuprofen (ADVIL,MOTRIN) 200 MG tablet Take 400 mg by mouth every 6 (six) hours as needed for headache.    Historical Provider, MD  insulin aspart (NOVOLOG) 100 UNIT/ML injection Inject 35 Units into the skin 3 (three) times daily with meals.     Historical Provider, MD  insulin glargine (LANTUS SOLOSTAR) 100 UNIT/ML injection Inject 110 Units into the skin at bedtime.     Historical Provider, MD  metFORMIN (GLUCOPHAGE) 1000 MG tablet Take 1,000 mg by mouth 2 (two) times daily with a meal.      Historical Provider, MD  omeprazole (PRILOSEC) 20 MG capsule Take 1 capsule (20 mg total) by mouth daily. 07/15/13   Tanda Rockers, MD  oxyCODONE-acetaminophen (PERCOCET/ROXICET) 5-325 MG per tablet Take 1 tablet by mouth every 4 (four) hours as needed for severe pain. 09/30/13   Tammy L. Triplett, PA-C  pravastatin (PRAVACHOL) 20 MG tablet Take 20 mg by mouth daily.    Historical Provider, MD     Allergies:  Allergies  Allergen Reactions  . Sulfa Antibiotics Rash    Social History:   reports that he quit smoking about 22 months ago. His smoking use included Cigarettes. He started smoking about 26 years ago. He has a 12 pack-year smoking history. He has never used smokeless tobacco. He reports that he does not drink alcohol or use illicit drugs.  Family History: Family History  Problem Relation Age of Onset  . Hypertension Mother   . Diabetes Mother   . Hypertension Father   . Diabetes Father   . Diabetes Brother   . Hypertension Brother   . Colon cancer Sister      Physical Exam: Filed Vitals:   10/30/13 2255 10/30/13 2315 10/30/13 2330 10/31/13 0032  BP: 152/82    128/69  Pulse: 100  102 97  Temp: 102 F (38.9 C)   99.3 F (37.4 C)  TempSrc: Oral   Oral  Resp: 18  20 22   Height: 6\' 1"  (1.854 m)     Weight: 189.604 kg (418 lb)     SpO2: 84% 92% 91% 93%   Blood pressure 128/69, pulse 97, temperature 99.3 F (37.4 C), temperature source Oral, resp. rate 22, height 6\' 1"  (1.854 m), weight 189.604 kg (418 lb), SpO2 93.00%.  GEN:  Pleasant patient lying in the stretcher in no acute distress; cooperative with exam. PSYCH:  alert and oriented x4; does not appear anxious or depressed; affect is appropriate. HEENT: Mucous membranes pink and anicteric; PERRLA; EOM intact; no cervical lymphadenopathy nor thyromegaly or carotid bruit; no JVD; There were no stridor. Neck is very supple. Breasts:: Not examined CHEST WALL: No tenderness CHEST: Normal respiration, no wheezing, but he has crackles on his right mid to lower lung field. HEART: Regular rate and rhythm.  There are no murmur, rub, or gallops.   BACK: No kyphosis or scoliosis; no CVA tenderness ABDOMEN: soft and non-tender; no masses, no organomegaly, normal abdominal bowel sounds; no pannus; no intertriginous candida. There is no rebound and no distention. Rectal Exam: Not done EXTREMITIES: No bone or joint deformity; age-appropriate arthropathy of the hands and knees; no edema; no ulcerations.  There is no calf tenderness. Genitalia: not examined PULSES: 2+ and symmetric SKIN: Normal hydration no rash or ulceration CNS: Cranial nerves 2-12 grossly intact no focal lateralizing neurologic deficit.  Speech is fluent; uvula elevated with phonation, facial symmetry and tongue midline. DTR are normal bilaterally, cerebella exam is intact, barbinski is negative and strengths are equaled bilaterally.  No sensory loss.   Labs on Admission:  Basic Metabolic Panel:  Recent Labs Lab 10/30/13 2312  NA 137  K 3.9  CL 98  CO2 28  GLUCOSE 183*  BUN 21  CREATININE 1.07  CALCIUM 9.6   Liver Function  Tests:  Recent Labs Lab 10/30/13 2312  AST 96*  ALT 75*  ALKPHOS 57  BILITOT 0.6  PROT 8.2  ALBUMIN 3.6   No results found for this basename: LIPASE, AMYLASE,  in the last 168 hours No results found for this basename: AMMONIA,  in the last 168 hours CBC:  Recent Labs Lab 10/30/13 2312  WBC 8.0  NEUTROABS 4.9  HGB 14.2  HCT 45.0  MCV 87.7  PLT 301   Cardiac Enzymes: No results found for this basename: CKTOTAL, CKMB, CKMBINDEX, TROPONINI,  in the last 168 hours  CBG: No results found for this basename: GLUCAP,  in the last 168 hours   Radiological Exams on Admission: Dg Chest Port 1 View  10/30/2013   CLINICAL DATA:  Fever, recent surgery  EXAM: PORTABLE CHEST - 1 VIEW  COMPARISON:  07/10/2013  FINDINGS: Cardiac shadow is within normal limits. The lungs are well aerated bilaterally. Minimal scarring is noted in the left lung base. Mild thickening of the minor fissure is noted which may be related to a small amount of pleural fluid. Mild vascular congestion is noted as well.  IMPRESSION: Mild vascular congestion and sign suggestive of mild pleural fluid.   Electronically Signed   By: Inez Catalina M.D.   On: 10/30/2013 23:30    Assessment/Plan Present on Admission:  . PNA (pneumonia) . Morbid obesity . OSA (obstructive sleep apnea) . Dyspnea . Asthma  PLAN:  Given his fever, and recently intubated, the most likely dx would be aspiration PNA.  He also had intraabdominal procedure, but had no pain, or any GI symptoms, so I don't think he has intraabdominal abscess.  Will Tx him with Levoquin and IV Flagyl.  He will be given oxygen and CPAP at night. He is stable, full code, and will be admitted to [facility] service.  His home meds will be continued.  Other plans as per orders.  Code Status: FULL CODE.   Orvan Falconer, MD. [facility] Pager [phone] 7pm to 7am.  10/31/2013, 1:30 AM

## 2013-10-31 NOTE — Progress Notes (Signed)
Inpatient Diabetes Program Recommendations  AACE/ADA: New Consensus Statement on Inpatient Glycemic Control (2013)  Target Ranges:  Prepandial:   less than 140 mg/dL      Peak postprandial:   less than 180 mg/dL (1-2 hours)      Critically ill patients:  140 - 180 mg/dL   Results for Shawn Newman, Shawn Newman (MRN [id number]) as of 10/31/2013 12:15  Ref. Range 10/31/2013 02:25 10/31/2013 07:23 10/31/2013 11:52  Glucose-Capillary Latest Range: 70-99 mg/dL 249 (H) 180 (H) 199 (H)   Diabetes history: DM2 Outpatient Diabetes medications: Lantus 110 units QHS, Novolog 35 units TID with meals, Metformin 1000 mg BID Current orders for Inpatient glycemic control: Lantus 8 units QHS, Novolog 0-20 units AC, Novolog 0-5 units HS  Note: Noted patient takes large dose of Lantus and Novolog with meals as an outpatient. Talked with patient and his wife to confirm insulin dosages.  Patient is sleepy and his wife is the one providing the majority of information during conversation. According to patient's wife, patient is followed by Eustaquio Maize, NP at [facility] (Endocrinology) for diabetes management. Inquired about diabetes medications and patient's wife verified insulin dosages and states that Lantus nor Novolog is ever skipped or not given. She reports that the Lantus and Novolog are given regardless of what blood sugar readings are.  Explained to the patient and his wife what his was currently ordered and how his blood glucose has been trending. Patient's wife states that the last dose of Lantus 110 units the patient received was from [facility] on 10/29/13.  In reviewing the chart, confirmed through [facility] tab that patient was ordered Lantus 110 units QHS, Novolog 3-15 units ACHS, Novolog 30 units TID with meals from 5/13 to 5/14 while at [facility] and CBGs ranged from 139-281 mg/dl. However, not able to see any document confirming medication administration for insulins during recent [facility]  hospitalization. CBGs appear to be fairly controlled at this present time and patient only received Lantus 8 units last night. If CBGs begin to become more elevated, will likely need to increase basal insulin and/or add meal coverage.  Will continue to follow.  Thanks, Barnie Alderman, RN, MSN, CCRN Diabetes Coordinator Inpatient Diabetes Program [phone] (Team Pager) [phone] ([facility] office) [phone] [facility] office)

## 2013-10-31 NOTE — Progress Notes (Signed)
[facility] PROGRESS NOTE  Shawn Newman FWY:[id number] DOB: 1971/02/26 DOA: 10/30/2013 PCP: PROVIDER NOT IN SYSTEM  Assessment/Plan: 1-PNA, Health care associated. Patient recently discharge from hospital, recently intubated. I will change antibiotics to IV vancomycin and Zosyn.   2-Acute Respiratory failure; multifactorial secondary to PNA, Obstructive sleep Apnea ?? , Continue with Oxygen. Continue with IV antibiotics. BNP normal unlikely volume overload.   3-Fever: probably related to PNA. Will monitor. Will consider CT abdomen imagine if fever persist.   4-Headache, dizziness. Hold BP medications, IV fluids. Check CT head.   5-HTN; hold Norvasc and HCTZ in setting of infection, dizziness.   6-Diabetes; continue with Lantus and SSI. Hold metformin while inpatient.  7-OSA; continue with CPAP.   Code Status: Full Code.  Family Communication: care discussed with wife who was at bedside.  Disposition Plan: remain inpatient.    Consultants:  none  Procedures:  none  Antibiotics:  Vancomycin 515  Zosyn 515  HPI/Subjective: Complaining of headaches, lightheaded. Mild cough. No significant abdominal pain.   Objective: Filed Vitals:   10/31/13 0700  BP: 125/77  Pulse: 93  Temp: 98 F (36.7 C)  Resp: 18   No intake or output data in the 24 hours ending 10/31/13 0848 Filed Weights   10/30/13 2255  Weight: 189.604 kg (418 lb)    Exam:   General:  No distress.   Cardiovascular: S 1, S 2 RRR  Respiratory: Decrease breath sound. No crackles.   Abdomen: BS present, soft, NT  Musculoskeletal: no edema.   Data Reviewed: Basic Metabolic Panel:  Recent Labs Lab 10/30/13 2312  NA 137  K 3.9  CL 98  CO2 28  GLUCOSE 183*  BUN 21  CREATININE 1.07  CALCIUM 9.6   Liver Function Tests:  Recent Labs Lab 10/30/13 2312  AST 96*  ALT 75*  ALKPHOS 57  BILITOT 0.6  PROT 8.2  ALBUMIN 3.6   No results found for this basename: LIPASE, AMYLASE,   in the last 168 hours No results found for this basename: AMMONIA,  in the last 168 hours CBC:  Recent Labs Lab 10/30/13 2312  WBC 8.0  NEUTROABS 4.9  HGB 14.2  HCT 45.0  MCV 87.7  PLT 301   Cardiac Enzymes: No results found for this basename: CKTOTAL, CKMB, CKMBINDEX, TROPONINI,  in the last 168 hours BNP (last 3 results)  Recent Labs  10/31/13 0746  PROBNP 51.4   CBG:  Recent Labs Lab 10/31/13 0225 10/31/13 0723  GLUCAP 249* 180*    Recent Results (from the past 240 hour(s))  CULTURE, BLOOD (ROUTINE X 2)     Status: None   Collection Time    10/30/13 11:09 PM      Result Value Ref Range Status   Specimen Description Blood LEFT ARM   Final   Special Requests BOTTLES DRAWN AEROBIC AND ANAEROBIC [facility] EACH   Final   Culture PENDING   Incomplete   Report Status PENDING   Incomplete  CULTURE, BLOOD (ROUTINE X 2)     Status: None   Collection Time    10/30/13 11:17 PM      Result Value Ref Range Status   Specimen Description Blood RIGHT ARM   Final   Special Requests BOTTLES DRAWN AEROBIC AND ANAEROBIC 8 CC EACH   Final   Culture PENDING   Incomplete   Report Status PENDING   Incomplete     Studies: Dg Chest Port 1 View  10/30/2013   CLINICAL DATA:  Fever, recent surgery  EXAM: PORTABLE CHEST - 1 VIEW  COMPARISON:  07/10/2013  FINDINGS: Cardiac shadow is within normal limits. The lungs are well aerated bilaterally. Minimal scarring is noted in the left lung base. Mild thickening of the minor fissure is noted which may be related to a small amount of pleural fluid. Mild vascular congestion is noted as well.  IMPRESSION: Mild vascular congestion and sign suggestive of mild pleural fluid.   Electronically Signed   By: Inez Catalina M.D.   On: 10/30/2013 23:30    Scheduled Meds: . aspirin EC  325 mg Oral Daily  . carvedilol  12.5 mg Oral BID WC  . [START ON 11/28/2013] cyanocobalamin  1,000 mcg Intramuscular Q30 days  . fenofibrate  54 mg Oral Daily  . heparin  5,000  Units Subcutaneous 3 times per day  . insulin aspart  0-20 Units Subcutaneous TID WC  . insulin aspart  0-5 Units Subcutaneous QHS  . insulin glargine  8 Units Subcutaneous QHS  . mometasone-formoterol  2 puff Inhalation BID  . pantoprazole  40 mg Oral Daily  . simvastatin  10 mg Oral q1800   Continuous Infusions: . sodium chloride      Active Problems:   Dyspnea   Asthma   Morbid obesity   OSA (obstructive sleep apnea)   PNA (pneumonia)    Time spent: 35 minutes.     [facility] Pager [phone]. If 7PM-7AM, please contact night-coverage at [email or web address], password [facility] 10/31/2013, 8:48 AM  LOS: 1 day

## 2013-11-01 LAB — GLUCOSE, CAPILLARY
GLUCOSE-CAPILLARY: 203 mg/dL — AB (ref 70–99)
Glucose-Capillary: 191 mg/dL — ABNORMAL HIGH (ref 70–99)
Glucose-Capillary: 200 mg/dL — ABNORMAL HIGH (ref 70–99)
Glucose-Capillary: 236 mg/dL — ABNORMAL HIGH (ref 70–99)
Glucose-Capillary: 239 mg/dL — ABNORMAL HIGH (ref 70–99)

## 2013-11-01 LAB — COMPREHENSIVE METABOLIC PANEL
ALT: 69 U/L — AB (ref 0–53)
AST: 87 U/L — ABNORMAL HIGH (ref 0–37)
Albumin: 3.4 g/dL — ABNORMAL LOW (ref 3.5–5.2)
Alkaline Phosphatase: 50 U/L (ref 39–117)
BILIRUBIN TOTAL: 0.9 mg/dL (ref 0.3–1.2)
BUN: 12 mg/dL (ref 6–23)
CALCIUM: 9.4 mg/dL (ref 8.4–10.5)
CO2: 28 meq/L (ref 19–32)
CREATININE: 0.77 mg/dL (ref 0.50–1.35)
Chloride: 98 mEq/L (ref 96–112)
GFR calc Af Amer: >90 mL/min (ref >90)
GLUCOSE: 207 mg/dL — AB (ref 70–99)
Potassium: 3.6 mEq/L — ABNORMAL LOW (ref 3.7–5.3)
Sodium: 140 mEq/L (ref 137–147)
Total Protein: 8.1 g/dL (ref 6.0–8.3)

## 2013-11-01 LAB — CBC
HEMATOCRIT: 45 % (ref 39.0–52.0)
HEMOGLOBIN: 14 g/dL (ref 13.0–17.0)
MCH: 27.2 pg (ref 26.0–34.0)
MCHC: 31.1 g/dL (ref 30.0–36.0)
MCV: 87.4 fL (ref 78.0–100.0)
Platelets: 315 10*3/uL (ref 150–400)
RBC: 5.15 MIL/uL (ref 4.22–5.81)
RDW: 13.7 % (ref 11.5–15.5)
WBC: 7.1 10*3/uL (ref 4.0–10.5)

## 2013-11-01 LAB — URINE CULTURE
Colony Count: NO GROWTH
Culture: NO GROWTH

## 2013-11-01 MED ORDER — LATANOPROST 0.005 % OP SOLN
1.0000 [drp] | Freq: Every day | OPHTHALMIC | Status: DC
  Filled 2013-11-01: qty 2.5

## 2013-11-01 MED ORDER — POTASSIUM CHLORIDE CRYS ER 20 MEQ PO TBCR
40.0000 meq | EXTENDED_RELEASE_TABLET | Freq: Once | ORAL | Status: AC
  Administered 2013-11-01: 40 meq via ORAL
  Filled 2013-11-01: qty 2

## 2013-11-01 MED ORDER — FUROSEMIDE 10 MG/ML IJ SOLN
20.0000 mg | Freq: Once | INTRAMUSCULAR | Status: AC
  Administered 2013-11-01: 20 mg via INTRAVENOUS
  Filled 2013-11-01: qty 2

## 2013-11-01 NOTE — Progress Notes (Signed)
[facility] PROGRESS NOTE  Shawn Newman VHQ:[id number] DOB: 1971/01/09 DOA: 10/30/2013 PCP: PROVIDER NOT IN SYSTEM  Assessment/Plan: 1-PNA, Health care associated. Patient recently discharge from hospital, recently intubated. Continue with IV vancomycin and Zosyn. Will transition to levaquin at time of discharge.   2-Acute Respiratory failure; multifactorial secondary to PNA, Obstructive sleep Apnea ?? , Continue with Oxygen. Continue with IV antibiotics. BNP normal.  ECHO with diastolic dysfunction grade 2. He is breathing better after one dose of lasix. Will repeat lasix dose.   3-Diastolic Heart Failure exacerbation; chest x ray with congestion. He is requiring less oxygen amount after IV lasix.   4-Fever: probably related to PNA. Will monitor. Will consider CT abdomen imagine if fever persist.   5-Headache, dizziness. Hold BP medications. CT head negative. Symptoms has resolved.   6-HTN; hold Norvasc and HCTZ in setting of infection, dizziness.   7-Diabetes; continue with Lantus 8 units and SSI. Hold metformin while inpatient.   8-OSA; continue with CPAP.  9-Recent laparoscopy; inform patient surgeon of admission. Patient denies any significant  abdominal pain. No evidence of intra-abdominal infection.  10-Transaminases; stable.   Code Status: Full Code.  Family Communication: care discussed with wife who was at bedside.  Disposition Plan: remain inpatient.  Home in 24 to 48 hours.    Consultants:  none  Procedures:  none  Antibiotics:  Vancomycin 515  Zosyn 515  HPI/Subjective: He is feeling much better. He is breathing better. He wants to go home.   Objective: Filed Vitals:   11/01/13 1423  BP: 110/55  Pulse: 88  Temp: 98.1 F (36.7 C)  Resp:     Intake/Output Summary (Last 24 hours) at 11/01/13 1640 Last data filed at 11/01/13 1318  Gross per 24 hour  Intake    960 ml  Output    500 ml  Net    460 ml   Filed Weights   10/30/13 2255   Weight: 189.604 kg (418 lb)    Exam:   General:  No distress.   Cardiovascular: S 1, S 2 RRR  Respiratory: Decrease breath sound. No crackles.   Abdomen: BS present, soft, NT  Musculoskeletal: no edema.   Data Reviewed: Basic Metabolic Panel:  Recent Labs Lab 10/30/13 2312 11/01/13 0638  NA 137 140  K 3.9 3.6*  CL 98 98  CO2 28 28  GLUCOSE 183* 207*  BUN 21 12  CREATININE 1.07 0.77  CALCIUM 9.6 9.4   Liver Function Tests:  Recent Labs Lab 10/30/13 2312 11/01/13 0638  AST 96* 87*  ALT 75* 69*  ALKPHOS 57 50  BILITOT 0.6 0.9  PROT 8.2 8.1  ALBUMIN 3.6 3.4*   No results found for this basename: LIPASE, AMYLASE,  in the last 168 hours No results found for this basename: AMMONIA,  in the last 168 hours CBC:  Recent Labs Lab 10/30/13 2312 11/01/13 0638  WBC 8.0 7.1  NEUTROABS 4.9  --   HGB 14.2 14.0  HCT 45.0 45.0  MCV 87.7 87.4  PLT 301 315   Cardiac Enzymes: No results found for this basename: CKTOTAL, CKMB, CKMBINDEX, TROPONINI,  in the last 168 hours BNP (last 3 results)  Recent Labs  10/31/13 0746  PROBNP 51.4   CBG:  Recent Labs Lab 10/31/13 2023 11/01/13 0051 11/01/13 0716 11/01/13 1130 11/01/13 1634  GLUCAP 216* 191* 203* 236* 200*    Recent Results (from the past 240 hour(s))  CULTURE, BLOOD (ROUTINE X 2)     Status: None  Collection Time    10/30/13 11:09 PM      Result Value Ref Range Status   Specimen Description Blood LEFT ARM   Final   Special Requests BOTTLES DRAWN AEROBIC AND ANAEROBIC 6CC EACH   Final   Culture NO GROWTH 2 DAYS   Final   Report Status PENDING   Incomplete  CULTURE, BLOOD (ROUTINE X 2)     Status: None   Collection Time    10/30/13 11:17 PM      Result Value Ref Range Status   Specimen Description Blood RIGHT ARM   Final   Special Requests BOTTLES DRAWN AEROBIC AND ANAEROBIC 8 CC EACH   Final   Culture NO GROWTH 2 DAYS   Final   Report Status PENDING   Incomplete  URINE CULTURE     Status:  None   Collection Time    10/30/13 11:17 PM      Result Value Ref Range Status   Specimen Description URINE, CLEAN CATCH   Final   Special Requests NONE   Final   Culture  Setup Time     Final   Value: 10/31/2013 15:25     Performed at [facility]     Final   Value: NO GROWTH     Performed at [facility]   Culture     Final   Value: NO GROWTH     Performed at [facility]   Report Status 11/01/2013 FINAL   Final     Studies: Ct Head Wo Contrast  10/31/2013   CLINICAL DATA:  Dizziness with headache. History of hypertension and diabetes.  EXAM: CT HEAD WITHOUT CONTRAST  TECHNIQUE: Contiguous axial images were obtained from the base of the skull through the vertex without contrast.  COMPARISON:  04/25/2013.  FINDINGS: Normal appearance of the intracranial structures. No evidence for acute hemorrhage, mass lesion, midline shift, hydrocephalus or large infarct. No acute bony abnormality. The visualized sinuses are clear. Hypoplastic maxillary sinuses with no visible acute sinus air-fluid level. No significant change from prior normal study.  IMPRESSION: No acute intracranial abnormality.  Negative exam.   Electronically Signed   By: Rolla Flatten M.D.   On: 10/31/2013 09:54   Dg Chest Port 1 View  10/31/2013   CLINICAL DATA:  Evaluate for pleural effusions.  EXAM: PORTABLE CHEST - 1 VIEW  COMPARISON:  DG CHEST 1V PORT dated 10/30/2013  FINDINGS: Stable enlarged cardiac and mediastinal contours. Elevation of the right hemidiaphragm. No large consolidative pulmonary opacity. No pleural effusion or pneumothorax.  IMPRESSION: No acute cardiopulmonary process.  Cardiomegaly.   Electronically Signed   By: Lovey Newcomer M.D.   On: 10/31/2013 21:24   Dg Chest Port 1 View  10/30/2013   CLINICAL DATA:  Fever, recent surgery  EXAM: PORTABLE CHEST - 1 VIEW  COMPARISON:  07/10/2013  FINDINGS: Cardiac shadow is within normal limits. The lungs are well aerated bilaterally.  Minimal scarring is noted in the left lung base. Mild thickening of the minor fissure is noted which may be related to a small amount of pleural fluid. Mild vascular congestion is noted as well.  IMPRESSION: Mild vascular congestion and sign suggestive of mild pleural fluid.   Electronically Signed   By: Inez Catalina M.D.   On: 10/30/2013 23:30    Scheduled Meds: . aspirin EC  325 mg Oral Daily  . carvedilol  12.5 mg Oral BID WC  . [START ON 11/28/2013]  cyanocobalamin  1,000 mcg Intramuscular Q30 days  . fenofibrate  54 mg Oral Daily  . heparin  5,000 Units Subcutaneous 3 times per day  . insulin aspart  0-20 Units Subcutaneous TID WC  . insulin aspart  0-5 Units Subcutaneous QHS  . insulin glargine  8 Units Subcutaneous QHS  . ipratropium-albuterol  3 mL Nebulization Q6H  . mometasone-formoterol  2 puff Inhalation BID  . pantoprazole  40 mg Oral Daily  . piperacillin-tazobactam (ZOSYN)  IV  3.375 g Intravenous Q8H  . vancomycin  1,500 mg Intravenous Q12H   Continuous Infusions:    Active Problems:   Dyspnea   Asthma   Morbid obesity   OSA (obstructive sleep apnea)   PNA (pneumonia)   Pneumonia    Time spent: 35 minutes.     [facility] Pager [phone]. If 7PM-7AM, please contact night-coverage at [email or web address], password [facility] 11/01/2013, 4:40 PM  LOS: 2 days

## 2013-11-02 LAB — BASIC METABOLIC PANEL
BUN: 14 mg/dL (ref 6–23)
CO2: 26 mEq/L (ref 19–32)
Calcium: 9.5 mg/dL (ref 8.4–10.5)
Chloride: 101 mEq/L (ref 96–112)
Creatinine, Ser: 0.78 mg/dL (ref 0.50–1.35)
GFR calc non Af Amer: >90 mL/min (ref >90)
Glucose, Bld: 270 mg/dL — ABNORMAL HIGH (ref 70–99)
POTASSIUM: 4.1 meq/L (ref 3.7–5.3)
Sodium: 140 mEq/L (ref 137–147)

## 2013-11-02 LAB — GLUCOSE, CAPILLARY: GLUCOSE-CAPILLARY: 277 mg/dL — AB (ref 70–99)

## 2013-11-02 MED ORDER — INSULIN GLARGINE 100 UNIT/ML ~~LOC~~ SOLN: 15.0000 [IU] | Freq: Every day | SUBCUTANEOUS | Status: DC

## 2013-11-02 MED ORDER — FUROSEMIDE 20 MG PO TABS: 20.0000 mg | ORAL_TABLET | Freq: Every day | ORAL | Status: DC

## 2013-11-02 MED ORDER — INSULIN ASPART 100 UNIT/ML ~~LOC~~ SOLN: 10.0000 [IU] | Freq: Three times a day (TID) | SUBCUTANEOUS | Status: AC

## 2013-11-02 MED ORDER — OXYCODONE-ACETAMINOPHEN 5-325 MG PO TABS: 1.0000 | ORAL_TABLET | ORAL | Status: DC | PRN

## 2013-11-02 MED ORDER — LEVOFLOXACIN 750 MG PO TABS: 750.0000 mg | ORAL_TABLET | Freq: Every day | ORAL | Status: DC

## 2013-11-02 NOTE — Discharge Summary (Signed)
Physician Discharge Summary  Shawn Newman NUU:[id number] DOB: [birth year] DOA: 10/30/2013  PCP: PROVIDER NOT IN SYSTEM  Admit date: 10/30/2013 Discharge date: 11/02/2013  Time spent: 35 minutes  Recommendations for Outpatient Follow-up:  1. Need referral to cardiologist evaluation for diastolic dysfunction.  2. Needs to follow up with surgeon for follow up after laparoscopy.   Discharge Diagnoses:    PNA (pneumonia)   Diastolic Heart Failure exacerbation   Asthma   Morbid obesity   OSA (obstructive sleep apnea)   Pneumonia   Discharge Condition: Stable  Diet recommendation: Heart Healthy  Filed Weights   10/30/13 2255  Weight: 189.604 kg (418 lb)    History of present illness:  Shawn Newman is an 43 y.o. male with hx DM2, on Insulin, morbid obesity, sleep apnea on CPAP, asthma, presents to the ER wish subjective fever and coughs today. Yesterday, he was at [facility] for a bariatric surgery, but the procedure was terminated, as he couldn't lie down flat. He had oxygen desaturation. This happened after he was intubated. Subsequently, he had laparoscopy, and was told his liver was quite large. He was subsequently discharged on oxygen yesterday. He felt chills, and has a nonproductive coughs, but no abdominal pain, nausea or vomiting. He has no black or bloody stool. Evalaution in the ER showed no leukocytosis, and CXR was clear except for mild vascular congestion and mild pleural effusion. He was also desat with ambulation. His temp was found to be 102 in the ER. Hopsitalist was asked to admit him for possible early PNA. He was given Rocephin and ZIthromax in the ER.    Hospital Course:  1-PNA, Health care associated. Patient recently discharge from [facility], recently intubated. He received  IV vancomycin and Zosyn for 2 days. He remain afebrile . Will transition to levaquin at time of discharge.  Patient back to baseline, feeling well. He wants to go  home.  2-Acute Respiratory failure; multifactorial secondary to PNA, Obstructive sleep Apnea , heart failure. , Continue with Oxygen. Continue with IV antibiotics. BNP normal. ECHO with diastolic dysfunction grade 2. He is breathing better after 2 dose of lasix. He is now on 2l. He is on chronic home oxygen.   3-Diastolic Heart Failure exacerbation; chest x ray with congestion. He is requiring less oxygen amount after IV lasix. Will discharge on low dose lasix 20 mg daily.  4-Fever: probably related to PNA. Will monitor.  5-Headache, dizziness. CT head negative. Symptoms has resolved.  6-HTN; hold Norvasc and HCTZ in setting of infection, dizziness. Discharge on lasix.  7-Diabetes; he will be discharge on lantus 10 units. He never required here 100 units of lantus. He needs to modified diet.  8-OSA; continue with CPAP.  9-Recent laparoscopy; informed patient surgeon of admission. Patient denies any significant abdominal pain. No evidence of intra-abdominal infection.  10-Transaminases; stable.    Procedures: ECHO; Left ventricle: The cavity size was normal. Wall thickness was increased in a pattern of moderate LVH. Systolic function was normal. The estimated ejection fraction was in the range of 55% to 60%. Wall motion was normal; there were no regional wall motion abnormalities. Features are consistent with a pseudonormal left ventricular filling pattern, with concomitant abnormal relaxation and increased filling pressure (grade 2 diastolic dysfunction). - Left atrium: The atrium was at the upper limits of normal in size.    Consultations:  none  Discharge Exam: Filed Vitals:   11/02/13 0541  BP: 133/74  Pulse: 82  Temp: 98  F (36.7 C)  Resp: 21    General: No distress.  Cardiovascular: S 1, S 2 RRR Respiratory: CTA  Discharge Instructions You were cared for by a hospitalist during your hospital stay. If you have any questions about your discharge medications or the  care you received while you were in the hospital after you are discharged, you can call the unit and asked to speak with the hospitalist on call if the hospitalist that took care of you is not available. Once you are discharged, your primary care physician will handle any further medical issues. Please note that NO REFILLS for any discharge medications will be authorized once you are discharged, as it is imperative that you return to your primary care physician (or establish a relationship with a primary care physician if you do not have one) for your aftercare needs so that they can reassess your need for medications and monitor your lab values.  Discharge Instructions   Diet Carb Modified    Complete by:  As directed      Increase activity slowly    Complete by:  As directed             Medication List    STOP taking these medications       amLODipine 10 MG tablet  Commonly known as:  NORVASC     hydrochlorothiazide 25 MG tablet  Commonly known as:  HYDRODIURIL     pravastatin 20 MG tablet  Commonly known as:  PRAVACHOL      TAKE these medications       albuterol-ipratropium 18-103 MCG/ACT inhaler  Commonly known as:  COMBIVENT  Inhale 2 puffs into the lungs every 6 (six) hours as needed. For shortness of breath     aspirin EC 325 MG tablet  Take 325 mg by mouth daily.     budesonide-formoterol 160-4.5 MCG/ACT inhaler  Commonly known as:  SYMBICORT  Inhale 2 puffs into the lungs 2 (two) times daily as needed (shortness of breath).     carvedilol 12.5 MG tablet  Commonly known as:  COREG  Take 12.5 mg by mouth 2 (two) times daily with a meal.     cyanocobalamin 1000 MCG/ML injection  Commonly known as:  (VITAMIN B-12)  Inject 1,000 mcg into the muscle every 30 (thirty) days.     Fluticasone-Salmeterol 250-50 MCG/DOSE Aepb  Commonly known as:  ADVAIR  Inhale 1 puff into the lungs 2 (two) times daily as needed (shortness of breath).     furosemide 20 MG tablet   Commonly known as:  LASIX  Take 1 tablet (20 mg total) by mouth daily.     ibuprofen 200 MG tablet  Commonly known as:  ADVIL,MOTRIN  Take 400 mg by mouth every 6 (six) hours as needed for headache.     insulin aspart 100 UNIT/ML injection  Commonly known as:  novoLOG  Inject 10 Units into the skin 3 (three) times daily with meals.     insulin glargine 100 UNIT/ML injection  Commonly known as:  LANTUS  Inject 0.15 mLs (15 Units total) into the skin at bedtime.     levofloxacin 750 MG tablet  Commonly known as:  LEVAQUIN  Take 1 tablet (750 mg total) by mouth daily.     metFORMIN 1000 MG tablet  Commonly known as:  GLUCOPHAGE  Take 1,000 mg by mouth 2 (two) times daily with a meal.     omeprazole 20 MG capsule  Commonly known as:  PRILOSEC  Take 1 capsule (20 mg total) by mouth daily.     oxyCODONE-acetaminophen 5-325 MG per tablet  Commonly known as:  PERCOCET/ROXICET  Take 1 tablet by mouth every 4 (four) hours as needed for severe pain.     TRILIPIX 135 MG capsule  Generic drug:  Choline Fenofibrate  Take 135 mg by mouth daily.     XALATAN 0.005 % ophthalmic solution  Generic drug:  latanoprost  Place 1 drop into the right eye at bedtime.       Allergies  Allergen Reactions  . Sulfa Antibiotics Rash       Follow-up Information   Follow up with Sandrea Hughs, MD.   Specialty:  Pulmonary Disease   Contact information:   [address] [place] [address] [phone]        The results of significant diagnostics from this hospitalization (including imaging, microbiology, ancillary and laboratory) are listed below for reference.    Significant Diagnostic Studies: Ct Head Wo Contrast  10/31/2013   CLINICAL DATA:  Dizziness with headache. History of hypertension and diabetes.  EXAM: CT HEAD WITHOUT CONTRAST  TECHNIQUE: Contiguous axial images were obtained from the base of the skull through the vertex without contrast.  COMPARISON:  04/25/2013.   FINDINGS: Normal appearance of the intracranial structures. No evidence for acute hemorrhage, mass lesion, midline shift, hydrocephalus or large infarct. No acute bony abnormality. The visualized sinuses are clear. Hypoplastic maxillary sinuses with no visible acute sinus air-fluid level. No significant change from prior normal study.  IMPRESSION: No acute intracranial abnormality.  Negative exam.   Electronically Signed   By: Davonna Belling M.D.   On: 10/31/2013 09:54   Dg Chest Port 1 View  10/31/2013   CLINICAL DATA:  Evaluate for pleural effusions.  EXAM: PORTABLE CHEST - 1 VIEW  COMPARISON:  DG CHEST 1V PORT dated 10/30/2013  FINDINGS: Stable enlarged cardiac and mediastinal contours. Elevation of the right hemidiaphragm. No large consolidative pulmonary opacity. No pleural effusion or pneumothorax.  IMPRESSION: No acute cardiopulmonary process.  Cardiomegaly.   Electronically Signed   By: Annia Belt M.D.   On: 10/31/2013 21:24   Dg Chest Port 1 View  10/30/2013   CLINICAL DATA:  Fever, recent surgery  EXAM: PORTABLE CHEST - 1 VIEW  COMPARISON:  07/10/2013  FINDINGS: Cardiac shadow is within normal limits. The lungs are well aerated bilaterally. Minimal scarring is noted in the left lung base. Mild thickening of the minor fissure is noted which may be related to a small amount of pleural fluid. Mild vascular congestion is noted as well.  IMPRESSION: Mild vascular congestion and sign suggestive of mild pleural fluid.   Electronically Signed   By: Alcide Clever M.D.   On: 10/30/2013 23:30    Microbiology: Recent Results (from the past 240 hour(s))  CULTURE, BLOOD (ROUTINE X 2)     Status: None   Collection Time    10/30/13 11:09 PM      Result Value Ref Range Status   Specimen Description Blood LEFT ARM   Final   Special Requests BOTTLES DRAWN AEROBIC AND ANAEROBIC 6CC EACH   Final   Culture NO GROWTH 3 DAYS   Final   Report Status PENDING   Incomplete  CULTURE, BLOOD (ROUTINE X 2)     Status:  None   Collection Time    10/30/13 11:17 PM      Result Value Ref Range Status   Specimen Description Blood RIGHT ARM  Final   Special Requests BOTTLES DRAWN AEROBIC AND ANAEROBIC 8 CC EACH   Final   Culture NO GROWTH 3 DAYS   Final   Report Status PENDING   Incomplete  URINE CULTURE     Status: None   Collection Time    10/30/13 11:17 PM      Result Value Ref Range Status   Specimen Description URINE, CLEAN CATCH   Final   Special Requests NONE   Final   Culture  Setup Time     Final   Value: 10/31/2013 15:25     Performed at [facility]     Final   Value: NO GROWTH     Performed at [facility]   Culture     Final   Value: NO GROWTH     Performed at [facility]   Report Status 11/01/2013 FINAL   Final     Labs: Basic Metabolic Panel:  Recent Labs Lab 10/30/13 2312 11/01/13 0638 11/02/13 0614  NA 137 140 140  K 3.9 3.6* 4.1  CL 98 98 101  CO2 28 28 26   GLUCOSE 183* 207* 270*  BUN 21 12 14   CREATININE 1.07 0.77 0.78  CALCIUM 9.6 9.4 9.5   Liver Function Tests:  Recent Labs Lab 10/30/13 2312 11/01/13 0638  AST 96* 87*  ALT 75* 69*  ALKPHOS 57 50  BILITOT 0.6 0.9  PROT 8.2 8.1  ALBUMIN 3.6 3.4*   No results found for this basename: LIPASE, AMYLASE,  in the last 168 hours No results found for this basename: AMMONIA,  in the last 168 hours CBC:  Recent Labs Lab 10/30/13 2312 11/01/13 0638  WBC 8.0 7.1  NEUTROABS 4.9  --   HGB 14.2 14.0  HCT 45.0 45.0  MCV 87.7 87.4  PLT 301 315   Cardiac Enzymes: No results found for this basename: CKTOTAL, CKMB, CKMBINDEX, TROPONINI,  in the last 168 hours BNP: BNP (last 3 results)  Recent Labs  10/31/13 0746  PROBNP 51.4   CBG:  Recent Labs Lab 11/01/13 0716 11/01/13 1130 11/01/13 1634 11/01/13 2125 11/02/13 0728  GLUCAP 203* 236* 200* 239* 277*       Signed:  Belkys A Regalado  [facility] 11/02/2013, 8:52 AM

## 2013-11-04 ENCOUNTER — Ambulatory Visit (INDEPENDENT_AMBULATORY_CARE_PROVIDER_SITE_OTHER): Payer: Medicaid Other | Admitting: Internal Medicine

## 2013-11-04 ENCOUNTER — Encounter: Payer: Self-pay | Admitting: Internal Medicine

## 2013-11-04 VITALS — BP 122/78 | HR 87 | Temp 98.5°F | Ht 73.0 in | Wt >= 6400 oz

## 2013-11-04 DIAGNOSIS — J45909 Unspecified asthma, uncomplicated: Secondary | ICD-10-CM

## 2013-11-04 DIAGNOSIS — J9610 Chronic respiratory failure, unspecified whether with hypoxia or hypercapnia: Secondary | ICD-10-CM

## 2013-11-04 LAB — CULTURE, BLOOD (ROUTINE X 2)
Culture: NO GROWTH
Culture: NO GROWTH

## 2013-11-04 MED ORDER — FAMOTIDINE 20 MG PO TABS: ORAL_TABLET | ORAL | Status: DC

## 2013-11-04 MED ORDER — PANTOPRAZOLE SODIUM 40 MG PO TBEC: 40.0000 mg | DELAYED_RELEASE_TABLET | Freq: Every day | ORAL | Status: DC

## 2013-11-04 MED ORDER — BUDESONIDE-FORMOTEROL FUMARATE 80-4.5 MCG/ACT IN AERO: INHALATION_SPRAY | RESPIRATORY_TRACT | Status: DC

## 2013-11-04 NOTE — Patient Instructions (Addendum)
Change symbicort to 80 Take 2 puffs first thing in am and then another 2 puffs about 12 hours later and use only this inhaler, stop all the others  Work on inhaler technique:  relax and gently blow all the way out then take a nice smooth deep breath back in, triggering the inhaler at same time you start breathing in.  Hold for up to 5 seconds if you can.  Rinse and gargle with water when done  Pantoprazole (protonix) 40 mg   Take 30-60 min before first meal of the day and Pepcid 20 mg one bedtime    GERD (REFLUX)  is an extremely common cause of respiratory symptoms, many times with no significant heartburn at all.    It can be treated with medication, but also with lifestyle changes including avoidance of late meals, excessive alcohol, smoking cessation, and avoid fatty foods, chocolate, peppermint, colas, red wine, and acidic juices such as orange juice.  NO MINT OR MENTHOL PRODUCTS SO NO COUGH DROPS  USE SUGARLESS CANDY INSTEAD (jolley ranchers or Stover's)  NO OIL BASED VITAMINS - use powdered substitutes.    Weight control is simply a matter of calorie balance which needs to be tilted in your favor by eating less and exercising more.  To get the most out of exercise, you need to be continuously aware that you are short of breath, but never out of breath, for 30 minutes daily. As you improve, it will actually be easier for you to do the same amount of exercise  in  30 minutes so always push to the level where you are short of breath.  If this does not result in gradual weight reduction then I strongly recommend you see a nutritionist with a food diary x 2 weeks so that we can work out a negative calorie balance which is universally effective in steady weight loss programs.  Think of your calorie balance like you do your bank account where in this case you want the balance to go down so you must take in less calories than you burn up.  It's just that simple:  Hard to do, but easy to understand.   Good luck!   Pulmonary follow up is as needed

## 2013-11-04 NOTE — Progress Notes (Signed)
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  History of Present Illness  04/11/2012 1st pulmonary eval cc indolent onset progressively worse nonprod cough and sob on advair x 2 years worse since quit smoking assoc with further wt gain - advair was started when other people thought he was wheezing but he was not aware.  No better p prednisone, some better with combivent - uses up to 4-5 x per day and does better at hs. No worse when forgets to take advair.    >>symbicort rx ,   /04/25/2012 Follow up and med review  Patient returns for a two-week followup for shortness of breath and cough Seen last visit for her initial pulmonary consult her for a six-month history of progressively worsening dry cough and shortness of breath. He has had multiple emergency room visits with last chest x-ray E. 03/24/2012 with no acute process noted. D-dimer and CBC and cardiac enzymes were negative in September He does have sleep apnea, and wears CPAP at night He quit smoking approximately 4 months ago  Last visit was placed on a trial of Symbicort, given a cough suppression regimen with Percocet And started on Dexilant  for reflux suppression He says he did have some improvement in cough, however, did not resolve and cough is worse at night  and has severe coughing paroxysms to the point where he has rib pain He denies any fever or hemoptysis, orthopnea, or discolored mucus, or increased leg swelling He does request additional Percocet. Walk today in office with minimal desats to 89% , w/ quick rebound to 90-93%.  rec We are setting  you up for an overnight oximetry on CPAP  Follow up in 4 weeks with Dr. Melvyn Novas with a pulmonary function test (PFT)   Delsym 2 teaspoons twice daily as needed. For cough May use Percocet 1 every 4 hours as needed. For cough control Use. Chlor tabs 4 mg 1 every 4 hours as needed. For postnasal drainage, throat clearing, tickle in throat Follow med calendar closely and bring to each visit   06/11/2012 f/u ov/Aileen Amore no med calendar cc breathing better to his satisfaction on symbicort 160 2 bid and no need combivent.  rec Continue symbicort 160 Take 2 puffs first thing in am and then another 2 puffs about 12 hours later and combivent is just as needed You do not have significant copd and you never will unless you resume smoking   11/04/2013 f/u ov/Ambermarie Honeyman re: restrictive changes from obesity  Chief Complaint  Patient presents with  . Asthma    Breathing is worse since last OV. Having to use oxygen.  turned down by [facility] due to breathing was being considered for gastric bypass and d/c on 02   No obvious daytime variabilty or assoc chronic cough or cp or chest tightness, subjective wheeze overt sinus or hb symptoms. No unusual exp hx or h/o childhood pna/ asthma or premature birth to his knowledge.   Sleeping ok on cpap without nocturnal  or early am exacerbation  of respiratory  c/o's or need for noct saba.  Also denies any obvious fluctuation of symptoms with weather or environmental changes or other aggravating or alleviating factors except as outlined above  ROS  The following are not active complaints unless bolded sore throat, dysphagia, dental problems, itching, sneezing,  nasal congestion or excess/ purulent secretions, ear ache,   fever, chills, sweats, unintended wt loss, pleuritic or exertional cp, hemoptysis,  orthopnea pnd or leg swelling, presyncope, palpitations, heartburn, abdominal pain, anorexia, nausea, vomiting, diarrhea  or change in bowel or urinary habits, change in stools or  urine, dysuria,hematuria,  rash, arthralgias, visual complaints, headache, numbness weakness or ataxia or problems with walking or coordination,  change in mood/affect or memory.                    Objective:   Physical Exam  11/04/2013       415  Wt Readings from Last 3 Encounters:  06/11/12 376 lb (170.552 kg)  05/09/12 363 lb (164.656 kg)  04/25/12 375 lb 6.4 oz (170.28 kg)      HEENT: nl dentition, turbinates, and orophanx. Nl external ear canals without cough reflex   NECK :  without JVD/Nodes/TM/ nl carotid upstrokes bilaterally   LUNGS: no acc muscle use,  CTA , upper airway pseudowheezing resolved  CV:  RRR  no s3 or murmur or increase in P2, no edema   ABD:  massivley obese with huge pannus but soft and nontender . No bruits or organomegaly, bowel sounds nl  MS:  warm without deformities, calf tenderness, cyanosis or clubbing  SKIN: warm and dry without lesions    NEURO:  alert, approp, no deficits      pcxr 10/31/13  No acute cardiopulmonary process      Assessment & Plan:

## 2013-11-05 NOTE — Assessment & Plan Note (Signed)
-   PFT's wnl x low erv 06/11/2012   Very little evidence to support asthma here so just needs low dose symbicort 2 bid prn  The proper method of use, as well as anticipated side effects, of a metered-dose inhaler are discussed and demonstrated to the patient. Improved effectiveness after extensive coaching during this visit to a level of approximately  75%     Each maintenance medication was reviewed in detail including most importantly the difference between maintenance and as needed and under what circumstances the prns are to be used.  Please see instructions for details which were reviewed in writing and the patient given a copy.

## 2013-11-05 NOTE — Assessment & Plan Note (Signed)
-   d/c'd on 02 by [facility] p unable to do gastric bypass 10/29/13  - 11/05/2013  Walked RA x 3 laps @ 185 ft each stopped due to  Sob/ sats 87 at very end    I had an extended discussion with the patient today lasting 15 to 20 minutes of a 25 minute visit on the following issues:  His desats are likely entirely related to extrinsic effects of obesity causing basilar atx and ironically turned down for obesity surgery due to obesity related desats but should be able to get back in shape for surgery with better attention to calorie balance.  See instructions for specific recommendations which were reviewed directly with the patient who was given a copy with highlighter outlining the key components.   Would not need 02 at a paced (slower) lever of exertion maintained for a longer period but since has portable tanks ok certainly ok to use them with his stationery bike as already approved for this through [facility]

## 2013-11-09 ENCOUNTER — Encounter (HOSPITAL_COMMUNITY): Payer: Self-pay | Admitting: Emergency Medicine

## 2013-11-09 ENCOUNTER — Emergency Department (HOSPITAL_COMMUNITY)
Admission: EM | Admit: 2013-11-09 | Discharge: 2013-11-09 | Disposition: A | Discharge status: Home / Self Care | Payer: Medicaid Other | Attending: Emergency Medicine | Admitting: Emergency Medicine

## 2013-11-09 DIAGNOSIS — J45909 Unspecified asthma, uncomplicated: Secondary | ICD-10-CM | POA: Insufficient documentation

## 2013-11-09 DIAGNOSIS — M5430 Sciatica, unspecified side: Secondary | ICD-10-CM | POA: Insufficient documentation

## 2013-11-09 DIAGNOSIS — E119 Type 2 diabetes mellitus without complications: Secondary | ICD-10-CM | POA: Insufficient documentation

## 2013-11-09 DIAGNOSIS — Z794 Long term (current) use of insulin: Secondary | ICD-10-CM | POA: Insufficient documentation

## 2013-11-09 DIAGNOSIS — R197 Diarrhea, unspecified: Secondary | ICD-10-CM | POA: Insufficient documentation

## 2013-11-09 DIAGNOSIS — I10 Essential (primary) hypertension: Secondary | ICD-10-CM | POA: Insufficient documentation

## 2013-11-09 DIAGNOSIS — G8929 Other chronic pain: Secondary | ICD-10-CM | POA: Insufficient documentation

## 2013-11-09 DIAGNOSIS — Z87891 Personal history of nicotine dependence: Secondary | ICD-10-CM | POA: Insufficient documentation

## 2013-11-09 DIAGNOSIS — Z7982 Long term (current) use of aspirin: Secondary | ICD-10-CM | POA: Insufficient documentation

## 2013-11-09 DIAGNOSIS — Z9981 Dependence on supplemental oxygen: Secondary | ICD-10-CM | POA: Insufficient documentation

## 2013-11-09 DIAGNOSIS — G4730 Sleep apnea, unspecified: Secondary | ICD-10-CM | POA: Insufficient documentation

## 2013-11-09 DIAGNOSIS — Z79899 Other long term (current) drug therapy: Secondary | ICD-10-CM | POA: Insufficient documentation

## 2013-11-09 LAB — COMPREHENSIVE METABOLIC PANEL
ALBUMIN: 3.7 g/dL (ref 3.5–5.2)
ALK PHOS: 57 U/L (ref 39–117)
ALT: 43 U/L (ref 0–53)
AST: 35 U/L (ref 0–37)
BUN: 13 mg/dL (ref 6–23)
CO2: 24 mEq/L (ref 19–32)
Calcium: 9.8 mg/dL (ref 8.4–10.5)
Chloride: 100 mEq/L (ref 96–112)
Creatinine, Ser: 0.82 mg/dL (ref 0.50–1.35)
GFR calc Af Amer: >90 mL/min (ref >90)
GFR calc non Af Amer: >90 mL/min (ref >90)
Glucose, Bld: 199 mg/dL — ABNORMAL HIGH (ref 70–99)
POTASSIUM: 3.7 meq/L (ref 3.7–5.3)
SODIUM: 138 meq/L (ref 137–147)
TOTAL PROTEIN: 8.4 g/dL — AB (ref 6.0–8.3)
Total Bilirubin: 0.5 mg/dL (ref 0.3–1.2)

## 2013-11-09 LAB — CBC WITH DIFFERENTIAL/PLATELET
BASOS PCT: 0 % (ref 0–1)
Basophils Absolute: 0 10*3/uL (ref 0.0–0.1)
EOS ABS: 0.1 10*3/uL (ref 0.0–0.7)
Eosinophils Relative: 2 % (ref 0–5)
HCT: 44.9 % (ref 39.0–52.0)
Hemoglobin: 14.5 g/dL (ref 13.0–17.0)
LYMPHS ABS: 3.5 10*3/uL (ref 0.7–4.0)
Lymphocytes Relative: 45 % (ref 12–46)
MCH: 27.3 pg (ref 26.0–34.0)
MCHC: 32.3 g/dL (ref 30.0–36.0)
MCV: 84.6 fL (ref 78.0–100.0)
Monocytes Absolute: 0.6 10*3/uL (ref 0.1–1.0)
Monocytes Relative: 8 % (ref 3–12)
NEUTROS PCT: 45 % (ref 43–77)
Neutro Abs: 3.6 10*3/uL (ref 1.7–7.7)
Platelets: 359 10*3/uL (ref 150–400)
RBC: 5.31 MIL/uL (ref 4.22–5.81)
RDW: 13.7 % (ref 11.5–15.5)
WBC: 7.9 10*3/uL (ref 4.0–10.5)

## 2013-11-09 LAB — LIPASE, BLOOD: Lipase: 28 U/L (ref 11–59)

## 2013-11-09 MED ORDER — SODIUM CHLORIDE 0.9 % IV BOLUS (SEPSIS): 1000.0000 mL | Freq: Once | INTRAVENOUS | Status: DC

## 2013-11-09 MED ORDER — METRONIDAZOLE 500 MG PO TABS: 500.0000 mg | ORAL_TABLET | Freq: Two times a day (BID) | ORAL | Status: DC

## 2013-11-09 MED ORDER — OXYCODONE-ACETAMINOPHEN 5-325 MG PO TABS
2.0000 | ORAL_TABLET | Freq: Once | ORAL | Status: AC
  Administered 2013-11-09: 2 via ORAL
  Filled 2013-11-09: qty 2

## 2013-11-09 MED ORDER — SODIUM CHLORIDE 0.9 % IV SOLN: INTRAVENOUS | Status: DC

## 2013-11-09 MED ORDER — METRONIDAZOLE 500 MG PO TABS
ORAL_TABLET | ORAL | Status: AC
  Administered 2013-11-09: 500 mg
  Filled 2013-11-09: qty 1

## 2013-11-09 NOTE — ED Notes (Signed)
Pt c/o low back pain, requesting pain medication.

## 2013-11-09 NOTE — ED Notes (Signed)
Family at bedside. Patient given Ginger ale at this time.

## 2013-11-09 NOTE — ED Provider Notes (Signed)
CSN: [id number]     Arrival date & time 11/09/13  1820 History   This chart was scribed for Shawn Jacobsen, MD by Roxan Diesel, ED scribe.  This patient was seen in room [facility]11/[facility]11 and the patient's care was started at 7:19 PM.   Chief Complaint  Patient presents with  . Abdominal Pain    The history is provided by the patient. No language interpreter was used.    HPI Comments: Shawn Newman is a 43 y.o. male who presents to the Emergency Department complaining of one week of persistent cramping abdominal pain with associated diarrhea.  Pt was recently hospitalized for pneumonia and discharged home on antibiotics one week ago.  Since then he has experienced persistent abdominal cramping followed by watery diarrhea.  Symptoms are not relieved or worsened by anything, including eating.  He denies blood in stool, fever, or vomiting.  He has been taking Kaopectate, without relief.     Past Medical History  Diagnosis Date  . Hypertension   . Diabetes mellitus   . Abscess   . Chronic back pain   . Sciatica   . Asthma   . Bronchitis   . OSA (obstructive sleep apnea)   . Sleep apnea     uses cpap  . Headache   . Chronic pain     Past Surgical History  Procedure Laterality Date  . No past surgeries      Family History  Problem Relation Age of Onset  . Hypertension Mother   . Diabetes Mother   . Hypertension Father   . Diabetes Father   . Diabetes Brother   . Hypertension Brother   . Colon cancer Sister     History  Substance Use Topics  . Smoking status: Former Smoker -- 0.50 packs/day for 24 years    Types: Cigarettes    Start date: 06/20/1987    Quit date: 12/18/2011  . Smokeless tobacco: Never Used  . Alcohol Use: No     Review of Systems  Constitutional: Negative for fever.  Gastrointestinal: Positive for abdominal pain and diarrhea. Negative for vomiting and blood in stool.  All other systems reviewed and are negative.     Allergies  Sulfa  antibiotics  Home Medications   Prior to Admission medications   Medication Sig Start Date End Date Taking? Authorizing Provider  aspirin EC 325 MG tablet Take 325 mg by mouth daily.    Historical Provider, MD  budesonide-formoterol (SYMBICORT) 80-4.5 MCG/ACT inhaler Take 2 puffs first thing in am and then another 2 puffs about 12 hours later. 11/04/13   Tanda Rockers, MD  carvedilol (COREG) 12.5 MG tablet Take 12.5 mg by mouth 2 (two) times daily with a meal.    Historical Provider, MD  Choline Fenofibrate (TRILIPIX) 135 MG capsule Take 135 mg by mouth daily.    Historical Provider, MD  cyanocobalamin (,VITAMIN B-12,) 1000 MCG/ML injection Inject 1,000 mcg into the muscle every 30 (thirty) days.    Historical Provider, MD  famotidine (PEPCID) 20 MG tablet One at bedtime 11/04/13   Tanda Rockers, MD  furosemide (LASIX) 20 MG tablet Take 1 tablet (20 mg total) by mouth daily. 11/02/13   Belkys A Regalado, MD  ibuprofen (ADVIL,MOTRIN) 200 MG tablet Take 400 mg by mouth every 6 (six) hours as needed for headache.    Historical Provider, MD  insulin aspart (NOVOLOG) 100 UNIT/ML injection Inject 10 Units into the skin 3 (three) times daily with meals. 11/02/13  Belkys A Regalado, MD  insulin glargine (LANTUS) 100 UNIT/ML injection Inject 0.15 mLs (15 Units total) into the skin at bedtime. 11/02/13   Belkys A Regalado, MD  latanoprost (XALATAN) 0.005 % ophthalmic solution Place 1 drop into the right eye at bedtime. 09/05/13 09/05/14  Historical Provider, MD  levofloxacin (LEVAQUIN) 750 MG tablet Take 1 tablet (750 mg total) by mouth daily. 11/02/13   Belkys A Regalado, MD  metFORMIN (GLUCOPHAGE) 1000 MG tablet Take 1,000 mg by mouth 2 (two) times daily with a meal.      Historical Provider, MD  oxyCODONE-acetaminophen (PERCOCET/ROXICET) 5-325 MG per tablet Take 1 tablet by mouth every 4 (four) hours as needed for severe pain. 11/02/13   Belkys A Regalado, MD  pantoprazole (PROTONIX) 40 MG tablet Take 1  tablet (40 mg total) by mouth daily. Take 30-60 min before first meal of the day 11/04/13   Tanda Rockers, MD   BP 135/82  Pulse 93  Temp(Src) 98.7 F (37.1 C)  Resp 20  Ht 6\' 1"  (1.854 m)  Wt 412 lb (186.882 kg)  BMI 54.37 kg/m2  SpO2 93%  Physical Exam  Nursing note and vitals reviewed. Constitutional: He is oriented to person, place, and time. He appears well-developed and well-nourished.  Non-toxic appearance. No distress.  HENT:  Head: Normocephalic and atraumatic.  Eyes: Conjunctivae, EOM and lids are normal. Pupils are equal, round, and reactive to light.  Neck: Normal range of motion. Neck supple. No tracheal deviation present. No mass present.  Cardiovascular: Normal rate, regular rhythm and normal heart sounds.  Exam reveals no gallop.   No murmur heard. Pulmonary/Chest: Effort normal and breath sounds normal. No stridor. No respiratory distress. He has no decreased breath sounds. He has no wheezes. He has no rhonchi. He has no rales.  Abdominal: Soft. Normal appearance and bowel sounds are normal. He exhibits no distension. There is no tenderness. There is no rebound and no CVA tenderness.  Incisions healed, no signs of infection  Musculoskeletal: Normal range of motion. He exhibits no edema and no tenderness.  Neurological: He is alert and oriented to person, place, and time. He has normal strength. No cranial nerve deficit or sensory deficit. GCS eye subscore is 4. GCS verbal subscore is 5. GCS motor subscore is 6.  Skin: Skin is warm and dry. No abrasion and no rash noted.  Psychiatric: He has a normal mood and affect. His speech is normal and behavior is normal.    ED Course  Procedures (including critical care time)  DIAGNOSTIC STUDIES: Oxygen Saturation is 93% on room air, adequate by my interpretation.    COORDINATION OF CARE: 7:25 PM-Discussed treatment plan which includes IV fluids, stool culture and labs with pt at bedside and pt agreed to plan.     Labs  Review Labs Reviewed - No data to display  Imaging Review No results found.   EKG Interpretation None      MDM   Final diagnoses:  None    I personally performed the services described in this documentation, which was scribed in my presence. The recorded information has been reviewed and is accurate.  Patient refused IV fluids here. Was given Percocet for pain and feels better. Stool culture obtained and I suspect that he has C. difficile colitis and we'll treat with Flagyl    Shawn Jacobsen, MD 11/09/13 2112

## 2013-11-09 NOTE — ED Notes (Signed)
EDP Allen informed of pt's refusal to have an IV. Informed of pt's request for pain medication for his back. Verbal order received.

## 2013-11-09 NOTE — Discharge Instructions (Signed)
Diarrhea Diarrhea is frequent loose and watery bowel movements. It can cause you to feel weak and dehydrated. Dehydration can cause you to become tired and thirsty, have a dry mouth, and have decreased urination that often is dark yellow. Diarrhea is a sign of another problem, most often an infection that will not last long. In most cases, diarrhea typically lasts 2 3 days. However, it can last longer if it is a sign of something more serious. It is important to treat your diarrhea as directed by your caregive to lessen or prevent future episodes of diarrhea. CAUSES  Some common causes include:  Gastrointestinal infections caused by viruses, bacteria, or parasites.  Food poisoning or food allergies.  Certain medicines, such as antibiotics, chemotherapy, and laxatives.  Artificial sweeteners and fructose.  Digestive disorders. HOME CARE INSTRUCTIONS  Ensure adequate fluid intake (hydration): have 1 cup (8 oz) of fluid for each diarrhea episode. Avoid fluids that contain simple sugars or sports drinks, fruit juices, whole milk products, and sodas. Your urine should be clear or pale yellow if you are drinking enough fluids. Hydrate with an oral rehydration solution that you can purchase at pharmacies, retail stores, and online. You can prepare an oral rehydration solution at home by mixing the following ingredients together:    tsp table salt.   tsp baking soda.   tsp salt substitute containing potassium chloride.  1  tablespoons sugar.  1 L (34 oz) of water.  Certain foods and beverages may increase the speed at which food moves through the gastrointestinal (GI) tract. These foods and beverages should be avoided and include:  Caffeinated and alcoholic beverages.  High-fiber foods, such as raw fruits and vegetables, nuts, seeds, and whole grain breads and cereals.  Foods and beverages sweetened with sugar alcohols, such as xylitol, sorbitol, and mannitol.  Some foods may be well  tolerated and may help thicken stool including:  Starchy foods, such as rice, toast, pasta, low-sugar cereal, oatmeal, grits, baked potatoes, crackers, and bagels.  Bananas.  Applesauce.  Add probiotic-rich foods to help increase healthy bacteria in the GI tract, such as yogurt and fermented milk products.  Wash your hands well after each diarrhea episode.  Only take over-the-counter or prescription medicines as directed by your caregiver.  Take a warm bath to relieve any burning or pain from frequent diarrhea episodes. SEEK IMMEDIATE MEDICAL CARE IF:   You are unable to keep fluids down.  You have persistent vomiting.  You have blood in your stool, or your stools are black and tarry.  You do not urinate in 6 8 hours, or there is only a small amount of very dark urine.  You have abdominal pain that increases or localizes.  You have weakness, dizziness, confusion, or lightheadedness.  You have a severe headache.  Your diarrhea gets worse or does not get better.  You have a fever or persistent symptoms for more than 2 3 days.  You have a fever and your symptoms suddenly get worse. MAKE SURE YOU:   Understand these instructions.  Will watch your condition.  Will get help right away if you are not doing well or get worse. Document Released: 05/26/2002 Document Revised: 05/22/2012 Document Reviewed: 02/11/2012 [employer] Patient Information 2014 [employer].  

## 2013-11-09 NOTE — ED Notes (Signed)
Was inpt here for pneumonia, discharged 5/17. Had abd pain and diarrhea at that time. abd pain and diarrhea persists now. Advised to come in by physician

## 2013-11-11 LAB — GI PATHOGEN PANEL BY PCR, STOOL
C difficile toxin A/B: NEGATIVE
Campylobacter by PCR: NEGATIVE
Cryptosporidium by PCR: NEGATIVE
E COLI (ETEC) LT/ST: NEGATIVE
E COLI 0157 BY PCR: NEGATIVE
E coli (STEC): NEGATIVE
G lamblia by PCR: NEGATIVE
Norovirus GI/GII: NEGATIVE
Rotavirus A by PCR: NEGATIVE
SALMONELLA BY PCR: NEGATIVE
SHIGELLA BY PCR: NEGATIVE

## 2013-11-14 LAB — STOOL CULTURE

## 2014-05-30 ENCOUNTER — Emergency Department (HOSPITAL_COMMUNITY)
Admission: EM | Admit: 2014-05-30 | Discharge: 2014-05-30 | Disposition: A | Discharge status: Home / Self Care | Payer: Medicaid Other

## 2014-08-11 ENCOUNTER — Emergency Department (HOSPITAL_COMMUNITY): Payer: Medicaid Other

## 2014-08-11 ENCOUNTER — Encounter (HOSPITAL_COMMUNITY): Payer: Self-pay | Admitting: *Deleted

## 2014-08-11 ENCOUNTER — Emergency Department (HOSPITAL_COMMUNITY)
Admission: EM | Admit: 2014-08-11 | Discharge: 2014-08-11 | Disposition: A | Discharge status: Home / Self Care | Payer: Medicaid Other | Attending: Emergency Medicine | Admitting: Emergency Medicine

## 2014-08-11 DIAGNOSIS — Z872 Personal history of diseases of the skin and subcutaneous tissue: Secondary | ICD-10-CM | POA: Insufficient documentation

## 2014-08-11 DIAGNOSIS — Z79899 Other long term (current) drug therapy: Secondary | ICD-10-CM | POA: Diagnosis not present

## 2014-08-11 DIAGNOSIS — Z9981 Dependence on supplemental oxygen: Secondary | ICD-10-CM | POA: Diagnosis not present

## 2014-08-11 DIAGNOSIS — G4730 Sleep apnea, unspecified: Secondary | ICD-10-CM | POA: Insufficient documentation

## 2014-08-11 DIAGNOSIS — J45909 Unspecified asthma, uncomplicated: Secondary | ICD-10-CM | POA: Insufficient documentation

## 2014-08-11 DIAGNOSIS — Z7982 Long term (current) use of aspirin: Secondary | ICD-10-CM | POA: Diagnosis not present

## 2014-08-11 DIAGNOSIS — G8929 Other chronic pain: Secondary | ICD-10-CM | POA: Insufficient documentation

## 2014-08-11 DIAGNOSIS — E119 Type 2 diabetes mellitus without complications: Secondary | ICD-10-CM | POA: Insufficient documentation

## 2014-08-11 DIAGNOSIS — Z87891 Personal history of nicotine dependence: Secondary | ICD-10-CM | POA: Diagnosis not present

## 2014-08-11 DIAGNOSIS — I10 Essential (primary) hypertension: Secondary | ICD-10-CM | POA: Insufficient documentation

## 2014-08-11 DIAGNOSIS — R51 Headache: Secondary | ICD-10-CM | POA: Insufficient documentation

## 2014-08-11 DIAGNOSIS — R519 Headache, unspecified: Secondary | ICD-10-CM

## 2014-08-11 MED ORDER — HYDROCODONE-ACETAMINOPHEN 5-325 MG PO TABS
1.0000 | ORAL_TABLET | Freq: Once | ORAL | Status: AC
  Administered 2014-08-11: 1 via ORAL
  Filled 2014-08-11: qty 1

## 2014-08-11 MED ORDER — HYDROCODONE-ACETAMINOPHEN 5-325 MG PO TABS: 1.0000 | ORAL_TABLET | Freq: Four times a day (QID) | ORAL | Status: DC | PRN

## 2014-08-11 NOTE — ED Provider Notes (Signed)
CSN: [id number]     Arrival date & time 08/11/14  1607 History   First MD Initiated Contact with Patient 08/11/14 1619     Chief Complaint  Patient presents with  . Headache     (Consider location/radiation/quality/duration/timing/severity/associated sxs/prior Treatment) Patient is a 44 y.o. male presenting with headaches. The history is provided by the patient.  Headache Associated symptoms: no abdominal pain, no congestion, no fever, no myalgias, no nausea, no neck stiffness, no numbness, no vomiting and no weakness    Patient with complaint of headache on and off for about 2 weeks. More consistent lately. 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 Patient also uses a  c pap at home. But not able to have it functioning. Patient has been sleeping without it. Patient does have diagnosed sleep apnea. This could be contributing to his headaches. Patient without any neuro focal deficits. Patient without fever. Patient not neck stiffness. No visual changes. Past Medical History  Diagnosis Date  . Hypertension   . Diabetes mellitus   . Abscess   . Chronic back pain   . Sciatica   . Asthma   . Bronchitis   . OSA (obstructive sleep apnea)   . Sleep apnea     uses cpap  . Headache   . Chronic pain    Past Surgical History  Procedure Laterality Date  . No past surgeries     Family History  Problem Relation Age of Onset  . Hypertension Mother   . Diabetes Mother   . Hypertension Father   . Diabetes Father   . Diabetes Brother   . Hypertension Brother   . Colon cancer Sister    History  Substance Use Topics  . Smoking status: Former Smoker -- 0.50 packs/day for 24 years    Types: Cigarettes    Start date: 06/20/1987    Quit date: 12/18/2011  . Smokeless tobacco: Never Used  . Alcohol Use: No    Review of Systems  Constitutional: Negative for fever.  HENT: Negative for congestion.    Eyes: Negative for visual disturbance.  Respiratory: Positive for shortness of breath.   Cardiovascular: Negative for chest pain.  Gastrointestinal: Negative for nausea, vomiting and abdominal pain.  Genitourinary: Negative for dysuria.  Musculoskeletal: Negative for myalgias and neck stiffness.  Skin: Negative for rash.  Neurological: Positive for headaches. Negative for speech difficulty, weakness and numbness.  Hematological: Does not bruise/bleed easily.  Psychiatric/Behavioral: Negative for confusion.      Allergies  Sulfa antibiotics  Home Medications   Prior to Admission medications   Medication Sig Start Date End Date Taking? Authorizing Provider  amLODipine (NORVASC) 10 MG tablet Take 10 mg by mouth daily. 08/06/14  Yes Historical Provider, MD  aspirin EC 81 MG tablet Take 81 mg by mouth daily.   Yes Historical Provider, MD  budesonide-formoterol (SYMBICORT) 160-4.5 MCG/ACT inhaler Inhale 2 puffs into the lungs daily as needed (for shortness of breath and wheezing).   Yes Historical Provider, MD  carvedilol (COREG) 12.5 MG tablet Take 12.5 mg by mouth 2 (two) times daily with a meal.   Yes Historical Provider, MD  Choline Fenofibrate (TRILIPIX) 135 MG capsule Take 135 mg by mouth daily.   Yes Historical Provider, MD  cyanocobalamin (,VITAMIN B-12,) 1000 MCG/ML injection Inject 1,000 mcg into the muscle every 30 (thirty) days.   Yes Historical Provider, MD  famotidine (  PEPCID) 20 MG tablet Take 20 mg by mouth at bedtime.   Yes Historical Provider, MD  furosemide (LASIX) 20 MG tablet Take 1 tablet (20 mg total) by mouth daily. 11/02/13  Yes Belkys A Regalado, MD  ibuprofen (ADVIL,MOTRIN) 200 MG tablet Take 400 mg by mouth every 6 (six) hours as needed for headache.   Yes Historical Provider, MD  insulin aspart (NOVOLOG) 100 UNIT/ML injection Inject 10 Units into the skin 3 (three) times daily with meals. 11/02/13  Yes Belkys A Regalado, MD  insulin glargine (LANTUS) 100 UNIT/ML  injection Inject 0.15 mLs (15 Units total) into the skin at bedtime. Patient taking differently: Inject 30 Units into the skin at bedtime.  11/02/13  Yes Belkys A Regalado, MD  latanoprost (XALATAN) 0.005 % ophthalmic solution Place 1 drop into the right eye at bedtime. 09/05/13 09/05/14 Yes Historical Provider, MD  metFORMIN (GLUCOPHAGE) 1000 MG tablet Take 1,000 mg by mouth 2 (two) times daily with a meal.     Yes Historical Provider, MD  pantoprazole (PROTONIX) 40 MG tablet Take 1 tablet (40 mg total) by mouth daily. Take 30-60 min before first meal of the day 11/04/13  Yes Tanda Rockers, MD  pravastatin (PRAVACHOL) 20 MG tablet Take 20 mg by mouth every evening.   Yes Historical Provider, MD  ALPRAZolam Duanne Moron) 1 MG tablet Take 1 mg by mouth 3 (three) times daily as needed. 05/15/14   Historical Provider, MD  HYDROcodone-acetaminophen (NORCO) 10-325 MG per tablet Take 1 tablet by mouth every 6 (six) hours as needed for moderate pain.  07/01/14   Historical Provider, MD  HYDROcodone-acetaminophen (NORCO/VICODIN) 5-325 MG per tablet Take 1-2 tablets by mouth every 6 (six) hours as needed. 08/11/14   [name], MD  metroNIDAZOLE (FLAGYL) 500 MG tablet Take 1 tablet (500 mg total) by mouth 2 (two) times daily. Patient not taking: Reported on 08/11/2014 11/09/13   [name], MD   BP 105/63 mmHg  Pulse 100  Temp(Src) 98.2 F (36.8 C) (Oral)  Resp 22  Ht 6\' 1"  (1.854 m)  Wt 413 lb (187.336 kg)  BMI 54.50 kg/m2  SpO2 93% Physical Exam  Constitutional: He is oriented to person, place, and time. He appears well-developed and well-nourished. No distress.  HENT:  Head: Normocephalic and atraumatic.  Mouth/Throat: Oropharynx is clear and moist.  Eyes: Conjunctivae and EOM are normal. Pupils are equal, round, and reactive to light.  Neck: Normal range of motion.  Cardiovascular: Normal rate and regular rhythm.   Pulmonary/Chest: Effort normal and breath sounds normal.  Abdominal: Soft. Bowel  sounds are normal. There is no tenderness.  Musculoskeletal: Normal range of motion.  Neurological: He is alert and oriented to person, place, and time. He exhibits normal muscle tone. Coordination normal.  Skin: Skin is warm. No rash noted.  Nursing note and vitals reviewed.   ED Course  Procedures (including critical care time) Labs Review Labs Reviewed - No data to display  Imaging Review Ct Head Wo Contrast  08/11/2014   CLINICAL DATA:  Headache for 1 week without known injury.  EXAM: CT HEAD WITHOUT CONTRAST  TECHNIQUE: Contiguous axial images were obtained from the base of the skull through the vertex without intravenous contrast.  COMPARISON:  CT scan of March 20, 2014.  FINDINGS: Bony calvarium appears intact. No mass effect or midline shift is noted. Ventricular size is within normal limits. There is no evidence of mass lesion, hemorrhage or acute infarction.  IMPRESSION: Normal head CT.  Electronically Signed   By: Marijo Conception, M.D.   On: 08/11/2014 18:37     EKG Interpretation None      MDM   Final diagnoses:  Headache    Patient with a headache on and off for a couple weeks. Worse lately. Workup for the headache without any significant abnormalities on head CT. Not concerned about subarachnoid hemorrhage. Patient improved here with pain medicine. Patient is on C Pap and has not been working properly this could be contributing to his headaches. Resource guide provided for patient to find a new regular doctor also recommended following up with the health department. Patient is stable for discharge home.    [name], MD 08/11/14 1850

## 2014-08-11 NOTE — ED Notes (Signed)
Pt verbalized understanding of no driving and to use caution within 4 hours of taking pain meds due to meds cause drowsiness 

## 2014-08-11 NOTE — ED Notes (Signed)
Pt's RA sats 89-90% while sitting on side of bed, O2 applied via [place] at 2L/M and sats increased to 95-96%

## 2014-08-11 NOTE — ED Notes (Addendum)
Headache for 1 week, No injury, says he had hx of "fatty tumor' frontal  Area. On 02 prn  [place] "few days ago" and hurt lower back

## 2014-08-11 NOTE — Discharge Instructions (Signed)
Resource guide provided below to help you find follow-up for your sleep apnea concerns. CT of the head negative shows no significant abnormalities. Take pain medicine as directed.   Emergency Department Resource Guide 1) Find a Doctor and Pay Out of Pocket Although you won't have to find out who is covered by your insurance plan, it is a good idea to ask around and get recommendations. You will then need to call the office and see if the doctor you have chosen will accept you as a new patient and what types of options they offer for patients who are self-pay. Some doctors offer discounts or will set up payment plans for their patients who do not have insurance, but you will need to ask so you aren't surprised when you get to your appointment.  2) Contact Your Local Health Department Not all health departments have doctors that can see patients for sick visits, but many do, so it is worth a call to see if yours does. If you don't know where your local health department is, you can check in your phone book. The CDC also has a tool to help you locate your state's health department, and many state websites also have listings of all of their local health departments.  3) Find a [facility] If your illness is not likely to be very severe or complicated, you may want to try a walk in clinic. These are popping up all over the country in pharmacies, drugstores, and shopping centers. They're usually staffed by nurse practitioners or physician assistants that have been trained to treat common illnesses and complaints. They're usually fairly quick and inexpensive. However, if you have serious medical issues or chronic medical problems, these are probably not your best option.  No Primary Care Doctor: - Call Health Connect at  [phone] - they can help you locate a primary care doctor that  accepts your insurance, provides certain services, etc. - Physician Referral Service- [phone]  Chronic Pain  Problems: Organization         Address  Phone   Notes  [facility]  [phone] Patients need to be referred by their primary care doctor.   Medication Assistance: Organization         Address  Phone   Notes  [facility] Medication [facility] [address] [place], [place] [address] [phone] --Must be a resident of [facility] -- Must have NO insurance coverage whatsoever (no Medicaid/ Medicare, etc.) -- The pt. MUST have a primary care doctor that directs their care regularly and follows them in the community   MedAssist  [phone]   [employer]  [phone]    Agencies that provide inexpensive medical care: Organization         Address  Phone   Notes  [place]  [phone]   [facility]    [phone]   [facility] [place],  [phone]   [place] [address] [place] [phone]   Planned Parenthood    [phone]   [facility]    [phone]   [place] and [place] [address],  Phone:  [phone], Fax:  [phone] Hours of Operation:  9 am - 6 pm, M-F.  Also accepts Medicaid/Medicare and self-pay.  [facility] for [place] [address] [place],  [address], [place] Phone: [phone], Fax: [phone]. Hours of Operation:  8:30 am - 5:30 pm, M-F.  Also accepts Medicaid and self-pay.  [facility] [place] [address] [place] Phone: [phone]   [place], [place], [place] [phone], Ext. [phone] Mondays & Thursdays: 7-9 AM.  First 15 patients are seen on a first come, first serve basis.    [place] Providers:  Organization         Address  Phone   Notes  [facility] [address], [place] [phone] Also  accepts self-pay patients.  [facility] [address], [place]  [phone]   [place], [address], [place] [phone]   [facility] [address] [place] [phone]   Lucianne Lei [address], [place]   [phone] Only accepts [place] Access [place] patients after they have their name applied to their card.   Self-Pay (no insurance) in [facility]:  Organization         Address  Phone   Notes  Sickle Cell Patients, [facility] [place] [phone]   [facility] [place] [phone]   [facility] [place]  [place], [place], [place] [phone]   [facility]/Dr. Osei-Bonsu  [address] [place] [address], [place] or [address], [place] [phone] Phone number for both [place] and [place] locations is the same.  [facility] [address] [place] [phone]   [facility] [address], [place] or [address] [phone]   [facility] [address], [place] [phone], phone; [phone], fax Sees patients 1st and 3rd Saturday of every month.  Must not qualify for public or private insurance (i.e. Medicaid, Medicare, [place] Health Choice, Veterans' Benefits)  Household income should be no more than 200% of the poverty level The clinic cannot treat you if you are pregnant or think you are pregnant  Sexually transmitted diseases are not treated at the clinic.    Dental Care: Organization         Address  Phone  Notes  [facility] Department of [facility] [place] [phone] Accepts children up to age 14 who are enrolled in [place] or [place]; pregnant  women with a Medicaid card; and children who have applied for Medicaid or [place] Health Choice, but were declined, whose parents can pay a reduced fee at time of service.  [facility] Department of [facility]  [address], [place] [phone] Accepts children up to age 57 who are enrolled in [place] or [place]; pregnant women with a Medicaid card; and children who have applied for Medicaid or [place] Health Choice, but were declined, whose parents can pay a reduced fee at time of service.  [facility] PRO[facility]  [place] [phone] Patients are seen by appointment only. Walk-ins are not accepted. [place] will see patients 65 years of age and older. Monday - Tuesday (8am-5pm) Most Wednesdays (8:30-5pm) $30 per visit, cash only  [place]  [address], [place] [phone] Patients are seen by appointment only. Walk-ins are not accepted. [place] will see patients 47 years of age and older. One Wednesday Evening (Monthly: Volunteer Based).  $30 per visit, cash only  [place]  [phone] for adults; Children under age 20, call Graduate Pediatric Dentistry at [phone]. Children aged 75-14, please call [phone] to request a pediatric application.  Dental services are provided in all areas of dental care including fillings, crowns and bridges, complete and partial dentures, implants, gum treatment, root canals, and extractions. Preventive care is also provided. Treatment is provided to both adults and children. Patients are selected via a lottery and there is often a waiting list.   [facility] [address], [place]  [phone] [email or web address]   [facility] [address] [place], [place] [phone], Ext. [phone] Second and Fourth Thursday of each month, opens at 6:30 AM; Clinic ends at 9 AM.  Patients are  seen on a first-come first-served basis, and a limited number are seen during each clinic.   [facility]  [address] Hillard Danker [place], [place] [phone]   Eligibility Requirements You must have lived in [place], [place], or [place] counties for at least the last three months.   You cannot be eligible for state or federal sponsored [employer], including [employer], [place], or [employer].   You generally cannot be eligible for healthcare insurance through your employer.    How to apply: Eligibility screenings are held every Tuesday and Wednesday afternoon from 1:00 pm until 4:00 pm. You do not need an appointment for the interview!  [facility] [address] [place], [place]   [place]  [place] Department  [place]  [phone]    Behavioral Health Resources in the Community: Intensive Outpatient Programs Organization         Address  Phone  Notes  [place]. [address] [place], [place] [phone]   [facility] [address] [place], [place]   ADS: [facility] [address] [place], [place]   [place] [address]  [place], [place] or [phone]   Substance Abuse Resources Organization         Address  Phone  Notes  Alcohol and Drug Services  [phone]   [place]  [phone]   [facility] [place]   Chinita Pester  [phone]   Residential & Outpatient Substance Abuse Program  [phone]   Psychological Services Organization         Address  Phone  Notes  [facility] [place]  [place]  [phone]   [place] [address] [place] or [phone]    Mobile Crisis  Teams Organization         Address  Phone  Notes  [facility], Mobile Crisis Care Unit  [phone]   [facility]  [address] [place], [place]   [place] [address] [place] [phone]    Self-Help/Support Groups Organization         Address  Phone             Notes  [place]. of [place] -  variety of support groups  [phone] Call for more information  Narcotics Anonymous (NA), [facility] [address], [employer] [place]  2 meetings at this location   Residential Facilities manager         Address  Phone  Notes  [facility] [place],    [place]  [phone]   [facility]  [address] [place] 623762, [place], [place]   [place], [place] [phone] Admissions: 8am-3pm M-F  Incentives Substance [place] [address],    [place], [place] [phone]   [facility] [address] [place], [place], [place]   [facility] [address]  [place], [place]   [facility] Programs - Intensive Outpatient [address] Kristeen [address], [place], [place]   [facility] [place] [address]  [place], [place] [phone] or [phone]   [facility] Services (RTS) [address] [place], [place] Accepts Medicaid  [facility] [place] [address],  [place] [phone] Substance Abuse/Addiction Treatment   [facility] Organization         Address  Phone  Notes  [facility]  [phone]   Domenic Schwab, PhD [address] [place], [place]   [phone] or [phone]   [place], [place] [phone]   [facility] [address] [place], [place] [phone]  Insurance/Medicaid/sponsorship through [facility] [employer] and Families [address]                                    [place], [place] [phone] [place] [address], [place] [phone]    Dr. Adele Schilder  [phone]   [facility] Dept. 1) [address] [place] 2) [place] 3)  [place] [address], [place] [phone]  [phone]   [place] [phone] or [phone] (After Hours)

## 2014-09-17 ENCOUNTER — Emergency Department (HOSPITAL_COMMUNITY)
Admission: EM | Admit: 2014-09-17 | Discharge: 2014-09-17 | Disposition: A | Discharge status: Home / Self Care | Payer: Medicaid Other | Attending: Emergency Medicine | Admitting: Emergency Medicine

## 2014-09-17 ENCOUNTER — Encounter (HOSPITAL_COMMUNITY): Payer: Self-pay | Admitting: Emergency Medicine

## 2014-09-17 DIAGNOSIS — Z9981 Dependence on supplemental oxygen: Secondary | ICD-10-CM | POA: Diagnosis not present

## 2014-09-17 DIAGNOSIS — Z87891 Personal history of nicotine dependence: Secondary | ICD-10-CM | POA: Diagnosis not present

## 2014-09-17 DIAGNOSIS — G8929 Other chronic pain: Secondary | ICD-10-CM | POA: Insufficient documentation

## 2014-09-17 DIAGNOSIS — Z8739 Personal history of other diseases of the musculoskeletal system and connective tissue: Secondary | ICD-10-CM | POA: Insufficient documentation

## 2014-09-17 DIAGNOSIS — J45909 Unspecified asthma, uncomplicated: Secondary | ICD-10-CM | POA: Insufficient documentation

## 2014-09-17 DIAGNOSIS — G4730 Sleep apnea, unspecified: Secondary | ICD-10-CM | POA: Insufficient documentation

## 2014-09-17 DIAGNOSIS — Z794 Long term (current) use of insulin: Secondary | ICD-10-CM | POA: Diagnosis not present

## 2014-09-17 DIAGNOSIS — L02411 Cutaneous abscess of right axilla: Secondary | ICD-10-CM | POA: Diagnosis present

## 2014-09-17 DIAGNOSIS — E119 Type 2 diabetes mellitus without complications: Secondary | ICD-10-CM | POA: Insufficient documentation

## 2014-09-17 DIAGNOSIS — I10 Essential (primary) hypertension: Secondary | ICD-10-CM | POA: Diagnosis not present

## 2014-09-17 DIAGNOSIS — Z7982 Long term (current) use of aspirin: Secondary | ICD-10-CM | POA: Diagnosis not present

## 2014-09-17 DIAGNOSIS — Z792 Long term (current) use of antibiotics: Secondary | ICD-10-CM | POA: Diagnosis not present

## 2014-09-17 DIAGNOSIS — Z79899 Other long term (current) drug therapy: Secondary | ICD-10-CM | POA: Insufficient documentation

## 2014-09-17 MED ORDER — DOXYCYCLINE HYCLATE 100 MG PO TABS
100.0000 mg | ORAL_TABLET | Freq: Once | ORAL | Status: AC
  Administered 2014-09-17: 100 mg via ORAL
  Filled 2014-09-17: qty 1

## 2014-09-17 MED ORDER — HYDROCODONE-ACETAMINOPHEN 5-325 MG PO TABS
1.0000 | ORAL_TABLET | ORAL | Status: DC | PRN
Start: 2014-09-17 — End: 2015-04-15

## 2014-09-17 MED ORDER — LIDOCAINE HCL (PF) 1 % IJ SOLN
5.0000 mL | Freq: Once | INTRAMUSCULAR | Status: DC
  Filled 2014-09-17: qty 5

## 2014-09-17 MED ORDER — HYDROCODONE-ACETAMINOPHEN 5-325 MG PO TABS
1.0000 | ORAL_TABLET | Freq: Once | ORAL | Status: AC
  Administered 2014-09-17: 1 via ORAL
  Filled 2014-09-17: qty 1

## 2014-09-17 MED ORDER — DOXYCYCLINE HYCLATE 100 MG PO CAPS: 100.0000 mg | ORAL_CAPSULE | Freq: Two times a day (BID) | ORAL | Status: DC

## 2014-09-17 NOTE — Discharge Instructions (Signed)

## 2014-09-17 NOTE — ED Provider Notes (Signed)
CSN: [id number]     Arrival date & time 09/17/14  1613 History   First MD Initiated Contact with Patient 09/17/14 1622     Chief Complaint  Patient presents with  . Abscess     (Consider location/radiation/quality/duration/timing/severity/associated sxs/prior Treatment) Patient is a 44 y.o. male presenting with abscess. The history is provided by the patient.  Abscess Location:  Shoulder/arm and torso Shoulder/arm abscess location:  R axilla Torso abscess location:  L chest Size:  2 cm Abscess quality: fluctuance, induration, painful and redness   Abscess quality: not draining   Red streaking: no   Duration:  1 week Progression:  Worsening Pain details:    Quality:  Sharp and shooting   Severity:  Moderate   Timing:  Constant   Progression:  Worsening Chronicity:  Recurrent (right axilla is recurrent.  Left breast is new) Relieved by:  None tried Worsened by:  Nothing tried Ineffective treatments:  None tried Associated symptoms: no fever, no headaches and no nausea   Risk factors: prior abscess     Past Medical History  Diagnosis Date  . Hypertension   . Diabetes mellitus   . Abscess   . Chronic back pain   . Sciatica   . Asthma   . Bronchitis   . OSA (obstructive sleep apnea)   . Sleep apnea     uses cpap  . Headache   . Chronic pain    Past Surgical History  Procedure Laterality Date  . No past surgeries     Family History  Problem Relation Age of Onset  . Hypertension Mother   . Diabetes Mother   . Hypertension Father   . Diabetes Father   . Diabetes Brother   . Hypertension Brother   . Colon cancer Sister    History  Substance Use Topics  . Smoking status: Former Smoker -- 0.50 packs/day for 24 years    Types: Cigarettes    Start date: 06/20/1987    Quit date: 12/18/2011  . Smokeless tobacco: Never Used  . Alcohol Use: No    Review of Systems  Constitutional: Negative for fever.  HENT: Negative for congestion and sore throat.   Eyes:  Negative.   Respiratory: Negative for chest tightness and shortness of breath.   Cardiovascular: Negative for chest pain.  Gastrointestinal: Negative for nausea and abdominal pain.  Genitourinary: Negative.   Musculoskeletal: Negative for joint swelling, arthralgias and neck pain.  Skin: Positive for color change. Negative for rash and wound.  Neurological: Negative for dizziness, weakness, light-headedness, numbness and headaches.  Psychiatric/Behavioral: Negative.       Allergies  Sulfa antibiotics  Home Medications   Prior to Admission medications   Medication Sig Start Date End Date Taking? Authorizing Provider  amLODipine (NORVASC) 10 MG tablet Take 10 mg by mouth daily. 08/06/14  Yes Historical Provider, MD  aspirin EC 81 MG tablet Take 81 mg by mouth daily.   Yes Historical Provider, MD  atorvastatin (LIPITOR) 20 MG tablet Take 20 mg by mouth at bedtime. 09/01/14  Yes Historical Provider, MD  carvedilol (COREG) 12.5 MG tablet Take 12.5 mg by mouth 2 (two) times daily with a meal.   Yes Historical Provider, MD  [place] Fenofibrate (TRILIPIX) 135 MG capsule Take 135 mg by mouth daily.   Yes Historical Provider, MD  famotidine (PEPCID) 20 MG tablet Take 20 mg by mouth at bedtime.   Yes Historical Provider, MD  furosemide (LASIX) 20 MG tablet Take 1 tablet (20 mg  total) by mouth daily. 11/02/13  Yes Belkys A Regalado, MD  ibuprofen (ADVIL,MOTRIN) 200 MG tablet Take 400 mg by mouth every 6 (six) hours as needed for headache.   Yes Historical Provider, MD  insulin aspart (NOVOLOG) 100 UNIT/ML injection Inject 10 Units into the skin 3 (three) times daily with meals. 11/02/13  Yes Belkys A Regalado, MD  LANTUS SOLOSTAR 100 UNIT/ML Solostar Pen Inject 30 Units into the skin at bedtime. 08/28/14  Yes Historical Provider, MD  latanoprost (XALATAN) 0.005 % ophthalmic solution Place 1 drop into both eyes at bedtime. 08/21/14  Yes Historical Provider, MD  metFORMIN (GLUCOPHAGE) 1000 MG tablet Take  1,000 mg by mouth 2 (two) times daily with a meal.     Yes Historical Provider, MD  pantoprazole (PROTONIX) 40 MG tablet Take 1 tablet (40 mg total) by mouth daily. Take 30-60 min before first meal of the day 11/04/13  Yes Tanda Rockers, MD  ALPRAZolam Duanne Moron) 1 MG tablet Take 1 mg by mouth 3 (three) times daily as needed. 05/15/14   Historical Provider, MD  budesonide-formoterol (SYMBICORT) 160-4.5 MCG/ACT inhaler Inhale 2 puffs into the lungs daily as needed (for shortness of breath and wheezing).    Historical Provider, MD  cyanocobalamin (,VITAMIN B-12,) 1000 MCG/ML injection Inject 1,000 mcg into the muscle every 30 (thirty) days.    Historical Provider, MD  doxycycline (VIBRAMYCIN) 100 MG capsule Take 1 capsule (100 mg total) by mouth 2 (two) times daily. 09/17/14   Evalee Jefferson, PA-C  HYDROcodone-acetaminophen (NORCO/VICODIN) 5-325 MG per tablet Take 1 tablet by mouth every 4 (four) hours as needed. 09/17/14   Evalee Jefferson, PA-C  insulin glargine (LANTUS) 100 UNIT/ML injection Inject 0.15 mLs (15 Units total) into the skin at bedtime. Patient not taking: Reported on 09/17/2014 11/02/13   Belkys A Regalado, MD  metroNIDAZOLE (FLAGYL) 500 MG tablet Take 1 tablet (500 mg total) by mouth 2 (two) times daily. Patient not taking: Reported on 08/11/2014 11/09/13   Lacretia Leigh, MD   BP 110/62 mmHg  Pulse 88  Temp(Src) 98.6 F (37 C) (Oral)  Resp 20  Ht 6\' 1"  (1.854 m)  Wt 404 lb 1.6 oz (183.299 kg)  BMI 53.33 kg/m2  SpO2 98% Physical Exam  Constitutional: He is oriented to person, place, and time. He appears well-developed and well-nourished.  HENT:  Head: Normocephalic.  Cardiovascular: Normal rate.   Pulmonary/Chest: Effort normal.  Neurological: He is alert and oriented to person, place, and time. No sensory deficit.  Skin:  2 cm abscess right axilla, fluctuant without drainage.  2 cm surrounding erythema.  No red streaking. Small papule left breast, non draining.    ED Course  Procedures  (including critical care time)  INCISION AND DRAINAGE Performed by: Evalee Jefferson Consent: Verbal consent obtained. Risks and benefits: risks, benefits and alternatives were discussed Type: abscess  Body area: right axilla  Anesthesia: local infiltration  Incision was made with a scalpel.  Local anesthetic: lidocaine 1% without epinephrine  Anesthetic total: 3 ml  Complexity: complex Blunt dissection to break up loculations  Drainage: purulent  Drainage amount: small amount of pus, brb.  Packing material: none Patient tolerance: Patient tolerated the procedure well with no immediate complications.    Labs Review Labs Reviewed - No data to display  Imaging Review No results found.   EKG Interpretation None      MDM   Final diagnoses:  Abscess of axilla, right    Pt placed on doxycycline, hydrocodone prescribed,  advised warm compresses, recheck by pcp or return here for any worsened sx.  The patient appears reasonably screened and/or stabilized for discharge and I doubt any other medical condition or other [facility] requiring further screening, evaluation, or treatment in the ED at this time prior to discharge.     Evalee Jefferson, PA-C 09/17/14 1824  Milton Ferguson, MD 09/20/14 [phone]

## 2014-09-17 NOTE — ED Notes (Signed)
Pt with mult abscesses. Has hx of same.

## 2014-09-24 ENCOUNTER — Encounter (HOSPITAL_BASED_OUTPATIENT_CLINIC_OR_DEPARTMENT_OTHER): Payer: Medicaid Other

## 2014-10-13 ENCOUNTER — Encounter (HOSPITAL_COMMUNITY): Payer: Self-pay | Admitting: Emergency Medicine

## 2014-10-13 ENCOUNTER — Emergency Department (HOSPITAL_COMMUNITY)
Admission: EM | Admit: 2014-10-13 | Discharge: 2014-10-13 | Disposition: A | Discharge status: Home / Self Care | Payer: Medicaid Other | Attending: Emergency Medicine | Admitting: Emergency Medicine

## 2014-10-13 ENCOUNTER — Emergency Department (HOSPITAL_COMMUNITY): Payer: Medicaid Other

## 2014-10-13 DIAGNOSIS — M545 Low back pain, unspecified: Secondary | ICD-10-CM

## 2014-10-13 DIAGNOSIS — G8929 Other chronic pain: Secondary | ICD-10-CM | POA: Diagnosis not present

## 2014-10-13 DIAGNOSIS — I10 Essential (primary) hypertension: Secondary | ICD-10-CM | POA: Diagnosis not present

## 2014-10-13 DIAGNOSIS — J45909 Unspecified asthma, uncomplicated: Secondary | ICD-10-CM | POA: Diagnosis not present

## 2014-10-13 DIAGNOSIS — Z7982 Long term (current) use of aspirin: Secondary | ICD-10-CM | POA: Insufficient documentation

## 2014-10-13 DIAGNOSIS — Z792 Long term (current) use of antibiotics: Secondary | ICD-10-CM | POA: Insufficient documentation

## 2014-10-13 DIAGNOSIS — R0789 Other chest pain: Secondary | ICD-10-CM

## 2014-10-13 DIAGNOSIS — E119 Type 2 diabetes mellitus without complications: Secondary | ICD-10-CM | POA: Diagnosis not present

## 2014-10-13 DIAGNOSIS — Z872 Personal history of diseases of the skin and subcutaneous tissue: Secondary | ICD-10-CM | POA: Insufficient documentation

## 2014-10-13 DIAGNOSIS — Z79899 Other long term (current) drug therapy: Secondary | ICD-10-CM | POA: Diagnosis not present

## 2014-10-13 DIAGNOSIS — Z794 Long term (current) use of insulin: Secondary | ICD-10-CM | POA: Insufficient documentation

## 2014-10-13 DIAGNOSIS — Z87891 Personal history of nicotine dependence: Secondary | ICD-10-CM | POA: Diagnosis not present

## 2014-10-13 DIAGNOSIS — R079 Chest pain, unspecified: Secondary | ICD-10-CM | POA: Diagnosis present

## 2014-10-13 DIAGNOSIS — E785 Hyperlipidemia, unspecified: Secondary | ICD-10-CM | POA: Diagnosis not present

## 2014-10-13 DIAGNOSIS — Z9981 Dependence on supplemental oxygen: Secondary | ICD-10-CM | POA: Diagnosis not present

## 2014-10-13 DIAGNOSIS — G4733 Obstructive sleep apnea (adult) (pediatric): Secondary | ICD-10-CM | POA: Diagnosis not present

## 2014-10-13 LAB — BASIC METABOLIC PANEL
Anion gap: 9 (ref 5–15)
BUN: 10 mg/dL (ref 6–23)
CHLORIDE: 103 mmol/L (ref 96–112)
CO2: 28 mmol/L (ref 19–32)
CREATININE: 0.67 mg/dL (ref 0.50–1.35)
Calcium: 9.1 mg/dL (ref 8.4–10.5)
GFR calc Af Amer: >90 mL/min (ref >90)
Glucose, Bld: 157 mg/dL — ABNORMAL HIGH (ref 70–99)
Potassium: 3.6 mmol/L (ref 3.5–5.1)
Sodium: 140 mmol/L (ref 135–145)

## 2014-10-13 LAB — CBC
HEMATOCRIT: 47.2 % (ref 39.0–52.0)
Hemoglobin: 14.8 g/dL (ref 13.0–17.0)
MCH: 27.5 pg (ref 26.0–34.0)
MCHC: 31.4 g/dL (ref 30.0–36.0)
MCV: 87.7 fL (ref 78.0–100.0)
Platelets: 272 10*3/uL (ref 150–400)
RBC: 5.38 MIL/uL (ref 4.22–5.81)
RDW: 13.8 % (ref 11.5–15.5)
WBC: 7.3 10*3/uL (ref 4.0–10.5)

## 2014-10-13 LAB — TROPONIN I
Troponin I: <0.03 ng/mL (ref <0.031)
Troponin I: <0.03 ng/mL (ref <0.031)

## 2014-10-13 MED ORDER — OXYCODONE HCL 5 MG PO TABS
5.0000 mg | ORAL_TABLET | Freq: Four times a day (QID) | ORAL | Status: DC | PRN
Start: 2014-10-13 — End: 2015-04-15

## 2014-10-13 MED ORDER — HYDROCODONE-ACETAMINOPHEN 5-325 MG PO TABS
2.0000 | ORAL_TABLET | Freq: Once | ORAL | Status: AC
  Administered 2014-10-13: 2 via ORAL
  Filled 2014-10-13: qty 2

## 2014-10-13 MED ORDER — HYDROCODONE-ACETAMINOPHEN 5-325 MG PO TABS: 2.0000 | ORAL_TABLET | Freq: Once | ORAL | Status: DC

## 2014-10-13 NOTE — ED Notes (Signed)
Patient complaining of chest pain x 2 days.

## 2014-10-13 NOTE — ED Notes (Signed)
Pt's 02 sats on room air were 88% at bedside. Pt states he "holds his breath and that is normal for him". Pt agreed to [facility] but refuses IV. States if he has to be admitted then he will consider having an IV in place.

## 2014-10-13 NOTE — ED Provider Notes (Signed)
TIME SEEN: 1:30 PM  CHIEF COMPLAINT: Chest pain  HPI: Pt is a 44 y.o. male with history of hypertension, diabetes, hyperlipidemia, previous tobacco use, obesity who presents emergency department with 2 days of left-sided sharp chest pain. Denies any radiation of pain. States that last for several seconds and then resolves. It is not exertional or pleuritic pain is worse with movement of his chest and left arm. Better with staying still. Denies shortness of breath, nausea or vomiting, diaphoresis or dizziness. Denies a prior history of CAD, PE or DVT. No history of stress test.   Also complaining of lower back pain without numbness or tingling of his injury. No bowel or bladder incontinence. No fever.  ROS: See HPI Constitutional: no fever  Eyes: no drainage  ENT: no runny nose   Cardiovascular:   chest pain  Resp: no SOB  GI: no vomiting GU: no dysuria Integumentary: no rash  Allergy: no hives  Musculoskeletal: no leg swelling  Neurological: no slurred speech ROS otherwise negative  PAST MEDICAL HISTORY/PAST SURGICAL HISTORY:  Past Medical History  Diagnosis Date  . Hypertension   . Diabetes mellitus   . Abscess   . Chronic back pain   . Sciatica   . Asthma   . Bronchitis   . OSA (obstructive sleep apnea)   . Sleep apnea     uses cpap  . Headache   . Chronic pain     MEDICATIONS:  Prior to Admission medications   Medication Sig Start Date End Date Taking? Authorizing Provider  ALPRAZolam Duanne Moron) 1 MG tablet Take 1 mg by mouth 3 (three) times daily as needed. 05/15/14   Historical Provider, MD  amLODipine (NORVASC) 10 MG tablet Take 10 mg by mouth daily. 08/06/14   Historical Provider, MD  aspirin EC 81 MG tablet Take 81 mg by mouth daily.    Historical Provider, MD  atorvastatin (LIPITOR) 20 MG tablet Take 20 mg by mouth at bedtime. 09/01/14   Historical Provider, MD  budesonide-formoterol (SYMBICORT) 160-4.5 MCG/ACT inhaler Inhale 2 puffs into the lungs daily as needed  (for shortness of breath and wheezing).    Historical Provider, MD  carvedilol (COREG) 12.5 MG tablet Take 12.5 mg by mouth 2 (two) times daily with a meal.    Historical Provider, MD  Choline Fenofibrate (TRILIPIX) 135 MG capsule Take 135 mg by mouth daily.    Historical Provider, MD  cyanocobalamin (,VITAMIN B-12,) 1000 MCG/ML injection Inject 1,000 mcg into the muscle every 30 (thirty) days.    Historical Provider, MD  doxycycline (VIBRAMYCIN) 100 MG capsule Take 1 capsule (100 mg total) by mouth 2 (two) times daily. 09/17/14   Evalee Jefferson, PA-C  famotidine (PEPCID) 20 MG tablet Take 20 mg by mouth at bedtime.    Historical Provider, MD  furosemide (LASIX) 20 MG tablet Take 1 tablet (20 mg total) by mouth daily. 11/02/13   Belkys A Regalado, MD  HYDROcodone-acetaminophen (NORCO/VICODIN) 5-325 MG per tablet Take 1 tablet by mouth every 4 (four) hours as needed. 09/17/14   Evalee Jefferson, PA-C  ibuprofen (ADVIL,MOTRIN) 200 MG tablet Take 400 mg by mouth every 6 (six) hours as needed for headache.    Historical Provider, MD  insulin aspart (NOVOLOG) 100 UNIT/ML injection Inject 10 Units into the skin 3 (three) times daily with meals. 11/02/13   Belkys A Regalado, MD  insulin glargine (LANTUS) 100 UNIT/ML injection Inject 0.15 mLs (15 Units total) into the skin at bedtime. Patient not taking: Reported on 09/17/2014  11/02/13   Belkys A Regalado, MD  LANTUS SOLOSTAR 100 UNIT/ML Solostar Pen Inject 30 Units into the skin at bedtime. 08/28/14   Historical Provider, MD  latanoprost (XALATAN) 0.005 % ophthalmic solution Place 1 drop into both eyes at bedtime. 08/21/14   Historical Provider, MD  metFORMIN (GLUCOPHAGE) 1000 MG tablet Take 1,000 mg by mouth 2 (two) times daily with a meal.      Historical Provider, MD  metroNIDAZOLE (FLAGYL) 500 MG tablet Take 1 tablet (500 mg total) by mouth 2 (two) times daily. Patient not taking: Reported on 08/11/2014 11/09/13   Lacretia Leigh, MD  pantoprazole (PROTONIX) 40 MG tablet  Take 1 tablet (40 mg total) by mouth daily. Take 30-60 min before first meal of the day 11/04/13   Tanda Rockers, MD    ALLERGIES:  Allergies  Allergen Reactions  . Sulfa Antibiotics Rash    SOCIAL HISTORY:  History  Substance Use Topics  . Smoking status: Former Smoker -- 0.50 packs/day for 24 years    Types: Cigarettes    Start date: 06/20/1987    Quit date: 12/18/2011  . Smokeless tobacco: Never Used  . Alcohol Use: No    FAMILY HISTORY: Family History  Problem Relation Age of Onset  . Hypertension Mother   . Diabetes Mother   . Hypertension Father   . Diabetes Father   . Diabetes Brother   . Hypertension Brother   . Colon cancer Sister     EXAM: BP 142/78 mmHg  Pulse 87  Temp(Src) 98.6 F (37 C) (Oral)  Resp 24  Ht 6\' 1"  (1.854 m)  Wt 405 lb (183.707 kg)  BMI 53.44 kg/m2  SpO2 89% CONSTITUTIONAL: Alert and oriented and responds appropriately to questions. Well-appearing; well-nourished, obese HEAD: Normocephalic EYES: Conjunctivae clear, PERRL ENT: normal nose; no rhinorrhea; moist mucous membranes; pharynx without lesions noted NECK: Supple, no meningismus, no LAD  CARD: RRR; S1 and S2 appreciated; no murmurs, no clicks, no rubs, no gallops RESP: Normal chest excursion without splinting or tachypnea; breath sounds clear and equal bilaterally; no wheezes, no rhonchi, no rales, chest wall mildly tender to palpation without crepitus or ecchymosis or deformity ABD/GI: Normal bowel sounds; non-distended; soft, non-tender, no rebound, no guarding BACK:  The back appears normal and is non-tender to palpation, there is no CVA tenderness, no midline spinal tenderness or step-off or deformity EXT: Normal ROM in all joints; non-tender to palpation; no edema; normal capillary refill; no cyanosis, no calf tenderness or swelling    SKIN: Normal color for age and race; warm NEURO: Moves all extremities equally sensation to light touch intact diffusely, normal gait PSYCH:  The patient's mood and manner are appropriate. Grooming and personal hygiene are appropriate.  MEDICAL DECISION MAKING: Patient here with atypical chest pain. He does have multiple risk factors for ACS. EKG shows no ischemic changes. I have low suspicion however that this is ACS but will obtain 2 troponins. Chest x-ray shows no infiltrate, edema.  ED PROGRESS: Patient's second troponin is negative. He does have some very mild hypoxia in the ED when he is laying flat but suspect this is secondary to his obesity and obstructive sleep apnea. When he sits upright his oxygen saturation improves to the mid 90s. No respiratory distress. No shortness of breath. He states he has been told this before. I do not feel that his chest pain is cardiac in nature. I feel he is safe to be discharged with outpatient follow-up. Discussed return precautions. He  verbalizes understanding and is comfortable with plan.     EKG Interpretation  Date/Time:  Tuesday October 13 2014 12:42:44 EDT Ventricular Rate:  86 PR Interval:  144 QRS Duration: 100 QT Interval:  378 QTC Calculation: 452 R Axis:   2 Text Interpretation:  Normal sinus rhythm Incomplete right bundle branch block Possible Anterior infarct , age undetermined Abnormal ECG No significant change since last tracing Confirmed by Sinclair Alligood,  DO, Kaylea Mounsey [phone]) on 10/13/2014 1:08:48 PM        [place], DO 10/13/14 [id number]

## 2014-10-13 NOTE — Discharge Instructions (Signed)
Back Pain, Adult °Low back pain is very common. About 1 in 5 people have back pain. The cause of low back pain is rarely dangerous. The pain often gets better over time. About half of people with a sudden onset of back pain feel better in just 2 weeks. About 8 in 10 people feel better by 6 weeks.  °CAUSES °Some common causes of back pain include: °· Strain of the muscles or ligaments supporting the spine. °· Wear and tear (degeneration) of the spinal discs. °· Arthritis. °· Direct injury to the back. °DIAGNOSIS °Most of the time, the direct cause of low back pain is not known. However, back pain can be treated effectively even when the exact cause of the pain is unknown. Answering your caregiver's questions about your overall health and symptoms is one of the most accurate ways to make sure the cause of your pain is not dangerous. If your caregiver needs more information, he or she may order lab work or imaging tests (X-rays or MRIs). However, even if imaging tests show changes in your back, this usually does not require surgery. °HOME CARE INSTRUCTIONS °For many people, back pain returns. Since low back pain is rarely dangerous, it is often a condition that people can learn to manage on their own.  °· Remain active. It is stressful on the back to sit or stand in one place. Do not sit, drive, or stand in one place for more than 30 minutes at a time. Take short walks on level surfaces as soon as pain allows. Try to increase the length of time you walk each day. °· Do not stay in bed. Resting more than 1 or 2 days can delay your recovery. °· Do not avoid exercise or work. Your body is made to move. It is not dangerous to be active, even though your back may hurt. Your back will likely heal faster if you return to being active before your pain is gone. °· Pay attention to your body when you  bend and lift. Many people have less discomfort when lifting if they bend their knees, keep the load close to their bodies, and  avoid twisting. Often, the most comfortable positions are those that put less stress on your recovering back. °· Find a comfortable position to sleep. Use a firm mattress and lie on your side with your knees slightly bent. If you lie on your back, put a pillow under your knees. °· Only take over-the-counter or prescription medicines as directed by your caregiver. Over-the-counter medicines to reduce pain and inflammation are often the most helpful. Your caregiver may prescribe muscle relaxant drugs. These medicines help dull your pain so you can more quickly return to your normal activities and healthy exercise. °· Put ice on the injured area. °¨ Put ice in a plastic bag. °¨ Place a towel between your skin and the bag. °¨ Leave the ice on for 15-20 minutes, 03-04 times a day for the first 2 to 3 days. After that, ice and heat may be alternated to reduce pain and spasms. °· Ask your caregiver about trying back exercises and gentle massage. This may be of some benefit. °· Avoid feeling anxious or stressed. Stress increases muscle tension and can worsen back pain. It is important to recognize when you are anxious or stressed and learn ways to manage it. Exercise is a great option. °SEEK MEDICAL CARE IF: °· You have pain that is not relieved with rest or medicine. °· You have pain that does not improve in 1 week. °· You have new symptoms. °· You are generally not feeling well. °SEEK   IMMEDIATE MEDICAL CARE IF:  °· You have pain that radiates from your back into your legs. °· You develop new bowel or bladder control problems. °· You have unusual weakness or numbness in your arms or legs. °· You develop nausea or vomiting. °· You develop abdominal pain. °· You feel faint. °Document Released: 06/05/2005 Document Revised: 12/05/2011 Document Reviewed: 10/07/2013 °[employer]® Patient Information ©2015 [employer]. This information is not intended to replace advice given to you by your health care provider. Make sure you  discuss any questions you have with your health care provider. °Chest Pain (Nonspecific) °It is often hard to give a specific diagnosis for the cause of chest pain. There is always a chance that your pain could be related to something serious, such as a heart attack or a blood clot in the lungs. You need to follow up with your health care provider for further evaluation. °CAUSES  °· Heartburn. °· Pneumonia or bronchitis. °· Anxiety or stress. °· Inflammation around your heart (pericarditis) or lung (pleuritis or pleurisy). °· A blood clot in the lung. °· A collapsed lung (pneumothorax). It can develop suddenly on its own (spontaneous pneumothorax) or from trauma to the chest. °· Shingles infection (herpes zoster virus). °The chest wall is composed of bones, muscles, and cartilage. Any of these can be the source of the pain. °· The bones can be bruised by injury. °· The muscles or cartilage can be strained by coughing or overwork. °· The cartilage can be affected by inflammation and become sore (costochondritis). °DIAGNOSIS  °Lab tests or other studies may be needed to find the cause of your pain. Your health care provider may have you take a test called an ambulatory electrocardiogram (ECG). An ECG records your heartbeat patterns over a 24-hour period. You may also have other tests, such as: °· Transthoracic echocardiogram (TTE). During echocardiography, sound waves are used to evaluate how blood flows through your heart. °· Transesophageal echocardiogram (TEE). °· Cardiac monitoring. This allows your health care provider to monitor your heart rate and rhythm in real time. °· Holter monitor. This is a portable device that records your heartbeat and can help diagnose heart arrhythmias. It allows your health care provider to track your heart activity for several days, if needed. °· Stress tests by exercise or by giving medicine that makes the heart beat faster. °TREATMENT  °· Treatment depends on what may be causing  your chest pain. Treatment may include: °· Acid blockers for heartburn. °· Anti-inflammatory medicine. °· Pain medicine for inflammatory conditions. °· Antibiotics if an infection is present. °· You may be advised to change lifestyle habits. This includes stopping smoking and avoiding alcohol, caffeine, and chocolate. °· You may be advised to keep your head raised (elevated) when sleeping. This reduces the chance of acid going backward from your stomach into your esophagus. °Most of the time, nonspecific chest pain will improve within 2-3 days with rest and mild pain medicine.  °HOME CARE INSTRUCTIONS  °· If antibiotics were prescribed, take them as directed. Finish them even if you start to feel better. °· For the next few days, avoid physical activities that bring on chest pain. Continue physical activities as directed. °· Do not use any tobacco products, including cigarettes, chewing tobacco, or electronic cigarettes. °· Avoid drinking alcohol. °· Only take medicine as directed by your health care provider. °· Follow your health care provider's suggestions for further testing if your chest pain does not go away. °· Keep any follow-up appointments you   made. If you do not go to an appointment, you could develop lasting (chronic) problems with pain. If there is any problem keeping an appointment, call to reschedule. °SEEK MEDICAL CARE IF:  °· Your chest pain does not go away, even after treatment. °· You have a rash with blisters on your chest. °· You have a fever. °SEEK IMMEDIATE MEDICAL CARE IF:  °· You have increased chest pain or pain that spreads to your arm, neck, jaw, back, or abdomen. °· You have shortness of breath. °· You have an increasing cough, or you cough up blood. °· You have severe back or abdominal pain. °· You feel nauseous or vomit. °· You have severe weakness. °· You faint. °· You have chills. °This is an emergency. Do not wait to see if the pain will go away. Get medical help at once. Call your  local emergency services (911 in [place] Do not drive yourself to the hospital. °MAKE SURE YOU:  °· Understand these instructions. °· Will watch your condition. °· Will get help right away if you are not doing well or get worse. °Document Released: 03/15/2005 Document Revised: 06/10/2013 Document Reviewed: 01/09/2008 °[employer]® Patient Information ©2015 [employer]. This information is not intended to replace advice given to you by your health care provider. Make sure you discuss any questions you have with your health care provider. ° °

## 2014-11-25 ENCOUNTER — Other Ambulatory Visit: Payer: Self-pay | Admitting: Internal Medicine

## 2014-12-09 ENCOUNTER — Other Ambulatory Visit: Payer: Self-pay | Admitting: Internal Medicine

## 2015-01-17 ENCOUNTER — Encounter (HOSPITAL_COMMUNITY): Payer: Self-pay | Admitting: Emergency Medicine

## 2015-01-17 ENCOUNTER — Emergency Department (HOSPITAL_COMMUNITY): Payer: Medicaid Other

## 2015-01-17 ENCOUNTER — Emergency Department (HOSPITAL_COMMUNITY)
Admission: EM | Admit: 2015-01-17 | Discharge: 2015-01-17 | Disposition: A | Discharge status: Home / Self Care | Payer: Medicaid Other | Attending: Emergency Medicine | Admitting: Emergency Medicine

## 2015-01-17 DIAGNOSIS — J45909 Unspecified asthma, uncomplicated: Secondary | ICD-10-CM | POA: Diagnosis not present

## 2015-01-17 DIAGNOSIS — R0789 Other chest pain: Secondary | ICD-10-CM | POA: Insufficient documentation

## 2015-01-17 DIAGNOSIS — Z794 Long term (current) use of insulin: Secondary | ICD-10-CM | POA: Insufficient documentation

## 2015-01-17 DIAGNOSIS — G8929 Other chronic pain: Secondary | ICD-10-CM | POA: Insufficient documentation

## 2015-01-17 DIAGNOSIS — Z87891 Personal history of nicotine dependence: Secondary | ICD-10-CM | POA: Diagnosis not present

## 2015-01-17 DIAGNOSIS — Z79899 Other long term (current) drug therapy: Secondary | ICD-10-CM | POA: Diagnosis not present

## 2015-01-17 DIAGNOSIS — Z872 Personal history of diseases of the skin and subcutaneous tissue: Secondary | ICD-10-CM | POA: Insufficient documentation

## 2015-01-17 DIAGNOSIS — R51 Headache: Secondary | ICD-10-CM | POA: Insufficient documentation

## 2015-01-17 DIAGNOSIS — R519 Headache, unspecified: Secondary | ICD-10-CM

## 2015-01-17 DIAGNOSIS — Z7982 Long term (current) use of aspirin: Secondary | ICD-10-CM | POA: Diagnosis not present

## 2015-01-17 DIAGNOSIS — E119 Type 2 diabetes mellitus without complications: Secondary | ICD-10-CM | POA: Insufficient documentation

## 2015-01-17 DIAGNOSIS — I10 Essential (primary) hypertension: Secondary | ICD-10-CM | POA: Insufficient documentation

## 2015-01-17 LAB — I-STAT CHEM 8, ED
BUN: 16 mg/dL (ref 6–20)
CHLORIDE: 103 mmol/L (ref 101–111)
Calcium, Ion: 1.22 mmol/L (ref 1.12–1.23)
Creatinine, Ser: 0.9 mg/dL (ref 0.61–1.24)
Glucose, Bld: 137 mg/dL — ABNORMAL HIGH (ref 65–99)
HCT: 49 % (ref 39.0–52.0)
HEMOGLOBIN: 16.7 g/dL (ref 13.0–17.0)
POTASSIUM: 3.4 mmol/L — AB (ref 3.5–5.1)
SODIUM: 141 mmol/L (ref 135–145)
TCO2: 25 mmol/L (ref 0–100)

## 2015-01-17 LAB — I-STAT TROPONIN, ED: Troponin i, poc: 0 ng/mL (ref 0.00–0.08)

## 2015-01-17 LAB — D-DIMER, QUANTITATIVE: D-Dimer, Quant: <0.27 ug/mL-FEU (ref 0.00–0.48)

## 2015-01-17 MED ORDER — ACETAMINOPHEN 325 MG PO TABS
650.0000 mg | ORAL_TABLET | Freq: Once | ORAL | Status: AC
  Administered 2015-01-17: 650 mg via ORAL
  Filled 2015-01-17: qty 2

## 2015-01-17 MED ORDER — KETOROLAC TROMETHAMINE 30 MG/ML IJ SOLN: 30.0000 mg | Freq: Once | INTRAMUSCULAR | Status: DC

## 2015-01-17 MED ORDER — METOCLOPRAMIDE HCL 5 MG/ML IJ SOLN: 10.0000 mg | Freq: Once | INTRAMUSCULAR | Status: DC

## 2015-01-17 MED ORDER — DIPHENHYDRAMINE HCL 50 MG/ML IJ SOLN
25.0000 mg | Freq: Once | INTRAMUSCULAR | Status: AC
  Administered 2015-01-17: 25 mg via INTRAMUSCULAR
  Filled 2015-01-17: qty 1

## 2015-01-17 MED ORDER — IBUPROFEN 800 MG PO TABS
800.0000 mg | ORAL_TABLET | Freq: Once | ORAL | Status: AC
Start: 2015-01-17 — End: 2015-01-17
  Administered 2015-01-17: 800 mg via ORAL
  Filled 2015-01-17: qty 1

## 2015-01-17 MED ORDER — KETOROLAC TROMETHAMINE 60 MG/2ML IM SOLN
60.0000 mg | Freq: Once | INTRAMUSCULAR | Status: AC
  Administered 2015-01-17: 60 mg via INTRAMUSCULAR
  Filled 2015-01-17: qty 2

## 2015-01-17 MED ORDER — METOCLOPRAMIDE HCL 5 MG/ML IJ SOLN
10.0000 mg | Freq: Once | INTRAMUSCULAR | Status: AC
  Administered 2015-01-17: 10 mg via INTRAMUSCULAR
  Filled 2015-01-17: qty 2

## 2015-01-17 MED ORDER — TETRACAINE HCL 0.5 % OP SOLN
2.0000 [drp] | Freq: Once | OPHTHALMIC | Status: AC
  Administered 2015-01-17: 2 [drp] via OPHTHALMIC
  Filled 2015-01-17: qty 2

## 2015-01-17 MED ORDER — HYDROCODONE-ACETAMINOPHEN 5-325 MG PO TABS
2.0000 | ORAL_TABLET | Freq: Once | ORAL | Status: AC
  Administered 2015-01-17: 2 via ORAL
  Filled 2015-01-17: qty 2

## 2015-01-17 MED ORDER — DIPHENHYDRAMINE HCL 50 MG/ML IJ SOLN: 25.0000 mg | Freq: Once | INTRAMUSCULAR | Status: DC

## 2015-01-17 NOTE — ED Notes (Signed)
Pt reports left sided temporal headache x 2 days. States he has taken several OTC medications with no reliet. Pt denies hx of headaches. Pt reports his ears have been popping frequently, and has been having intermittent dizziness.

## 2015-01-17 NOTE — ED Notes (Signed)
Pt ambulated around ER.  O2 sat varied from 97% to 88% on room air.  Pt denies any sob and continues to insist that this is normal for him.  Pt continues to c/o pain in head and is requesting more pain medication but does not want injections.  Pt placed on 2L [place] to see if headache improves with consistent O2 sat.

## 2015-01-17 NOTE — ED Provider Notes (Signed)
CSN: [id number]     Arrival date & time 01/17/15  1607 History   First MD Initiated Contact with Patient 01/17/15 1618     Chief Complaint  Patient presents with  . Headache     (Consider location/radiation/quality/duration/timing/severity/associated sxs/prior Treatment) HPI Comments: Morbidly obese male presenting with left-sided headache that onset yesterday. Pain is all left-sided and has had the onset with rest and gradually progressed. Denies thunderclap onset. Denies any nausea, vomiting, fever. She denies any focal weakness, numbness or tingling. He took a BC powder without relief. Denies any chest pain or shortness of breath. Denies any history of headache but record review shows patient has had several ED visits for headache in the past. He endorses popping sensation in his ears and intermittent lightheadedness. Hypoxic to 89% in triage though improved to 95% in room. Denies any shortness of breath. States his vision is at baseline. Was told recently that he might have glaucoma.  The history is provided by the patient.    Past Medical History  Diagnosis Date  . Hypertension   . Diabetes mellitus   . Abscess   . Chronic back pain   . Sciatica   . Asthma   . Bronchitis   . OSA (obstructive sleep apnea)   . Sleep apnea     uses cpap  . Headache   . Chronic pain    Past Surgical History  Procedure Laterality Date  . No past surgeries     Family History  Problem Relation Age of Onset  . Hypertension Mother   . Diabetes Mother   . Hypertension Father   . Diabetes Father   . Diabetes Brother   . Hypertension Brother   . Colon cancer Sister    History  Substance Use Topics  . Smoking status: Former Smoker -- 0.50 packs/day for 24 years    Types: Cigarettes    Start date: 06/20/1987    Quit date: 12/18/2011  . Smokeless tobacco: Never Used  . Alcohol Use: No    Review of Systems  Constitutional: Negative for fever, activity change and appetite change.  HENT:  Negative for congestion.   Eyes: Negative for photophobia and visual disturbance.  Respiratory: Negative for cough, chest tightness and shortness of breath.   Cardiovascular: Positive for chest pain.  Gastrointestinal: Negative for nausea, vomiting and abdominal pain.  Genitourinary: Negative for dysuria, urgency and hematuria.  Musculoskeletal: Negative for myalgias, back pain and arthralgias.  Skin: Negative for rash.  Neurological: Positive for headaches. Negative for dizziness, weakness, light-headedness and numbness.  A complete 10 system review of systems was obtained and all systems are negative except as noted in the HPI and PMH.      Allergies  Sulfa antibiotics  Home Medications   Prior to Admission medications   Medication Sig Start Date End Date Taking? Authorizing Provider  ALPRAZolam Duanne Moron) 1 MG tablet Take 1 mg by mouth 4 (four) times daily. 01/08/15  Yes Historical Provider, MD  amLODipine (NORVASC) 10 MG tablet Take 10 mg by mouth daily. 08/06/14  Yes Historical Provider, MD  aspirin EC 81 MG tablet Take 81 mg by mouth daily.   Yes Historical Provider, MD  atorvastatin (LIPITOR) 20 MG tablet Take 20 mg by mouth at bedtime. 09/01/14  Yes Historical Provider, MD  budesonide-formoterol (SYMBICORT) 160-4.5 MCG/ACT inhaler Inhale 2 puffs into the lungs daily as needed (for shortness of breath and wheezing).   Yes Historical Provider, MD  buPROPion (WELLBUTRIN XL) 300 MG 24 hr  tablet Take 300 mg by mouth every morning. 01/08/15  Yes Historical Provider, MD  carvedilol (COREG) 12.5 MG tablet Take 12.5 mg by mouth 2 (two) times daily with a meal.   Yes Historical Provider, MD  famotidine (PEPCID) 20 MG tablet take 1 tablet by mouth at bedtime 12/09/14  Yes Tanda Rockers, MD  furosemide (LASIX) 20 MG tablet Take 1 tablet (20 mg total) by mouth daily. 11/02/13  Yes Belkys A Regalado, MD  ibuprofen (ADVIL,MOTRIN) 200 MG tablet Take 400 mg by mouth every 6 (six) hours as needed for  headache.   Yes Historical Provider, MD  insulin aspart (NOVOLOG) 100 UNIT/ML injection Inject 10 Units into the skin 3 (three) times daily with meals. 11/02/13  Yes Belkys A Regalado, MD  LANTUS SOLOSTAR 100 UNIT/ML Solostar Pen Inject 30 Units into the skin at bedtime. 08/28/14  Yes Historical Provider, MD  latanoprost (XALATAN) 0.005 % ophthalmic solution Place 1 drop into both eyes at bedtime. 08/21/14  Yes Historical Provider, MD  metFORMIN (GLUCOPHAGE) 1000 MG tablet Take 1,000 mg by mouth 2 (two) times daily with a meal.     Yes Historical Provider, MD  omeprazole (PRILOSEC) 40 MG capsule Take 40 mg by mouth daily. 01/14/15  Yes Historical Provider, MD  pantoprazole (PROTONIX) 40 MG tablet take 1 tablet by mouth 30 TO 60 MINUTES BEFORE FIRST MEAL OF THE DAY 11/25/14  Yes Tanda Rockers, MD  VYVANSE 50 MG capsule Take 50 mg by mouth every morning. 01/08/15  Yes Historical Provider, MD  doxycycline (VIBRAMYCIN) 100 MG capsule Take 1 capsule (100 mg total) by mouth 2 (two) times daily. Patient not taking: Reported on 10/13/2014 09/17/14   Evalee Jefferson, PA-C  HYDROcodone-acetaminophen (NORCO/VICODIN) 5-325 MG per tablet Take 1 tablet by mouth every 4 (four) hours as needed. Patient not taking: Reported on 10/13/2014 09/17/14   Evalee Jefferson, PA-C  insulin glargine (LANTUS) 100 UNIT/ML injection Inject 0.15 mLs (15 Units total) into the skin at bedtime. Patient not taking: Reported on 09/17/2014 11/02/13   Belkys A Regalado, MD  metroNIDAZOLE (FLAGYL) 500 MG tablet Take 1 tablet (500 mg total) by mouth 2 (two) times daily. Patient not taking: Reported on 08/11/2014 11/09/13   Lacretia Leigh, MD  oxyCODONE (ROXICODONE) 5 MG immediate release tablet Take 1 tablet (5 mg total) by mouth every 6 (six) hours as needed for severe pain. 10/13/14   Kristen N Ward, DO  testosterone cypionate (DEPOTESTOSTERONE CYPIONATE) 200 MG/ML injection Inject 1 mL into the muscle every 21 ( twenty-one) days. 12/17/14   Historical Provider, MD    BP 154/102 mmHg  Pulse 79  Temp(Src) 97.7 F (36.5 C) (Oral)  Resp 20  Ht 5\' 11"  (1.803 m)  Wt 398 lb (180.532 kg)  BMI 55.53 kg/m2  SpO2 95% Physical Exam  Constitutional: He is oriented to person, place, and time. He appears well-developed and well-nourished. No distress.  HENT:  Head: Normocephalic and atraumatic.  Mouth/Throat: Oropharynx is clear and moist. No oropharyngeal exudate.  No temporal artery tenderness  Eyes: Conjunctivae and EOM are normal. Pupils are equal, round, and reactive to light.  IOP OD 16, OS 18  Neck: Normal range of motion. Neck supple.  No meningismus.  Cardiovascular: Normal rate, regular rhythm, normal heart sounds and intact distal pulses.   No murmur heard. Pulmonary/Chest: Effort normal and breath sounds normal. No respiratory distress.  Abdominal: Soft. There is no tenderness. There is no rebound and no guarding.  Musculoskeletal: Normal range of  motion. He exhibits no edema or tenderness.  Neurological: He is alert and oriented to person, place, and time. No cranial nerve deficit. He exhibits normal muscle tone. Coordination normal.  No ataxia on finger to nose bilaterally. No pronator drift. 5/5 strength throughout. CN 2-12 intact. Negative Romberg. Equal grip strength. Sensation intact. Gait is normal.   Skin: Skin is warm.  Psychiatric: He has a normal mood and affect. His behavior is normal.  Nursing note and vitals reviewed.   ED Course  Procedures (including critical care time) Labs Review Labs Reviewed  I-STAT CHEM 8, ED - Abnormal; Notable for the following:    Potassium 3.4 (*)    Glucose, Bld 137 (*)    All other components within normal limits  D-DIMER, QUANTITATIVE (NOT AT [facility])  Randolm Idol, ED    Imaging Review Dg Chest 2 View  01/17/2015   CLINICAL DATA:  Short of breath.  Chest pain.  EXAM: CHEST  2 VIEW  COMPARISON:  10/13/2014 and multiple priors.  FINDINGS: Exam degraded by obese body habitus. Breast shadows  and abdominal panniculus project over the chest.  Cardiopericardial silhouette within normal limits. Mediastinal contours normal. Trachea midline. No airspace disease or effusion. Monitoring leads project over the chest.  IMPRESSION: No active cardiopulmonary disease.   Electronically Signed   By: Dereck Ligas M.D.   On: 01/17/2015 18:17     EKG Interpretation   Date/Time:  Sunday January 17 2015 17:59:52 EDT Ventricular Rate:  82 PR Interval:  151 QRS Duration: 108 QT Interval:  383 QTC Calculation: 447 R Axis:   -44 Text Interpretation:  Sinus rhythm Left axis deviation Low voltage,  precordial leads Abnormal R-wave progression, late transition No  significant change was found Confirmed by Wyvonnia Dusky  MD, Alaisa Moffitt [phone]) on  01/17/2015 6:05:38 PM      MDM   Final diagnoses:  Headache, unspecified headache type  Atypical chest pain   2 day history of gradual onset headache associated with intermittent dizziness. Nonfocal neurological exam. No fever. No thunderclap onset. Doubt subretinal hemorrhage. Doubt meningitis. Doubt temporal arteritis.  Record review shows patient had normal CT head in February 2016. Intraocular pressures are normal. Doubt glaucoma. Patient refusing any IV or shot. He is given Tylenol and Motrin for his headache.  Patient with some episodes of desaturation to the mid 80s. States he wears oxygen at night only. He is morbidly obese. Denies any shortness of breath. Oxygenation improved to the mid 90s on 2 L. Chest x-ray is negative.  Discussed with patient that he could be at risk for blood clot given his body habitus, shortness of breath and hypoxia. He is agreeable to obtain blood work but not IV. At this time.  Patient describes atypical episodes of chest pain lasting for just a few seconds at a time. None currently. EKG normal sinus rhythm. Chest x-rays negative. D-dimer is negative.  Cardiopulmonary workup negative. Low suspicion for ACS and PE. Headache  has improved with treatment in the ED. Patient was agreeable to receive IM Toradol, Reglan and Benadryl.  Low suspicion for emergent causes of headache. He is stable for outpatient follow-up. Return precautions discussed.  Ezequiel Essex, MD 01/17/15 2001

## 2015-01-17 NOTE — ED Notes (Signed)
Pt states that he does not want any lab work done nor any injections of any kind.  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  Advised him to make the MD aware of this.

## 2015-01-17 NOTE — ED Notes (Signed)
Walked in patient's room and removed tourniquet from right upper arm. Good cap refill noted  And not complaints voiced

## 2015-01-17 NOTE — ED Notes (Signed)
Pt drank entire cup of water given to take po meds.

## 2015-01-17 NOTE — Discharge Instructions (Signed)

## 2015-04-15 ENCOUNTER — Emergency Department (HOSPITAL_COMMUNITY)
Admission: EM | Admit: 2015-04-15 | Discharge: 2015-04-15 | Disposition: A | Discharge status: Home / Self Care | Payer: Medicaid Other | Attending: Emergency Medicine | Admitting: Emergency Medicine

## 2015-04-15 ENCOUNTER — Encounter (HOSPITAL_COMMUNITY): Payer: Self-pay

## 2015-04-15 DIAGNOSIS — Z87891 Personal history of nicotine dependence: Secondary | ICD-10-CM | POA: Diagnosis not present

## 2015-04-15 DIAGNOSIS — J45909 Unspecified asthma, uncomplicated: Secondary | ICD-10-CM | POA: Diagnosis not present

## 2015-04-15 DIAGNOSIS — M545 Low back pain: Secondary | ICD-10-CM | POA: Diagnosis not present

## 2015-04-15 DIAGNOSIS — I10 Essential (primary) hypertension: Secondary | ICD-10-CM | POA: Diagnosis not present

## 2015-04-15 DIAGNOSIS — Z79899 Other long term (current) drug therapy: Secondary | ICD-10-CM | POA: Diagnosis not present

## 2015-04-15 DIAGNOSIS — G4733 Obstructive sleep apnea (adult) (pediatric): Secondary | ICD-10-CM | POA: Insufficient documentation

## 2015-04-15 DIAGNOSIS — Z872 Personal history of diseases of the skin and subcutaneous tissue: Secondary | ICD-10-CM | POA: Insufficient documentation

## 2015-04-15 DIAGNOSIS — G8929 Other chronic pain: Secondary | ICD-10-CM | POA: Diagnosis not present

## 2015-04-15 DIAGNOSIS — Z9981 Dependence on supplemental oxygen: Secondary | ICD-10-CM | POA: Insufficient documentation

## 2015-04-15 DIAGNOSIS — E119 Type 2 diabetes mellitus without complications: Secondary | ICD-10-CM | POA: Diagnosis not present

## 2015-04-15 DIAGNOSIS — Z794 Long term (current) use of insulin: Secondary | ICD-10-CM | POA: Diagnosis not present

## 2015-04-15 DIAGNOSIS — Z7982 Long term (current) use of aspirin: Secondary | ICD-10-CM | POA: Insufficient documentation

## 2015-04-15 MED ORDER — OXYCODONE-ACETAMINOPHEN 5-325 MG PO TABS
1.0000 | ORAL_TABLET | Freq: Once | ORAL | Status: AC
  Administered 2015-04-15: 1 via ORAL
  Filled 2015-04-15: qty 1

## 2015-04-15 MED ORDER — IBUPROFEN 400 MG PO TABS
600.0000 mg | ORAL_TABLET | Freq: Once | ORAL | Status: AC
  Administered 2015-04-15: 600 mg via ORAL
  Filled 2015-04-15: qty 2

## 2015-04-15 MED ORDER — IBUPROFEN 600 MG PO TABS: 600.0000 mg | ORAL_TABLET | Freq: Three times a day (TID) | ORAL | Status: DC | PRN

## 2015-04-15 MED ORDER — CYCLOBENZAPRINE HCL 10 MG PO TABS
10.0000 mg | ORAL_TABLET | Freq: Once | ORAL | Status: AC
  Administered 2015-04-15: 10 mg via ORAL
  Filled 2015-04-15: qty 1

## 2015-04-15 MED ORDER — CYCLOBENZAPRINE HCL 10 MG PO TABS: 10.0000 mg | ORAL_TABLET | Freq: Three times a day (TID) | ORAL | Status: DC | PRN

## 2015-04-15 NOTE — ED Provider Notes (Signed)
CSN: [id number]     Arrival date & time 04/15/15  0111 History   First MD Initiated Contact with Patient 04/15/15 0136     Chief Complaint  Patient presents with  . Back Pain    HPI  patient presents to emergency department complaining of low back pain worsening over the past week.  He states he has long-standing history of low back pain but reports this seems to be worsening over the past week.  No history of IV drug abuse.  No prior surgery of his low back.  No history of cancer.  He is morbidly obese and weighs 398 pounds.  He states he is working on his weight and attempting to lose some.  He's tried over-the-counter medications without improvement in his symptoms.  No other complaints this time.  Denies abdominal pain.  Fevers or chills.  No weakness of his lower extremities.  No radiating pain     Past Medical History  Diagnosis Date  . Hypertension   . Diabetes mellitus   . Abscess   . Chronic back pain   . Sciatica   . Asthma   . Bronchitis   . OSA (obstructive sleep apnea)   . Sleep apnea     uses cpap  . Headache   . Chronic pain    Past Surgical History  Procedure Laterality Date  . No past surgeries     Family History  Problem Relation Age of Onset  . Hypertension Mother   . Diabetes Mother   . Hypertension Father   . Diabetes Father   . Diabetes Brother   . Hypertension Brother   . Colon cancer Sister    Social History  Substance Use Topics  . Smoking status: Former Smoker -- 0.50 packs/day for 24 years    Types: Cigarettes    Start date: 06/20/1987    Quit date: 12/18/2011  . Smokeless tobacco: Never Used  . Alcohol Use: No    Review of Systems  All other systems reviewed and are negative.  Review of Systems  All other systems reviewed and are negative.     Allergies  Sulfa antibiotics  Home Medications   Prior to Admission medications   Medication Sig Start Date End Date Taking? Authorizing Provider  ALPRAZolam Duanne Moron) 1 MG tablet Take  1 mg by mouth 4 (four) times daily. 01/08/15  Yes Historical Provider, MD  amLODipine (NORVASC) 10 MG tablet Take 10 mg by mouth daily. 08/06/14  Yes Historical Provider, MD  aspirin EC 81 MG tablet Take 81 mg by mouth daily.   Yes Historical Provider, MD  atorvastatin (LIPITOR) 20 MG tablet Take 20 mg by mouth at bedtime. 09/01/14  Yes Historical Provider, MD  budesonide-formoterol (SYMBICORT) 160-4.5 MCG/ACT inhaler Inhale 2 puffs into the lungs daily as needed (for shortness of breath and wheezing).   Yes Historical Provider, MD  buPROPion (WELLBUTRIN XL) 300 MG 24 hr tablet Take 300 mg by mouth every morning. 01/08/15  Yes Historical Provider, MD  carvedilol (COREG) 12.5 MG tablet Take 12.5 mg by mouth 2 (two) times daily with a meal.   Yes Historical Provider, MD  insulin aspart (NOVOLOG) 100 UNIT/ML injection Inject 10 Units into the skin 3 (three) times daily with meals. 11/02/13  Yes Belkys A Regalado, MD  insulin glargine (LANTUS) 100 UNIT/ML injection Inject 0.15 mLs (15 Units total) into the skin at bedtime. 11/02/13  Yes Belkys A Regalado, MD  LANTUS SOLOSTAR 100 UNIT/ML Solostar Pen Inject 30 Units  into the skin at bedtime. 08/28/14  Yes Historical Provider, MD  latanoprost (XALATAN) 0.005 % ophthalmic solution Place 1 drop into both eyes at bedtime. 08/21/14  Yes Historical Provider, MD  metFORMIN (GLUCOPHAGE) 1000 MG tablet Take 1,000 mg by mouth 2 (two) times daily with a meal.     Yes Historical Provider, MD  metroNIDAZOLE (FLAGYL) 500 MG tablet Take 1 tablet (500 mg total) by mouth 2 (two) times daily. 11/09/13  Yes Lacretia Leigh, MD  omeprazole (PRILOSEC) 40 MG capsule Take 40 mg by mouth daily. 01/14/15  Yes Historical Provider, MD  VYVANSE 50 MG capsule Take 50 mg by mouth every morning. 01/08/15  Yes Historical Provider, MD  cyclobenzaprine (FLEXERIL) 10 MG tablet Take 1 tablet (10 mg total) by mouth 3 (three) times daily as needed for muscle spasms. 04/15/15   Jola Schmidt, MD  famotidine  (PEPCID) 20 MG tablet take 1 tablet by mouth at bedtime 12/09/14   Tanda Rockers, MD  furosemide (LASIX) 20 MG tablet Take 1 tablet (20 mg total) by mouth daily. 11/02/13   Belkys A Regalado, MD  ibuprofen (ADVIL,MOTRIN) 600 MG tablet Take 1 tablet (600 mg total) by mouth every 8 (eight) hours as needed. 04/15/15   Jola Schmidt, MD  pantoprazole (PROTONIX) 40 MG tablet take 1 tablet by mouth 30 TO 60 MINUTES BEFORE FIRST MEAL OF THE DAY 11/25/14   Tanda Rockers, MD  testosterone cypionate (DEPOTESTOSTERONE CYPIONATE) 200 MG/ML injection Inject 1 mL into the muscle every 21 ( twenty-one) days. 12/17/14   Historical Provider, MD   BP 110/75 mmHg  Pulse 83  Temp(Src) 98.6 F (37 C) (Oral)  Resp 22  Ht 5\' 11"  (1.803 m)  Wt 398 lb (180.532 kg)  BMI 55.53 kg/m2  SpO2 91% Physical Exam  Constitutional: He is oriented to person, place, and time. He appears well-developed and well-nourished.  HENT:  Head: Normocephalic and atraumatic.  Eyes: EOM are normal.  Neck: Normal range of motion.  Cardiovascular: Normal rate.   Pulmonary/Chest: Effort normal.  Abdominal: Soft. There is no tenderness.  Musculoskeletal: Normal range of motion.  5 out of 5 strength of bilateral lower extremity major muscle groups  Neurological: He is alert and oriented to person, place, and time.  Skin: Skin is warm and dry.  Psychiatric: He has a normal mood and affect. Judgment normal.  Nursing note and vitals reviewed.   ED Course  Procedures (including critical care time) Labs Review Labs Reviewed - No data to display  Imaging Review No results found. I have personally reviewed and evaluated these images and lab results as part of my medical decision-making.   EKG Interpretation None      MDM   Final diagnoses:  Low back pain without sciatica, unspecified back pain laterality    Acute on chronic low back pain.  This appears be musculoskeletal.  Normal strength in his lower extremities.  Normal  ambulation.  Discharge home in good condition.  No indication for imaging.  No back pain red flags.  Rickman to weight loss, anti-inflammatories, muscle relaxants    Jola Schmidt, MD 04/15/15 [phone]

## 2015-04-15 NOTE — ED Notes (Signed)
Pt c/o lower back and middle back pain for some time, states recently hurting worse.

## 2015-04-15 NOTE — Discharge Instructions (Signed)

## 2015-05-22 ENCOUNTER — Encounter (HOSPITAL_COMMUNITY): Payer: Self-pay

## 2015-05-22 ENCOUNTER — Emergency Department (HOSPITAL_COMMUNITY): Payer: Medicaid Other

## 2015-05-22 ENCOUNTER — Emergency Department (HOSPITAL_COMMUNITY)
Admission: EM | Admit: 2015-05-22 | Discharge: 2015-05-22 | Discharge status: Against Medical Advice | Payer: Medicaid Other | Attending: Emergency Medicine | Admitting: Emergency Medicine

## 2015-05-22 DIAGNOSIS — Z7984 Long term (current) use of oral hypoglycemic drugs: Secondary | ICD-10-CM | POA: Insufficient documentation

## 2015-05-22 DIAGNOSIS — Z794 Long term (current) use of insulin: Secondary | ICD-10-CM | POA: Diagnosis not present

## 2015-05-22 DIAGNOSIS — J45909 Unspecified asthma, uncomplicated: Secondary | ICD-10-CM | POA: Insufficient documentation

## 2015-05-22 DIAGNOSIS — Z872 Personal history of diseases of the skin and subcutaneous tissue: Secondary | ICD-10-CM | POA: Diagnosis not present

## 2015-05-22 DIAGNOSIS — M542 Cervicalgia: Secondary | ICD-10-CM | POA: Insufficient documentation

## 2015-05-22 DIAGNOSIS — I10 Essential (primary) hypertension: Secondary | ICD-10-CM | POA: Insufficient documentation

## 2015-05-22 DIAGNOSIS — G4730 Sleep apnea, unspecified: Secondary | ICD-10-CM | POA: Insufficient documentation

## 2015-05-22 DIAGNOSIS — G8929 Other chronic pain: Secondary | ICD-10-CM | POA: Diagnosis not present

## 2015-05-22 DIAGNOSIS — Z79899 Other long term (current) drug therapy: Secondary | ICD-10-CM | POA: Insufficient documentation

## 2015-05-22 DIAGNOSIS — Z792 Long term (current) use of antibiotics: Secondary | ICD-10-CM | POA: Insufficient documentation

## 2015-05-22 DIAGNOSIS — E119 Type 2 diabetes mellitus without complications: Secondary | ICD-10-CM | POA: Diagnosis not present

## 2015-05-22 DIAGNOSIS — Z87891 Personal history of nicotine dependence: Secondary | ICD-10-CM | POA: Insufficient documentation

## 2015-05-22 DIAGNOSIS — M545 Low back pain: Secondary | ICD-10-CM | POA: Insufficient documentation

## 2015-05-22 DIAGNOSIS — Z7982 Long term (current) use of aspirin: Secondary | ICD-10-CM | POA: Insufficient documentation

## 2015-05-22 MED ORDER — CYCLOBENZAPRINE HCL 10 MG PO TABS: 10.0000 mg | ORAL_TABLET | Freq: Two times a day (BID) | ORAL | Status: DC | PRN

## 2015-05-22 MED ORDER — DIAZEPAM 5 MG PO TABS
5.0000 mg | ORAL_TABLET | Freq: Once | ORAL | Status: AC
Start: 2015-05-22 — End: 2015-05-22
  Administered 2015-05-22: 5 mg via ORAL
  Filled 2015-05-22: qty 1

## 2015-05-22 MED ORDER — HYDROCODONE-ACETAMINOPHEN 5-325 MG PO TABS
1.0000 | ORAL_TABLET | Freq: Once | ORAL | Status: AC
  Administered 2015-05-22: 1 via ORAL
  Filled 2015-05-22: qty 1

## 2015-05-22 MED ORDER — IBUPROFEN 800 MG PO TABS: 800.0000 mg | ORAL_TABLET | Freq: Three times a day (TID) | ORAL | Status: DC

## 2015-05-22 MED ORDER — IBUPROFEN 800 MG PO TABS
800.0000 mg | ORAL_TABLET | Freq: Once | ORAL | Status: AC
  Administered 2015-05-22: 800 mg via ORAL
  Filled 2015-05-22: qty 1

## 2015-05-22 NOTE — ED Notes (Signed)
Pt states his neck started getting stiff this evening when he was driving home from work, states it hurts to turn his head.  Pt states he is also having back pain which is chronic

## 2015-05-22 NOTE — Discharge Instructions (Signed)
Acute Torticollis As we discussed, additional imaging of the blood vessels of your neck could be obtained but you declined this.  Return to the ED if you develop new or worsening symptoms. Torticollis is a condition in which the muscles of the neck tighten (contract) abnormally, causing the neck to twist and the head to move into an unnatural position. Torticollis that develops suddenly is called acute torticollis. If torticollis becomes chronic and is left untreated, the face and neck can become deformed. CAUSES This condition may be caused by:  Sleeping in an awkward position (common).  Extending or twisting the neck muscles beyond their normal position.  Infection. In some cases, the cause may not be known. SYMPTOMS Symptoms of this condition include:  An unnatural position of the head.  Neck pain.  A limited ability to move the neck.  Twisting of the neck to one side. DIAGNOSIS This condition is diagnosed with a physical exam. You may also have imaging tests, such as an X-ray, CT scan, or MRI. TREATMENT Treatment for this condition involves trying to relax the neck muscles. It may include:  Medicines or shots.  Physical therapy.  Surgery. This may be done in severe cases. HOME CARE INSTRUCTIONS  Take medicines only as directed by your health care provider.  Do stretching exercises and massage your neck as directed by your health care provider.  Keep all follow-up visits as directed by your health care provider. This is important. SEEK MEDICAL CARE IF:  You develop a fever. SEEK IMMEDIATE MEDICAL CARE IF:  You develop difficulty breathing.  You develop noisy breathing (stridor).  You start drooling.  You have trouble swallowing or have pain with swallowing.  You develop numbness or weakness in your hands or feet.  You have changes in your speech, understanding, or vision.  Your pain gets worse.   This information is not intended to replace advice given to  you by your health care provider. Make sure you discuss any questions you have with your health care provider.   Document Released: 06/02/2000 Document Revised: 10/20/2014 Document Reviewed: 06/01/2014 Elsevier Interactive Patient Education [employer].

## 2015-05-22 NOTE — ED Provider Notes (Signed)
CSN: [id number]     Arrival date & time 05/22/15  0045 History  By signing my name below, I, Arianna Nassar, attest that this documentation has been prepared under the direction and in the presence of Ezequiel Essex, MD. Electronically Signed: Julien Nordmann, ED Scribe. 05/22/2015. 1:06 AM.    Chief Complaint  Patient presents with  . Neck Pain  . Back Pain      The history is provided by the patient. No language interpreter was used.   HPI Comments: Shawn Newman is a 44 y.o. male who has a hx of chronic back pain, sciatica, OSA, HTN, and DM presents to the Emergency Department complaining of sudden, moderate, constant, "stiff", gradual worsening left posterior neck pain onset about 9 hours ago. Pt reports he has not had this kind of neck pain before. Pt has a hx of chronic back pain but reports a new back pain in the middle part of his back. He took some ibuprofen to alleviate the pain with no relief. Pt denies weakness/numbness in arms, fever, cough, shortness of breath, bladder/bowel incontinence, headache IV drug use, tingling, twist or injury to neck, visual changes and chest pain.  Past Medical History  Diagnosis Date  . Hypertension   . Diabetes mellitus   . Abscess   . Chronic back pain   . Sciatica   . Asthma   . Bronchitis   . OSA (obstructive sleep apnea)   . Sleep apnea     uses cpap  . Headache   . Chronic pain    Past Surgical History  Procedure Laterality Date  . No past surgeries     Family History  Problem Relation Age of Onset  . Hypertension Mother   . Diabetes Mother   . Hypertension Father   . Diabetes Father   . Diabetes Brother   . Hypertension Brother   . Colon cancer Sister    Social History  Substance Use Topics  . Smoking status: Former Smoker -- 0.50 packs/day for 24 years    Types: Cigarettes    Start date: 06/20/1987    Quit date: 12/18/2011  . Smokeless tobacco: Never Used  . Alcohol Use: No    Review of Systems  A complete  10 system review of systems was obtained and all systems are negative except as noted in the HPI and PMH.    Allergies  Sulfa antibiotics  Home Medications   Prior to Admission medications   Medication Sig Start Date End Date Taking? Authorizing Provider  ALPRAZolam Duanne Moron) 1 MG tablet Take 1 mg by mouth 4 (four) times daily. 01/08/15  Yes Historical Provider, MD  amLODipine (NORVASC) 10 MG tablet Take 10 mg by mouth daily. 08/06/14  Yes Historical Provider, MD  aspirin EC 81 MG tablet Take 81 mg by mouth daily.   Yes Historical Provider, MD  atorvastatin (LIPITOR) 20 MG tablet Take 20 mg by mouth at bedtime. 09/01/14  Yes Historical Provider, MD  budesonide-formoterol (SYMBICORT) 160-4.5 MCG/ACT inhaler Inhale 2 puffs into the lungs daily as needed (for shortness of breath and wheezing).   Yes Historical Provider, MD  buPROPion (WELLBUTRIN XL) 300 MG 24 hr tablet Take 300 mg by mouth every morning. 01/08/15  Yes Historical Provider, MD  carvedilol (COREG) 12.5 MG tablet Take 12.5 mg by mouth 2 (two) times daily with a meal.   Yes Historical Provider, MD  famotidine (PEPCID) 20 MG tablet take 1 tablet by mouth at bedtime 12/09/14  Yes Tanda Rockers,  MD  furosemide (LASIX) 20 MG tablet Take 1 tablet (20 mg total) by mouth daily. 11/02/13  Yes Belkys A Regalado, MD  insulin aspart (NOVOLOG) 100 UNIT/ML injection Inject 10 Units into the skin 3 (three) times daily with meals. 11/02/13  Yes Belkys A Regalado, MD  insulin glargine (LANTUS) 100 UNIT/ML injection Inject 0.15 mLs (15 Units total) into the skin at bedtime. 11/02/13  Yes Belkys A Regalado, MD  LANTUS SOLOSTAR 100 UNIT/ML Solostar Pen Inject 30 Units into the skin at bedtime. 08/28/14  Yes Historical Provider, MD  latanoprost (XALATAN) 0.005 % ophthalmic solution Place 1 drop into both eyes at bedtime. 08/21/14  Yes Historical Provider, MD  metFORMIN (GLUCOPHAGE) 1000 MG tablet Take 1,000 mg by mouth 2 (two) times daily with a meal.     Yes  Historical Provider, MD  omeprazole (PRILOSEC) 40 MG capsule Take 40 mg by mouth daily. 01/14/15  Yes Historical Provider, MD  VYVANSE 50 MG capsule Take 50 mg by mouth every morning. 01/08/15  Yes Historical Provider, MD  cyclobenzaprine (FLEXERIL) 10 MG tablet Take 1 tablet (10 mg total) by mouth 2 (two) times daily as needed for muscle spasms. 05/22/15   Ezequiel Essex, MD  ibuprofen (ADVIL,MOTRIN) 800 MG tablet Take 1 tablet (800 mg total) by mouth 3 (three) times daily. 05/22/15   Ezequiel Essex, MD  metroNIDAZOLE (FLAGYL) 500 MG tablet Take 1 tablet (500 mg total) by mouth 2 (two) times daily. 11/09/13   Lacretia Leigh, MD  pantoprazole (PROTONIX) 40 MG tablet take 1 tablet by mouth 30 TO 60 MINUTES BEFORE FIRST MEAL OF THE DAY 11/25/14   Tanda Rockers, MD  testosterone cypionate (DEPOTESTOSTERONE CYPIONATE) 200 MG/ML injection Inject 1 mL into the muscle every 21 ( twenty-one) days. 12/17/14   Historical Provider, MD   Triage vitals: BP 117/67 mmHg  Temp(Src) 97.9 F (36.6 C) (Oral)  Resp 22  Ht 5\' 11"  (1.803 m)  Wt 380 lb (172.367 kg)  BMI 53.02 kg/m2  SpO2 93% Physical Exam  Constitutional: He is oriented to person, place, and time. He appears well-developed and well-nourished. No distress.  HENT:  Head: Normocephalic and atraumatic.  Mouth/Throat: Oropharynx is clear and moist. No oropharyngeal exudate.  Eyes: Conjunctivae and EOM are normal. Pupils are equal, round, and reactive to light.  Neck: Normal range of motion. Neck supple.  No meningismus. L paraspinal neck pain No carotid bruit.  Cardiovascular: Normal rate, regular rhythm, normal heart sounds and intact distal pulses.   No murmur heard. Pulmonary/Chest: Effort normal and breath sounds normal. No respiratory distress.  Abdominal: Soft. There is no tenderness. There is no rebound and no guarding.  Musculoskeletal: Normal range of motion. He exhibits no edema or tenderness.  Left perispinal cervical tenderness No midline  tenderness Pain with turning head to left and 5/5 strength in bilateral lower extremities. Ankle plantar and dorsiflexion intact. Great toe extension intact bilaterally. +2 DP and PT pulses. +2 patellar reflexes bilaterally. Normal gait.    Neurological: He is alert and oriented to person, place, and time. No cranial nerve deficit. He exhibits normal muscle tone. Coordination normal.  No ataxia on finger to nose bilaterally. No pronator drift. 5/5 strength throughout. CN 2-12 intact.Equal grip strength. Sensation intact.   Skin: Skin is warm.  Psychiatric: He has a normal mood and affect. His behavior is normal.  Nursing note and vitals reviewed.   ED Course  Procedures  DIAGNOSTIC STUDIES: Oxygen Saturation is 93% on RA, normal by my  interpretation.  COORDINATION OF CARE:  1:04 AM Discussed treatment plan which includes neck x-ray with pt at bedside and pt agreed to plan.  Labs Review Labs Reviewed - No data to display  Imaging Review Dg Cervical Spine Complete  05/22/2015  CLINICAL DATA:  Stiffness and pain at the base of the skull, nontraumatic. Duration 11 hours. EXAM: CERVICAL SPINE - COMPLETE 4+ VIEW COMPARISON:  None. FINDINGS: There is no evidence of cervical spine fracture or prevertebral soft tissue swelling. Alignment is normal. No other significant bone abnormalities are identified. IMPRESSION: Negative cervical spine radiographs. Electronically Signed   By: Andreas Newport M.D.   On: 05/22/2015 02:23   I have personally reviewed and evaluated these images and lab results as part of my medical decision-making.   EKG Interpretation None      MDM   Final diagnoses:  Neck pain   Left paraspinal neck pain onset while he was driving home from work, difficulty turning head to the left. Denies trauma. Denies any weakness, numbness or tingling. Denies any chest pain or shortness of breath.  X-rays negative. Patient neurologically intact with normal grip strength and  pulses.  Suspect torticollis and musculoskeletal neck pain. Possibility of vertebral or carotid dissection/aneurysm considered as well.  Patient reports no improvement with medications in the ED. He does not want to have an IV or any kind of needle sticks. He declines a CT angiogram to assess the vasculature of his neck. He understands this may miss a problem with the blood vessels. This could cause worsening problems including stroke.  He wishes to have the pain treated as musculoskeletal this point and return if things get worse. He understands that more serious pathology could be missed and will leave against medical advice.   I personally performed the services described in this documentation, which was scribed in my presence. The recorded information has been reviewed and is accurate.   Ezequiel Essex, MD 05/22/15 [id number]

## 2015-06-14 ENCOUNTER — Emergency Department (HOSPITAL_COMMUNITY)
Admission: EM | Admit: 2015-06-14 | Discharge: 2015-06-14 | Disposition: A | Discharge status: Home / Self Care | Payer: Medicaid Other | Attending: Emergency Medicine | Admitting: Emergency Medicine

## 2015-06-14 ENCOUNTER — Encounter (HOSPITAL_COMMUNITY): Payer: Self-pay | Admitting: *Deleted

## 2015-06-14 ENCOUNTER — Emergency Department (HOSPITAL_COMMUNITY): Payer: Medicaid Other

## 2015-06-14 DIAGNOSIS — Y9289 Other specified places as the place of occurrence of the external cause: Secondary | ICD-10-CM | POA: Insufficient documentation

## 2015-06-14 DIAGNOSIS — Y9389 Activity, other specified: Secondary | ICD-10-CM | POA: Diagnosis not present

## 2015-06-14 DIAGNOSIS — S39012A Strain of muscle, fascia and tendon of lower back, initial encounter: Secondary | ICD-10-CM | POA: Diagnosis not present

## 2015-06-14 DIAGNOSIS — I10 Essential (primary) hypertension: Secondary | ICD-10-CM | POA: Insufficient documentation

## 2015-06-14 DIAGNOSIS — G8929 Other chronic pain: Secondary | ICD-10-CM | POA: Insufficient documentation

## 2015-06-14 DIAGNOSIS — J45909 Unspecified asthma, uncomplicated: Secondary | ICD-10-CM | POA: Insufficient documentation

## 2015-06-14 DIAGNOSIS — G4730 Sleep apnea, unspecified: Secondary | ICD-10-CM | POA: Diagnosis not present

## 2015-06-14 DIAGNOSIS — E119 Type 2 diabetes mellitus without complications: Secondary | ICD-10-CM | POA: Diagnosis not present

## 2015-06-14 DIAGNOSIS — W108XXA Fall (on) (from) other stairs and steps, initial encounter: Secondary | ICD-10-CM | POA: Diagnosis not present

## 2015-06-14 DIAGNOSIS — L02214 Cutaneous abscess of groin: Secondary | ICD-10-CM | POA: Diagnosis not present

## 2015-06-14 DIAGNOSIS — S3992XA Unspecified injury of lower back, initial encounter: Secondary | ICD-10-CM | POA: Diagnosis present

## 2015-06-14 DIAGNOSIS — Y998 Other external cause status: Secondary | ICD-10-CM | POA: Diagnosis not present

## 2015-06-14 DIAGNOSIS — Z8739 Personal history of other diseases of the musculoskeletal system and connective tissue: Secondary | ICD-10-CM | POA: Insufficient documentation

## 2015-06-14 MED ORDER — OXYCODONE-ACETAMINOPHEN 5-325 MG PO TABS: 1.0000 | ORAL_TABLET | ORAL | Status: DC | PRN

## 2015-06-14 MED ORDER — CLINDAMYCIN HCL 150 MG PO CAPS
300.0000 mg | ORAL_CAPSULE | Freq: Once | ORAL | Status: AC
  Administered 2015-06-14: 300 mg via ORAL
  Filled 2015-06-14: qty 2

## 2015-06-14 MED ORDER — OXYCODONE-ACETAMINOPHEN 5-325 MG PO TABS
1.0000 | ORAL_TABLET | Freq: Once | ORAL | Status: AC
  Administered 2015-06-14: 1 via ORAL
  Filled 2015-06-14: qty 1

## 2015-06-14 MED ORDER — CLINDAMYCIN HCL 150 MG PO CAPS: 450.0000 mg | ORAL_CAPSULE | Freq: Three times a day (TID) | ORAL | Status: DC

## 2015-06-14 NOTE — ED Notes (Signed)
Pt c/o slipping last night and hitting his mid back on the stairs. Pt also c/o of 3 knots on the lower right side of his back. Pt states he only used to have 1 knot and was told it was a cyst and now pt states he has 3.

## 2015-06-14 NOTE — ED Provider Notes (Signed)
CSN: [id number]     Arrival date & time 06/14/15  1846 History  By signing my name below, I, [facility], attest that this documentation has been prepared under the direction and in the presence of Heloise Beecham, PA-C. Electronically Signed: Virgel Bouquet, ED Scribe. 06/14/2015. 9:11 PM.     Chief Complaint  Patient presents with  . Back Pain   The history is provided by the patient. No language interpreter was used.    HPI Comments: Shawn Newman is a 44 y.o. male with an hx of HTN, DM, and chronic back pain who presents to the Emergency Department complaining of constant, moderate, unchanging mid-back pain that began last night after a fall. Patient reports that he slipped and fell on the stairs last night, striking his mid-back on the stairs. He endorses pain with deep inspiration but denies chest pain or sob. He has taken multiple pain medications including a prescription pain medication from his mother.   Pt also notes painful fatty tumors on right side of his lower back. He states that he originally had only one but noticed that 2 more have presented themselves since the last time he was seen for the complaint.  Pt also complains of an aching, painful boil on the right inner groin which is not currently draining. He has tried warm water soaks without relief. Per patient, he had a standing prescription for doxycyline for treatment of frequent abscesses. He has taken doxycycline for the past 4-5 days. Patient notes similar abscesses in the past which were I&Ded.  PCP: [facility] Past Medical History  Diagnosis Date  . Hypertension   . Diabetes mellitus   . Abscess   . Chronic back pain   . Sciatica   . Asthma   . Bronchitis   . OSA (obstructive sleep apnea)   . Sleep apnea     uses cpap  . Headache   . Chronic pain    Past Surgical History  Procedure Laterality Date  . No past surgeries     Family History  Problem Relation Age of Onset  . Hypertension  Mother   . Diabetes Mother   . Hypertension Father   . Diabetes Father   . Diabetes Brother   . Hypertension Brother   . Colon cancer Sister    Social History  Substance Use Topics  . Smoking status: Former Smoker -- 0.50 packs/day for 24 years    Types: Cigarettes    Start date: 06/20/1987    Quit date: 12/18/2011  . Smokeless tobacco: Never Used  . Alcohol Use: No    Review of Systems  Constitutional: Negative for fever.  Respiratory: Negative for shortness of breath.   Cardiovascular: Negative for chest pain and leg swelling.  Gastrointestinal: Negative for abdominal pain, constipation and abdominal distention.  Genitourinary: Negative for dysuria, urgency, frequency, flank pain and difficulty urinating.  Musculoskeletal: Positive for back pain (Mid-back). Negative for joint swelling and gait problem.  Skin: Positive for wound (Abscess inner groin). Negative for rash.  Neurological: Negative for weakness and numbness.      Allergies  Sulfa antibiotics  Home Medications   Prior to Admission medications   Medication Sig Start Date End Date Taking? Authorizing Provider  ALPRAZolam Duanne Moron) 1 MG tablet Take 1 mg by mouth 4 (four) times daily. 01/08/15   Historical Provider, MD  amLODipine (NORVASC) 10 MG tablet Take 10 mg by mouth daily. 08/06/14   Historical Provider, MD  aspirin EC 81 MG tablet Take  81 mg by mouth daily.    Historical Provider, MD  atorvastatin (LIPITOR) 20 MG tablet Take 20 mg by mouth at bedtime. 09/01/14   Historical Provider, MD  budesonide-formoterol (SYMBICORT) 160-4.5 MCG/ACT inhaler Inhale 2 puffs into the lungs daily as needed (for shortness of breath and wheezing).    Historical Provider, MD  buPROPion (WELLBUTRIN XL) 300 MG 24 hr tablet Take 300 mg by mouth every morning. 01/08/15   Historical Provider, MD  carvedilol (COREG) 12.5 MG tablet Take 12.5 mg by mouth 2 (two) times daily with a meal.    Historical Provider, MD  clindamycin (CLEOCIN) 150  MG capsule Take 3 capsules (450 mg total) by mouth 3 (three) times daily. 06/14/15   Evalee Jefferson, PA-C  cyclobenzaprine (FLEXERIL) 10 MG tablet Take 1 tablet (10 mg total) by mouth 2 (two) times daily as needed for muscle spasms. 05/22/15   Ezequiel Essex, MD  famotidine (PEPCID) 20 MG tablet take 1 tablet by mouth at bedtime 12/09/14   Tanda Rockers, MD  furosemide (LASIX) 20 MG tablet Take 1 tablet (20 mg total) by mouth daily. 11/02/13   Belkys A Regalado, MD  ibuprofen (ADVIL,MOTRIN) 800 MG tablet Take 1 tablet (800 mg total) by mouth 3 (three) times daily. 05/22/15   Ezequiel Essex, MD  insulin aspart (NOVOLOG) 100 UNIT/ML injection Inject 10 Units into the skin 3 (three) times daily with meals. 11/02/13   Belkys A Regalado, MD  insulin glargine (LANTUS) 100 UNIT/ML injection Inject 0.15 mLs (15 Units total) into the skin at bedtime. 11/02/13   Belkys A Regalado, MD  LANTUS SOLOSTAR 100 UNIT/ML Solostar Pen Inject 30 Units into the skin at bedtime. 08/28/14   Historical Provider, MD  latanoprost (XALATAN) 0.005 % ophthalmic solution Place 1 drop into both eyes at bedtime. 08/21/14   Historical Provider, MD  metFORMIN (GLUCOPHAGE) 1000 MG tablet Take 1,000 mg by mouth 2 (two) times daily with a meal.      Historical Provider, MD  metroNIDAZOLE (FLAGYL) 500 MG tablet Take 1 tablet (500 mg total) by mouth 2 (two) times daily. 11/09/13   Lacretia Leigh, MD  omeprazole (PRILOSEC) 40 MG capsule Take 40 mg by mouth daily. 01/14/15   Historical Provider, MD  oxyCODONE-acetaminophen (PERCOCET/ROXICET) 5-325 MG tablet Take 1 tablet by mouth every 4 (four) hours as needed. 06/14/15   Evalee Jefferson, PA-C  pantoprazole (PROTONIX) 40 MG tablet take 1 tablet by mouth 30 TO 60 MINUTES BEFORE FIRST MEAL OF THE DAY 11/25/14   Tanda Rockers, MD  testosterone cypionate (DEPOTESTOSTERONE CYPIONATE) 200 MG/ML injection Inject 1 mL into the muscle every 21 ( twenty-one) days. 12/17/14   Historical Provider, MD  VYVANSE 50 MG capsule  Take 50 mg by mouth every morning. 01/08/15   Historical Provider, MD   BP 107/57 mmHg  Pulse 90  Temp(Src) 98.1 F (36.7 C) (Oral)  Resp 18  Ht 5\' 11"  (1.803 m)  Wt 180.532 kg  BMI 55.53 kg/m2  SpO2 94% Physical Exam  Constitutional: He appears well-developed and well-nourished.  Morbidly obese.   HENT:  Head: Normocephalic.  Eyes: Conjunctivae are normal.  Neck: Normal range of motion. Neck supple.  Cardiovascular: Normal rate and intact distal pulses.   Pedal pulses normal.  Pulmonary/Chest: Effort normal.  Abdominal: Soft. Bowel sounds are normal. He exhibits no distension and no mass.  Musculoskeletal: Normal range of motion. He exhibits no edema.       Lumbar back: He exhibits tenderness. He exhibits no  swelling, no edema and no spasm.  Neurological: He is alert. He has normal strength. He displays no atrophy and no tremor. No sensory deficit. Gait normal.  Reflex Scores:      Patellar reflexes are 2+ on the right side and 2+ on the left side.      Achilles reflexes are 2+ on the right side and 2+ on the left side. No strength deficit noted in hip and knee flexor and extensor muscle groups.  Ankle flexion and extension intact.  Skin: Skin is warm and dry.  1 cm abscess on his right groin. No spontaneous drainage. No surrounding redness or induration. There is central fluctuance. Small round mobile nodules right lower back suggesting lipomas  Psychiatric: He has a normal mood and affect.  Nursing note and vitals reviewed.  Chaperone was present during exam.  ED Course  Procedures   DIAGNOSTIC STUDIES: Oxygen Saturation is 94% on RA, adequate by my interpretation.    COORDINATION OF CARE: 8:55 PM Will order back x-ray, pain medication and antibiotics. Discussed I&Ding the abscess but pt declined and wished to continue warm water soaks. Discussed treatment plan with pt at bedside and pt agreed to plan.  Labs Review Labs Reviewed - No data to display  Imaging  Review Dg Lumbar Spine Complete  06/14/2015  CLINICAL DATA:  Fall backwards EXAM: LUMBAR SPINE - COMPLETE 4+ VIEW COMPARISON:  01/14/2013 FINDINGS: There is no evidence of lumbar spine fracture. Alignment is normal. Intervertebral disc spaces are maintained. IMPRESSION: Negative. Electronically Signed   By: Kerby Moors M.D.   On: 06/14/2015 21:31   I have personally reviewed and evaluated these images and lab results as part of my medical decision-making.   EKG Interpretation None      MDM   Final diagnoses:  Low back strain, initial encounter  Abscess of groin, right    No neuro deficit on exam or by history to suggest emergent or surgical presentation.  Also discussed worsened sx that should prompt immediate re-evaluation including distal weakness, bowel/bladder retention/incontinence.  Pt was prescribed oxycodone.  Switched form doxycycline to clindamycin for persistent small abscess , continued warm soaks. F/u with pcp if sx persist or worsen.  The patient appears reasonably screened and/or stabilized for discharge and I doubt any other medical condition or other [facility] requiring further screening, evaluation, or treatment in the ED at this time prior to discharge.  I personally performed the services described in this documentation, which was scribed in my presence. The recorded information has been reviewed and is accurate.    Evalee Jefferson, PA-C 06/15/15 1245  Ripley Fraise, MD 06/15/15 [phone]

## 2015-06-14 NOTE — Discharge Instructions (Signed)
Abscess An abscess is an infected area that contains a collection of pus and debris.It can occur in almost any part of the body. An abscess is also known as a furuncle or boil. CAUSES  An abscess occurs when tissue gets infected. This can occur from blockage of oil or sweat glands, infection of hair follicles, or a minor injury to the skin. As the body tries to fight the infection, pus collects in the area and creates pressure under the skin. This pressure causes pain. People with weakened immune systems have difficulty fighting infections and get certain abscesses more often.  SYMPTOMS Usually an abscess develops on the skin and becomes a painful mass that is red, warm, and tender. If the abscess forms under the skin, you may feel a moveable soft area under the skin. Some abscesses break open (rupture) on their own, but most will continue to get worse without care. The infection can spread deeper into the body and eventually into the bloodstream, causing you to feel ill.  DIAGNOSIS  Your caregiver will take your medical history and perform a physical exam. A sample of fluid may also be taken from the abscess to determine what is causing your infection. TREATMENT  Your caregiver may prescribe antibiotic medicines to fight the infection. However, taking antibiotics alone usually does not cure an abscess. Your caregiver may need to make a small cut (incision) in the abscess to drain the pus. In some cases, gauze is packed into the abscess to reduce pain and to continue draining the area. HOME CARE INSTRUCTIONS   Only take over-the-counter or prescription medicines for pain, discomfort, or fever as directed by your caregiver.  If you were prescribed antibiotics, take them as directed. Finish them even if you start to feel better.  If gauze is used, follow your caregiver's directions for changing the gauze.  To avoid spreading the infection:  Keep your draining abscess covered with a  bandage.  Wash your hands well.  Do not share personal care items, towels, or whirlpools with others.  Avoid skin contact with others.  Keep your skin and clothes clean around the abscess.  Keep all follow-up appointments as directed by your caregiver. SEEK MEDICAL CARE IF:   You have increased pain, swelling, redness, fluid drainage, or bleeding.  You have muscle aches, chills, or a general ill feeling.  You have a fever. MAKE SURE YOU:   Understand these instructions.  Will watch your condition.  Will get help right away if you are not doing well or get worse.   This information is not intended to replace advice given to you by your health care provider. Make sure you discuss any questions you have with your health care provider.   Document Released: 03/15/2005 Document Revised: 12/05/2011 Document Reviewed: 08/18/2011 Elsevier Interactive Patient Education 2016 [employer]  Lumbosacral Strain Lumbosacral strain is a strain of any of the parts that make up your lumbosacral vertebrae. Your lumbosacral vertebrae are the bones that make up the lower third of your backbone. Your lumbosacral vertebrae are held together by muscles and tough, fibrous tissue (ligaments).  CAUSES  A sudden blow to your back can cause lumbosacral strain. Also, anything that causes an excessive stretch of the muscles in the low back can cause this strain. This is typically seen when people exert themselves strenuously, fall, lift heavy objects, bend, or crouch repeatedly. RISK FACTORS  Physically demanding work.  Participation in pushing or pulling sports or sports that require a sudden twist  of the back (tennis, golf, baseball).  Weight lifting.  Excessive lower back curvature.  Forward-tilted pelvis.  Weak back or abdominal muscles or both.  Tight hamstrings. SIGNS AND SYMPTOMS  Lumbosacral strain may cause pain in the area of your injury or pain that moves (radiates) down your leg.   DIAGNOSIS Your health care provider can often diagnose lumbosacral strain through a physical exam. In some cases, you may need tests such as X-ray exams.  TREATMENT  Treatment for your lower back injury depends on many factors that your clinician will have to evaluate. However, most treatment will include the use of anti-inflammatory medicines. HOME CARE INSTRUCTIONS   Avoid hard physical activities (tennis, racquetball, waterskiing) if you are not in proper physical condition for it. This may aggravate or create problems.  If you have a back problem, avoid sports requiring sudden body movements. Swimming and walking are generally safer activities.  Maintain good posture.  Maintain a healthy weight.  For acute conditions, you may put ice on the injured area.  Put ice in a plastic bag.  Place a towel between your skin and the bag.  Leave the ice on for 20 minutes, 2-3 times a day.  When the low back starts healing, stretching and strengthening exercises may be recommended. SEEK MEDICAL CARE IF:  Your back pain is getting worse.  You experience severe back pain not relieved with medicines. SEEK IMMEDIATE MEDICAL CARE IF:   You have numbness, tingling, weakness, or problems with the use of your arms or legs.  There is a change in bowel or bladder control.  You have increasing pain in any area of the body, including your belly (abdomen).  You notice shortness of breath, dizziness, or feel faint.  You feel sick to your stomach (nauseous), are throwing up (vomiting), or become sweaty.  You notice discoloration of your toes or legs, or your feet get very cold. MAKE SURE YOU:   Understand these instructions.  Will watch your condition.  Will get help right away if you are not doing well or get worse.   This information is not intended to replace advice given to you by your health care provider. Make sure you discuss any questions you have with your health care provider.    Document Released: 03/15/2005 Document Revised: 06/26/2014 Document Reviewed: 01/22/2013 Elsevier Interactive Patient Education [employer].

## 2015-07-16 ENCOUNTER — Encounter (HOSPITAL_COMMUNITY): Payer: Self-pay

## 2015-07-16 ENCOUNTER — Emergency Department (HOSPITAL_COMMUNITY): Payer: Medicaid Other

## 2015-07-16 ENCOUNTER — Emergency Department (HOSPITAL_COMMUNITY)
Admission: EM | Admit: 2015-07-16 | Discharge: 2015-07-16 | Disposition: A | Discharge status: Home / Self Care | Payer: Medicaid Other | Attending: Emergency Medicine | Admitting: Emergency Medicine

## 2015-07-16 DIAGNOSIS — E669 Obesity, unspecified: Secondary | ICD-10-CM | POA: Diagnosis not present

## 2015-07-16 DIAGNOSIS — M549 Dorsalgia, unspecified: Secondary | ICD-10-CM | POA: Insufficient documentation

## 2015-07-16 DIAGNOSIS — Z7984 Long term (current) use of oral hypoglycemic drugs: Secondary | ICD-10-CM | POA: Insufficient documentation

## 2015-07-16 DIAGNOSIS — Z87891 Personal history of nicotine dependence: Secondary | ICD-10-CM | POA: Insufficient documentation

## 2015-07-16 DIAGNOSIS — G4733 Obstructive sleep apnea (adult) (pediatric): Secondary | ICD-10-CM | POA: Diagnosis not present

## 2015-07-16 DIAGNOSIS — Z9981 Dependence on supplemental oxygen: Secondary | ICD-10-CM | POA: Insufficient documentation

## 2015-07-16 DIAGNOSIS — Z794 Long term (current) use of insulin: Secondary | ICD-10-CM | POA: Diagnosis not present

## 2015-07-16 DIAGNOSIS — G8929 Other chronic pain: Secondary | ICD-10-CM | POA: Insufficient documentation

## 2015-07-16 DIAGNOSIS — E119 Type 2 diabetes mellitus without complications: Secondary | ICD-10-CM | POA: Insufficient documentation

## 2015-07-16 DIAGNOSIS — M545 Low back pain, unspecified: Secondary | ICD-10-CM

## 2015-07-16 DIAGNOSIS — K5900 Constipation, unspecified: Secondary | ICD-10-CM | POA: Diagnosis not present

## 2015-07-16 DIAGNOSIS — I10 Essential (primary) hypertension: Secondary | ICD-10-CM | POA: Insufficient documentation

## 2015-07-16 DIAGNOSIS — Z872 Personal history of diseases of the skin and subcutaneous tissue: Secondary | ICD-10-CM | POA: Insufficient documentation

## 2015-07-16 DIAGNOSIS — Z79899 Other long term (current) drug therapy: Secondary | ICD-10-CM | POA: Diagnosis not present

## 2015-07-16 DIAGNOSIS — J45909 Unspecified asthma, uncomplicated: Secondary | ICD-10-CM | POA: Insufficient documentation

## 2015-07-16 DIAGNOSIS — R079 Chest pain, unspecified: Secondary | ICD-10-CM | POA: Insufficient documentation

## 2015-07-16 DIAGNOSIS — Z7982 Long term (current) use of aspirin: Secondary | ICD-10-CM | POA: Insufficient documentation

## 2015-07-16 LAB — URINE MICROSCOPIC-ADD ON
SQUAMOUS EPITHELIAL / LPF: NONE SEEN
WBC, UA: NONE SEEN WBC/hpf (ref 0–5)

## 2015-07-16 LAB — BASIC METABOLIC PANEL
ANION GAP: 11 (ref 5–15)
BUN: 13 mg/dL (ref 6–20)
CALCIUM: 9.5 mg/dL (ref 8.9–10.3)
CHLORIDE: 108 mmol/L (ref 101–111)
CO2: 23 mmol/L (ref 22–32)
Creatinine, Ser: 0.71 mg/dL (ref 0.61–1.24)
GFR calc Af Amer: >60 mL/min (ref >60)
GFR calc non Af Amer: >60 mL/min (ref >60)
Glucose, Bld: 101 mg/dL — ABNORMAL HIGH (ref 65–99)
Potassium: 3.7 mmol/L (ref 3.5–5.1)
Sodium: 142 mmol/L (ref 135–145)

## 2015-07-16 LAB — URINALYSIS, ROUTINE W REFLEX MICROSCOPIC
BILIRUBIN URINE: NEGATIVE
Glucose, UA: >1000 mg/dL — AB
HGB URINE DIPSTICK: NEGATIVE
Ketones, ur: 40 mg/dL — AB
Leukocytes, UA: NEGATIVE
Nitrite: NEGATIVE
PH: 5.5 (ref 5.0–8.0)
SPECIFIC GRAVITY, URINE: 1.015 (ref 1.005–1.030)

## 2015-07-16 LAB — HEPATIC FUNCTION PANEL
ALK PHOS: 58 U/L (ref 38–126)
ALT: 29 U/L (ref 17–63)
AST: 21 U/L (ref 15–41)
Albumin: 4.1 g/dL (ref 3.5–5.0)
BILIRUBIN INDIRECT: 0.7 mg/dL (ref 0.3–0.9)
BILIRUBIN TOTAL: 0.9 mg/dL (ref 0.3–1.2)
Bilirubin, Direct: 0.2 mg/dL (ref 0.1–0.5)
TOTAL PROTEIN: 7.9 g/dL (ref 6.5–8.1)

## 2015-07-16 LAB — CBC WITH DIFFERENTIAL/PLATELET
BASOS PCT: 0 %
Basophils Absolute: 0 10*3/uL (ref 0.0–0.1)
Eosinophils Absolute: 0.2 10*3/uL (ref 0.0–0.7)
Eosinophils Relative: 2 %
HCT: 48.1 % (ref 39.0–52.0)
HEMOGLOBIN: 15.5 g/dL (ref 13.0–17.0)
Lymphocytes Relative: 46 %
Lymphs Abs: 4.2 10*3/uL — ABNORMAL HIGH (ref 0.7–4.0)
MCH: 27.9 pg (ref 26.0–34.0)
MCHC: 32.2 g/dL (ref 30.0–36.0)
MCV: 86.5 fL (ref 78.0–100.0)
Monocytes Absolute: 0.5 10*3/uL (ref 0.1–1.0)
Monocytes Relative: 5 %
NEUTROS ABS: 4.3 10*3/uL (ref 1.7–7.7)
NEUTROS PCT: 47 %
Platelets: 261 10*3/uL (ref 150–400)
RBC: 5.56 MIL/uL (ref 4.22–5.81)
RDW: 13.8 % (ref 11.5–15.5)
WBC: 9.2 10*3/uL (ref 4.0–10.5)

## 2015-07-16 LAB — I-STAT TROPONIN, ED: Troponin i, poc: 0.01 ng/mL (ref 0.00–0.08)

## 2015-07-16 LAB — TROPONIN I: Troponin I: <0.03 ng/mL (ref <0.031)

## 2015-07-16 MED ORDER — OXYCODONE-ACETAMINOPHEN 5-325 MG PO TABS: 1.0000 | ORAL_TABLET | Freq: Four times a day (QID) | ORAL | Status: DC | PRN

## 2015-07-16 MED ORDER — HYDROCODONE-ACETAMINOPHEN 5-325 MG PO TABS
1.0000 | ORAL_TABLET | Freq: Once | ORAL | Status: AC
  Administered 2015-07-16: 1 via ORAL
  Filled 2015-07-16: qty 1

## 2015-07-16 NOTE — ED Notes (Signed)
Pt reports left sided chest pain radiating down left arm x 3 days.  Reports numbness in both legs, back pain, and constipation also.

## 2015-07-16 NOTE — ED Notes (Signed)
Pt also stating "I think I might have a kidney infection".  UA collected and sent to lab.

## 2015-07-16 NOTE — Discharge Instructions (Signed)
Take magnesium citrate for constipation.   Follow up with cardiology in [place] next week

## 2015-07-16 NOTE — ED Provider Notes (Signed)
CSN: [id number]     Arrival date & time 07/16/15  1745 History  By signing my name below, I, Meriel Pica, attest that this documentation has been prepared under the direction and in the presence of Milton Ferguson, MD. Electronically Signed: Meriel Pica, ED Scribe. 07/16/2015. 8:40 PM.   Chief Complaint  Patient presents with  . Chest Pain   The history is provided by the patient. No language interpreter was used.   HPI Comments: Shawn Newman is a 45 y.o. male, with a h/o HTN, DM II, obesity, and sciatica, who presents to the Emergency Department complaining of intermittent, shooting, left lateral chest pain that radiates down his anterior left arm X 3 days. He denies any modifying factors to this chest pain or associated symptoms of diaphoresis, SOB, or nausea. He describes experiencing a similar chest pain in the past but denies a h/o cardiac catheterization or receiving a stress test. He is not currently followed by a PCP.   Pt also c/o constipation and lower back pain that is chronic in nature.    Past Medical History  Diagnosis Date  . Hypertension   . Diabetes mellitus   . Abscess   . Chronic back pain   . Sciatica   . Asthma   . Bronchitis   . OSA (obstructive sleep apnea)   . Sleep apnea     uses cpap  . Headache   . Chronic pain    Past Surgical History  Procedure Laterality Date  . No past surgeries     Family History  Problem Relation Age of Onset  . Hypertension Mother   . Diabetes Mother   . Hypertension Father   . Diabetes Father   . Diabetes Brother   . Hypertension Brother   . Colon cancer Sister    Social History  Substance Use Topics  . Smoking status: Former Smoker -- 0.50 packs/day for 24 years    Types: Cigarettes    Start date: 06/20/1987    Quit date: 12/18/2011  . Smokeless tobacco: Never Used  . Alcohol Use: No    Review of Systems  Constitutional: Negative for diaphoresis.  Respiratory: Negative for shortness of breath.    Cardiovascular: Positive for chest pain.  Gastrointestinal: Positive for constipation. Negative for nausea.  Musculoskeletal: Positive for back pain.  All other systems reviewed and are negative.  Allergies  Sulfa antibiotics  Home Medications   Prior to Admission medications   Medication Sig Start Date End Date Taking? Authorizing Provider  ALPRAZolam Duanne Moron) 1 MG tablet Take 1 mg by mouth 4 (four) times daily. 01/08/15  Yes Historical Provider, MD  amLODipine (NORVASC) 10 MG tablet Take 10 mg by mouth daily. 08/06/14  Yes Historical Provider, MD  aspirin EC 81 MG tablet Take 81 mg by mouth daily.   Yes Historical Provider, MD  atorvastatin (LIPITOR) 20 MG tablet Take 20 mg by mouth at bedtime. 09/01/14  Yes Historical Provider, MD  budesonide-formoterol (SYMBICORT) 160-4.5 MCG/ACT inhaler Inhale 2 puffs into the lungs daily as needed (for shortness of breath and wheezing).   Yes Historical Provider, MD  buPROPion (WELLBUTRIN XL) 300 MG 24 hr tablet Take 300 mg by mouth every morning. 01/08/15  Yes Historical Provider, MD  carvedilol (COREG) 12.5 MG tablet Take 12.5 mg by mouth 2 (two) times daily with a meal.   Yes Historical Provider, MD  furosemide (LASIX) 20 MG tablet Take 1 tablet (20 mg total) by mouth daily. 11/02/13  Yes [place],  MD  gabapentin (NEURONTIN) 100 MG capsule take 1 capsule by mouth every 8 hours 07/06/15  Yes Historical Provider, MD  insulin aspart (NOVOLOG) 100 UNIT/ML injection Inject 10 Units into the skin 3 (three) times daily with meals. 11/02/13  Yes Belkys A Regalado, MD  LANTUS SOLOSTAR 100 UNIT/ML Solostar Pen Inject 30 Units into the skin at bedtime. 08/28/14  Yes Historical Provider, MD  latanoprost (XALATAN) 0.005 % ophthalmic solution Place 1 drop into both eyes at bedtime. 08/21/14  Yes Historical Provider, MD  metFORMIN (GLUCOPHAGE) 1000 MG tablet Take 1,000 mg by mouth 2 (two) times daily with a meal.     Yes Historical Provider, MD  omeprazole  (PRILOSEC) 40 MG capsule Take 40 mg by mouth daily. 01/14/15  Yes Historical Provider, MD  VYVANSE 70 MG capsule Take 70 mg by mouth every morning.  07/03/15  Yes Historical Provider, MD  clindamycin (CLEOCIN) 150 MG capsule Take 3 capsules (450 mg total) by mouth 3 (three) times daily. Patient not taking: Reported on 07/16/2015 06/14/15   Evalee Jefferson, PA-C  cyclobenzaprine (FLEXERIL) 10 MG tablet Take 1 tablet (10 mg total) by mouth 2 (two) times daily as needed for muscle spasms. Patient not taking: Reported on 07/16/2015 05/22/15   Ezequiel Essex, MD  famotidine (PEPCID) 20 MG tablet take 1 tablet by mouth at bedtime Patient not taking: Reported on 07/16/2015 12/09/14   Tanda Rockers, MD  ibuprofen (ADVIL,MOTRIN) 800 MG tablet Take 1 tablet (800 mg total) by mouth 3 (three) times daily. Patient not taking: Reported on 07/16/2015 05/22/15   Ezequiel Essex, MD  pantoprazole (PROTONIX) 40 MG tablet take 1 tablet by mouth 30 TO 60 [place] DAY Patient not taking: Reported on 07/16/2015 11/25/14   Tanda Rockers, MD   BP 124/66 mmHg  Pulse 93  Temp(Src) 98.7 F (37.1 C) (Oral)  Resp 20  Ht 5\' 11"  (1.803 m)  Wt 394 lb (178.717 kg)  BMI 54.98 kg/m2  SpO2 95% Physical Exam  Constitutional: He is oriented to person, place, and time. He appears well-developed.  Pt is obese.   HENT:  Head: Normocephalic.  Eyes: Conjunctivae and EOM are normal. No scleral icterus.  Neck: Neck supple. No thyromegaly present.  Cardiovascular: Normal rate and regular rhythm.  Exam reveals no gallop and no friction rub.   No murmur heard. Pulmonary/Chest: No stridor. He has no wheezes. He has no rales. He exhibits no tenderness.  Abdominal: He exhibits no distension. There is no tenderness. There is no rebound.  Musculoskeletal: Normal range of motion. He exhibits no edema.  Lymphadenopathy:    He has no cervical adenopathy.  Neurological: He is oriented to person, place, and time. He exhibits  normal muscle tone. Coordination normal.  Skin: No rash noted. No erythema.  Psychiatric: He has a normal mood and affect. His behavior is normal.    ED Course  Procedures  DIAGNOSTIC STUDIES: Oxygen Saturation is 95% on RA, adequate by my interpretation.    COORDINATION OF CARE: 8:26 PM Discussed treatment plan which includes to order diagnostic labs and Xray of abdomen with pt. Pt acknowledges and agrees to plan.   Labs Review Labs Reviewed  CBC WITH DIFFERENTIAL/PLATELET - Abnormal; Notable for the following:    Lymphs Abs 4.2 (*)    All other components within normal limits  BASIC METABOLIC PANEL - Abnormal; Notable for the following:    Glucose, Bld 101 (*)    All other components within  normal limits  URINALYSIS, ROUTINE W REFLEX MICROSCOPIC (NOT AT [facility]) - Abnormal; Notable for the following:    Glucose, UA >1000 (*)    Ketones, ur 40 (*)    Protein, ur TRACE (*)    All other components within normal limits  URINE MICROSCOPIC-ADD ON - Abnormal; Notable for the following:    Bacteria, UA RARE (*)    All other components within normal limits  TROPONIN I  HEPATIC FUNCTION PANEL    Imaging Review Dg Chest 2 View  07/16/2015  CLINICAL DATA:  Left-sided chest pain radiating to left arm for 3 days. Chronic shortness of breath. Asthma, diabetes, and hypertension. Morbid obesity. EXAM: CHEST  2 VIEW COMPARISON:  None. FINDINGS: The heart size and mediastinal contours are within normal limits. Mild left mid lung scarring again noted. No evidence of pulmonary infiltrate or edema. No evidence of pneumothorax or pleural effusion. The visualized skeletal structures are unremarkable. IMPRESSION: Stable left lung scarring.  No active disease. Electronically Signed   By: Earle Gell M.D.   On: 07/16/2015 18:47   Dg Abd 1 View  07/16/2015  CLINICAL DATA:  I initial evaluation for generalized abdominal pain and constipation for 3 days. EXAM: ABDOMEN - 1 VIEW COMPARISON:  Prior study from  06/14/2015. FINDINGS: Study somewhat limited by patient's body habitus. Visualized bowel gas pattern within normal limits without evidence for obstruction or ileus. No abnormal bowel wall thickening. Moderate large amount of retained stool within the colon. No soft tissue mass or abnormal calcification. No definite free air on these limited supine views. Degenerative changes noted within the lower lumbar spine. No acute osseous abnormality. IMPRESSION: Moderate to large amount of retained stool within the visualized colon, suggesting constipation. Electronically Signed   By: Jeannine Boga M.D.   On: 07/16/2015 21:15   I have personally reviewed and evaluated these images and lab results as part of my medical decision-making.   EKG Interpretation   Date/Time:  Friday July 16 2015 18:06:11 EST Ventricular Rate:  85 PR Interval:  144 QRS Duration: 102 QT Interval:  378 QTC Calculation: 449 R Axis:   -24 Text Interpretation:  Normal sinus rhythm Incomplete right bundle branch  block Borderline ECG Confirmed by Elwyn Lowden  MD, Jarrette Dehner ([id number]) on 07/16/2015  8:22:40 PM      MDM   Final diagnoses:  None    Left-sided chest pain has resolved. Pain does not worsen with exertion.   Troponin 2 normal EKG normal chest x-ray unremarkable. Abdominal films do show constipation. Patient will be referred to cardiology for further workup  The chart was scribed for me under my direct supervision.  I personally performed the history, physical, and medical decision making and all procedures in the evaluation of this patient.Milton Ferguson, MD 07/16/15 2308

## 2015-09-16 ENCOUNTER — Emergency Department (HOSPITAL_COMMUNITY): Payer: Medicaid Other

## 2015-09-16 ENCOUNTER — Emergency Department (HOSPITAL_COMMUNITY)
Admission: EM | Admit: 2015-09-16 | Discharge: 2015-09-17 | Disposition: A | Discharge status: Home / Self Care | Payer: Medicaid Other | Attending: Emergency Medicine | Admitting: Emergency Medicine

## 2015-09-16 ENCOUNTER — Encounter (HOSPITAL_COMMUNITY): Payer: Self-pay

## 2015-09-16 DIAGNOSIS — R062 Wheezing: Secondary | ICD-10-CM | POA: Diagnosis not present

## 2015-09-16 DIAGNOSIS — Z87891 Personal history of nicotine dependence: Secondary | ICD-10-CM | POA: Insufficient documentation

## 2015-09-16 DIAGNOSIS — E119 Type 2 diabetes mellitus without complications: Secondary | ICD-10-CM | POA: Insufficient documentation

## 2015-09-16 DIAGNOSIS — Z7982 Long term (current) use of aspirin: Secondary | ICD-10-CM | POA: Diagnosis not present

## 2015-09-16 DIAGNOSIS — Z794 Long term (current) use of insulin: Secondary | ICD-10-CM | POA: Insufficient documentation

## 2015-09-16 DIAGNOSIS — M546 Pain in thoracic spine: Secondary | ICD-10-CM | POA: Insufficient documentation

## 2015-09-16 DIAGNOSIS — R0789 Other chest pain: Secondary | ICD-10-CM | POA: Insufficient documentation

## 2015-09-16 DIAGNOSIS — R0602 Shortness of breath: Secondary | ICD-10-CM | POA: Diagnosis not present

## 2015-09-16 DIAGNOSIS — I10 Essential (primary) hypertension: Secondary | ICD-10-CM | POA: Insufficient documentation

## 2015-09-16 DIAGNOSIS — Z79899 Other long term (current) drug therapy: Secondary | ICD-10-CM | POA: Diagnosis not present

## 2015-09-16 DIAGNOSIS — Z8709 Personal history of other diseases of the respiratory system: Secondary | ICD-10-CM | POA: Insufficient documentation

## 2015-09-16 LAB — BASIC METABOLIC PANEL
Anion gap: 6 (ref 5–15)
BUN: 12 mg/dL (ref 6–20)
CHLORIDE: 105 mmol/L (ref 101–111)
CO2: 23 mmol/L (ref 22–32)
Calcium: 8.7 mg/dL — ABNORMAL LOW (ref 8.9–10.3)
Creatinine, Ser: 0.6 mg/dL — ABNORMAL LOW (ref 0.61–1.24)
GFR calc non Af Amer: >60 mL/min (ref >60)
Glucose, Bld: 182 mg/dL — ABNORMAL HIGH (ref 65–99)
POTASSIUM: 3.4 mmol/L — AB (ref 3.5–5.1)
SODIUM: 134 mmol/L — AB (ref 135–145)

## 2015-09-16 LAB — CBC
HEMATOCRIT: 44.4 % (ref 39.0–52.0)
HEMOGLOBIN: 14.3 g/dL (ref 13.0–17.0)
MCH: 27.5 pg (ref 26.0–34.0)
MCHC: 32.2 g/dL (ref 30.0–36.0)
MCV: 85.4 fL (ref 78.0–100.0)
Platelets: 277 10*3/uL (ref 150–400)
RBC: 5.2 MIL/uL (ref 4.22–5.81)
RDW: 13.8 % (ref 11.5–15.5)
WBC: 7.6 10*3/uL (ref 4.0–10.5)

## 2015-09-16 MED ORDER — ALBUTEROL SULFATE (2.5 MG/3ML) 0.083% IN NEBU
5.0000 mg | INHALATION_SOLUTION | Freq: Once | RESPIRATORY_TRACT | Status: AC
  Administered 2015-09-16: 5 mg via RESPIRATORY_TRACT
  Filled 2015-09-16: qty 6

## 2015-09-16 MED ORDER — IPRATROPIUM BROMIDE 0.02 % IN SOLN
0.5000 mg | Freq: Once | RESPIRATORY_TRACT | Status: AC
  Administered 2015-09-16: 0.5 mg via RESPIRATORY_TRACT
  Filled 2015-09-16: qty 2.5

## 2015-09-16 MED ORDER — KETOROLAC TROMETHAMINE 60 MG/2ML IM SOLN: 60.0000 mg | Freq: Once | INTRAMUSCULAR | Status: DC

## 2015-09-16 NOTE — ED Notes (Signed)
Patient on cardiac monitor at this time. 

## 2015-09-16 NOTE — ED Provider Notes (Signed)
CSN: [id number]     Arrival date & time 09/16/15  2145 History  By signing my name below, I, [facility], attest that this documentation has been prepared under the direction and in the presence of Rolland Porter, MD at 2320 Electronically Signed: Virgel Bouquet, ED Scribe. 09/16/2015. 1:17 AM.   Chief Complaint  Patient presents with  . Chest Pain   The history is provided by the patient. No language interpreter was used.   HPI Comments: Shawn Newman is a 44 y.o. male with a PMHx of HTN, DM, asthma, with chronic lower back pain who presents to the Emergency Department complaining of constant, gradually worsening CP onset yesterday. Patient reports chest tightness across his whole chest, upper back pain, and SOB since yesterday. He endorses associated wheezing today and intermittent cough. Per patient, he has an hx of asthma and uses a CPAP with oxygen at night but denies constant home O2. Denies smoking and EtOH consumption. Denies fevers, nausea, vomiting, diarrhea, rhinorrhea.  PCP: [facility]   Past Medical History  Diagnosis Date  . Hypertension   . Diabetes mellitus   . Abscess   . Chronic back pain   . Sciatica   . Asthma   . Bronchitis   . OSA (obstructive sleep apnea)   . Sleep apnea     uses cpap  . Headache   . Chronic pain    Past Surgical History  Procedure Laterality Date  . No past surgeries     Family History  Problem Relation Age of Onset  . Hypertension Mother   . Diabetes Mother   . Hypertension Father   . Diabetes Father   . Diabetes Brother   . Hypertension Brother   . Colon cancer Sister    Social History  Substance Use Topics  . Smoking status: Former Smoker -- 0.50 packs/day for 24 years    Types: Cigarettes    Start date: 06/20/1987    Quit date: 12/18/2011  . Smokeless tobacco: Never Used  . Alcohol Use: No  on disability for diabetes and sleep apnea Uses oxygen  2 lpm [place] with CPAP at bedtime  Review  of Systems  Constitutional: Negative for fever.  HENT: Negative for rhinorrhea.   Respiratory: Positive for cough, chest tightness, shortness of breath and wheezing.   Cardiovascular: Positive for chest pain.  Gastrointestinal: Negative for nausea, vomiting and diarrhea.  Musculoskeletal: Positive for back pain.  All other systems reviewed and are negative.     Allergies  Sulfa antibiotics  Home Medications   Prior to Admission medications   Medication Sig Start Date End Date Taking? Authorizing Provider  ALPRAZolam Duanne Moron) 1 MG tablet Take 1 mg by mouth 4 (four) times daily. 01/08/15   Historical Provider, MD  amLODipine (NORVASC) 10 MG tablet Take 10 mg by mouth daily. 08/06/14   Historical Provider, MD  aspirin EC 81 MG tablet Take 81 mg by mouth daily.    Historical Provider, MD  atorvastatin (LIPITOR) 20 MG tablet Take 20 mg by mouth at bedtime. 09/01/14   Historical Provider, MD  budesonide-formoterol (SYMBICORT) 160-4.5 MCG/ACT inhaler Inhale 2 puffs into the lungs daily as needed (for shortness of breath and wheezing).    Historical Provider, MD  buPROPion (WELLBUTRIN XL) 300 MG 24 hr tablet Take 300 mg by mouth every morning. 01/08/15   Historical Provider, MD  carvedilol (COREG) 12.5 MG tablet Take 12.5 mg by mouth 2 (two) times daily with a meal.  Historical Provider, MD  clindamycin (CLEOCIN) 150 MG capsule Take 3 capsules (450 mg total) by mouth 3 (three) times daily. Patient not taking: Reported on 07/16/2015 06/14/15   Evalee Jefferson, PA-C  cyclobenzaprine (FLEXERIL) 10 MG tablet Take 1 tablet (10 mg total) by mouth 2 (two) times daily as needed for muscle spasms. Patient not taking: Reported on 07/16/2015 05/22/15   Ezequiel Essex, MD  famotidine (PEPCID) 20 MG tablet take 1 tablet by mouth at bedtime Patient not taking: Reported on 07/16/2015 12/09/14   Tanda Rockers, MD  furosemide (LASIX) 20 MG tablet Take 1 tablet (20 mg total) by mouth daily. 11/02/13   Belkys A Regalado,  MD  gabapentin (NEURONTIN) 100 MG capsule take 1 capsule by mouth every 8 hours 07/06/15   Historical Provider, MD  ibuprofen (ADVIL,MOTRIN) 800 MG tablet Take 1 tablet (800 mg total) by mouth 3 (three) times daily. Patient not taking: Reported on 07/16/2015 05/22/15   Ezequiel Essex, MD  insulin aspart (NOVOLOG) 100 UNIT/ML injection Inject 10 Units into the skin 3 (three) times daily with meals. 11/02/13   Belkys A Regalado, MD  LANTUS SOLOSTAR 100 UNIT/ML Solostar Pen Inject 30 Units into the skin at bedtime. 08/28/14   Historical Provider, MD  latanoprost (XALATAN) 0.005 % ophthalmic solution Place 1 drop into both eyes at bedtime. 08/21/14   Historical Provider, MD  metFORMIN (GLUCOPHAGE) 1000 MG tablet Take 1,000 mg by mouth 2 (two) times daily with a meal.      Historical Provider, MD  omeprazole (PRILOSEC) 40 MG capsule Take 40 mg by mouth daily. 01/14/15   Historical Provider, MD  oxyCODONE-acetaminophen (PERCOCET/ROXICET) 5-325 MG tablet Take 1-2 tablets by mouth every 6 (six) hours as needed. 07/16/15   Milton Ferguson, MD  pantoprazole (PROTONIX) 40 MG tablet take 1 tablet by mouth 30 TO 60 [place] DAY Patient not taking: Reported on 07/16/2015 11/25/14   Tanda Rockers, MD  VYVANSE 70 MG capsule Take 70 mg by mouth every morning.  07/03/15   Historical Provider, MD   BP 141/71 mmHg  Pulse 80  Temp(Src) 98.1 F (36.7 C) (Oral)  Resp 24  Ht 5\' 11"  (1.803 m)  Wt 391 lb (177.356 kg)  BMI 54.56 kg/m2  SpO2 95%  Vital signs normal   Physical Exam  Constitutional: He is oriented to person, place, and time. He appears well-developed and well-nourished.  Non-toxic appearance. He does not appear ill. No distress.  Morbidly obsese  HENT:  Head: Normocephalic and atraumatic.  Right Ear: External ear normal.  Left Ear: External ear normal.  Nose: Nose normal. No mucosal edema or rhinorrhea.  Mouth/Throat: Oropharynx is clear and moist and mucous membranes are normal. No  dental abscesses or uvula swelling.  Eyes: Conjunctivae and EOM are normal. Pupils are equal, round, and reactive to light.  Neck: Normal range of motion and full passive range of motion without pain. Neck supple.  Cardiovascular: Normal rate, regular rhythm and normal heart sounds.  Exam reveals no gallop and no friction rub.   No murmur heard. Pulmonary/Chest: Effort normal and breath sounds normal. No respiratory distress. He has no wheezes. He has no rhonchi. He has no rales. He exhibits no tenderness and no crepitus.  Appears SOB with talking. Diminished breath sounds.   Abdominal: Soft. Normal appearance and bowel sounds are normal. He exhibits no distension. There is no tenderness. There is no rebound and no guarding.  Musculoskeletal: Normal range of motion. He  exhibits no edema or tenderness.       Back:  Diffuse tenderness of the throracic spine that reproduces his complaint of pain. Moves all extremities well.   Neurological: He is alert and oriented to person, place, and time. He has normal strength. No cranial nerve deficit.  Skin: Skin is warm, dry and intact. No rash noted. No erythema. No pallor.  Psychiatric: He has a normal mood and affect. His speech is normal and behavior is normal. His mood appears not anxious.  Nursing note and vitals reviewed.   ED Course  Procedures   Medications  ketorolac (TORADOL) injection 60 mg (not administered)  albuterol (PROVENTIL) (2.5 MG/3ML) 0.083% nebulizer solution 5 mg (5 mg Nebulization Given 09/16/15 2335)  ipratropium (ATROVENT) nebulizer solution 0.5 mg (0.5 mg Nebulization Given 09/16/15 2335)     DIAGNOSTIC STUDIES: Oxygen Saturation is 95% on RA, adequate by my interpretation.    COORDINATION OF CARE: 11:20 PM Discussed results of chest x-ray and labs. Will order breathing treatment and pain medication. Discussed treatment plan with pt at bedside and pt agreed to plan.  Nurse reports after patient got his nebulizer  treatment and the Toradol for pain that he left. He said he felt better.    Labs Review Results for orders placed or performed during the hospital encounter of [id number]  Basic metabolic panel  Result Value Ref Range   Sodium 134 (L) 135 - 145 mmol/L   Potassium 3.4 (L) 3.5 - 5.1 mmol/L   Chloride 105 101 - 111 mmol/L   CO2 23 22 - 32 mmol/L   Glucose, Bld 182 (H) 65 - 99 mg/dL   BUN 12 6 - 20 mg/dL   Creatinine, Ser 0.60 (L) 0.61 - 1.24 mg/dL   Calcium 8.7 (L) 8.9 - 10.3 mg/dL   GFR calc non Af Amer >60 >60 mL/min   GFR calc Af Amer >60 >60 mL/min   Anion gap 6 5 - 15  CBC  Result Value Ref Range   WBC 7.6 4.0 - 10.5 K/uL   RBC 5.20 4.22 - 5.81 MIL/uL   Hemoglobin 14.3 13.0 - 17.0 g/dL   HCT 44.4 39.0 - 52.0 %   MCV 85.4 78.0 - 100.0 fL   MCH 27.5 26.0 - 34.0 pg   MCHC 32.2 30.0 - 36.0 g/dL   RDW 13.8 11.5 - 15.5 %   Platelets 277 150 - 400 K/uL   Laboratory interpretation all normal except mild hypokalemia and hyponatremia, hyperglycemia     Imaging Review Dg Chest 2 View  09/16/2015  CLINICAL DATA:  Initial evaluation for acute chest pain. EXAM: CHEST  2 VIEW COMPARISON:  Prior study from 07/16/2015. FINDINGS: Mild cardiomegaly is stable. Mediastinal silhouette within normal limits. Lungs are normally inflated. Scattered linear opacities within the mid left lung are stable, consistent with scarring. No focal infiltrate, pulmonary edema, or pleural effusion. No pneumothorax. No acute osseous abnormality. IMPRESSION: 1. No active cardiopulmonary disease. 2. Stable left lung scarring. Electronically Signed   By: Jeannine Boga M.D.   On: 09/16/2015 22:34   I have personally reviewed and evaluated these images and lab results as part of my medical decision-making.  ED ECG REPORT   Date: 09/17/2015  Rate: 73  Rhythm: normal sinus rhythm  QRS Axis: normal  Intervals: normal  ST/T Wave abnormalities: normal  Conduction Disutrbances:none  Narrative Interpretation:    Old EKG Reviewed: none available  I have personally reviewed the EKG tracing and agree with the computerized  printout as noted.   MDM   Final diagnoses:  SOB (shortness of breath)  Chest tightness  Wheezing   Pt left AMA  Rolland Porter, MD, FACEP   I personally performed the services described in this documentation, which was scribed in my presence. The recorded information has been reviewed and considered.  Rolland Porter, MD, Barbette Or, MD 09/17/15 [phone]

## 2015-09-16 NOTE — ED Notes (Signed)
Pt reports CP across chest that started yesterday at rest and has progressively got worse.  Pt reports CP radiates through to his back and SOB as well.  Pt reports Grandson tested positive for flu yesterday.

## 2015-09-17 NOTE — ED Notes (Signed)
After neb tx pt walked out of ED. Conveyed to staff that he felt better and didn't need to stay any longer.   EDP aware.

## 2015-11-04 ENCOUNTER — Encounter (HOSPITAL_COMMUNITY): Payer: Self-pay

## 2015-11-04 ENCOUNTER — Emergency Department (HOSPITAL_COMMUNITY)
Admission: EM | Admit: 2015-11-04 | Discharge: 2015-11-04 | Disposition: A | Discharge status: Home / Self Care | Payer: Medicaid Other | Attending: Emergency Medicine | Admitting: Emergency Medicine

## 2015-11-04 DIAGNOSIS — Z7984 Long term (current) use of oral hypoglycemic drugs: Secondary | ICD-10-CM | POA: Insufficient documentation

## 2015-11-04 DIAGNOSIS — J45909 Unspecified asthma, uncomplicated: Secondary | ICD-10-CM | POA: Insufficient documentation

## 2015-11-04 DIAGNOSIS — L02412 Cutaneous abscess of left axilla: Secondary | ICD-10-CM | POA: Diagnosis not present

## 2015-11-04 DIAGNOSIS — Z87891 Personal history of nicotine dependence: Secondary | ICD-10-CM | POA: Insufficient documentation

## 2015-11-04 DIAGNOSIS — E119 Type 2 diabetes mellitus without complications: Secondary | ICD-10-CM | POA: Insufficient documentation

## 2015-11-04 DIAGNOSIS — Z794 Long term (current) use of insulin: Secondary | ICD-10-CM | POA: Diagnosis not present

## 2015-11-04 DIAGNOSIS — I10 Essential (primary) hypertension: Secondary | ICD-10-CM | POA: Diagnosis not present

## 2015-11-04 MED ORDER — OXYCODONE-ACETAMINOPHEN 5-325 MG PO TABS: 1.0000 | ORAL_TABLET | ORAL | Status: DC | PRN

## 2015-11-04 MED ORDER — DOXYCYCLINE HYCLATE 100 MG PO CAPS: 100.0000 mg | ORAL_CAPSULE | Freq: Two times a day (BID) | ORAL | Status: DC

## 2015-11-04 MED ORDER — DOXYCYCLINE HYCLATE 100 MG PO TABS
100.0000 mg | ORAL_TABLET | Freq: Once | ORAL | Status: AC
  Administered 2015-11-04: 100 mg via ORAL
  Filled 2015-11-04: qty 1

## 2015-11-04 MED ORDER — OXYCODONE-ACETAMINOPHEN 5-325 MG PO TABS
1.0000 | ORAL_TABLET | Freq: Once | ORAL | Status: AC
  Administered 2015-11-04: 1 via ORAL
  Filled 2015-11-04: qty 1

## 2015-11-04 NOTE — ED Provider Notes (Signed)
CSN: [id number]     Arrival date & time 11/04/15  1903 History   First MD Initiated Contact with Patient 11/04/15 1926     Chief Complaint  Patient presents with  . Abscess     (Consider location/radiation/quality/duration/timing/severity/associated sxs/prior Treatment) Patient is a 45 y.o. male presenting with abscess. The history is provided by the patient.  Abscess Associated symptoms: no fever    Shawn Newman is a 45 y.o. male with a history of frequent abscesses presenting with left axillary abscess present for the past week.  He endorses using warm compresses and it has come to soft head but it refused to start draining.  He has not squeezed it out of concern of the infection spreading.  He denies fevers, chills, nausea or vomiting.      Past Medical History  Diagnosis Date  . Hypertension   . Diabetes mellitus   . Abscess   . Chronic back pain   . Sciatica   . Asthma   . Bronchitis   . OSA (obstructive sleep apnea)   . Sleep apnea     uses cpap  . Headache   . Chronic pain    Past Surgical History  Procedure Laterality Date  . No past surgeries     Family History  Problem Relation Age of Onset  . Hypertension Mother   . Diabetes Mother   . Hypertension Father   . Diabetes Father   . Diabetes Brother   . Hypertension Brother   . Colon cancer Sister    Social History  Substance Use Topics  . Smoking status: Former Smoker -- 0.00 packs/day for 24 years    Start date: 06/20/1987    Quit date: 12/18/2011  . Smokeless tobacco: Never Used  . Alcohol Use: No    Review of Systems  Constitutional: Negative for fever and chills.  Respiratory: Negative for shortness of breath and wheezing.   Skin: Positive for wound.  Neurological: Negative for numbness.      Allergies  Sulfa antibiotics  Home Medications   Prior to Admission medications   Medication Sig Start Date End Date Taking? Authorizing Provider  ALPRAZolam Duanne Moron) 1 MG tablet Take 1 mg by  mouth 4 (four) times daily. 01/08/15   Historical Provider, MD  amLODipine (NORVASC) 10 MG tablet Take 10 mg by mouth daily. 08/06/14   Historical Provider, MD  aspirin EC 81 MG tablet Take 81 mg by mouth daily.    Historical Provider, MD  atorvastatin (LIPITOR) 20 MG tablet Take 20 mg by mouth at bedtime. 09/01/14   Historical Provider, MD  budesonide-formoterol (SYMBICORT) 160-4.5 MCG/ACT inhaler Inhale 2 puffs into the lungs daily as needed (for shortness of breath and wheezing).    Historical Provider, MD  buPROPion (WELLBUTRIN XL) 300 MG 24 hr tablet Take 300 mg by mouth every morning. 01/08/15   Historical Provider, MD  carvedilol (COREG) 12.5 MG tablet Take 12.5 mg by mouth 2 (two) times daily with a meal.    Historical Provider, MD  clindamycin (CLEOCIN) 150 MG capsule Take 3 capsules (450 mg total) by mouth 3 (three) times daily. Patient not taking: Reported on 07/16/2015 06/14/15   Evalee Jefferson, PA-C  cyclobenzaprine (FLEXERIL) 10 MG tablet Take 1 tablet (10 mg total) by mouth 2 (two) times daily as needed for muscle spasms. Patient not taking: Reported on 07/16/2015 05/22/15   Ezequiel Essex, MD  doxycycline (VIBRAMYCIN) 100 MG capsule Take 1 capsule (100 mg total) by mouth 2 (two) times  daily. 11/04/15   Evalee Jefferson, PA-C  famotidine (PEPCID) 20 MG tablet take 1 tablet by mouth at bedtime Patient not taking: Reported on 07/16/2015 12/09/14   Tanda Rockers, MD  furosemide (LASIX) 20 MG tablet Take 1 tablet (20 mg total) by mouth daily. 11/02/13   Belkys A Regalado, MD  gabapentin (NEURONTIN) 100 MG capsule take 1 capsule by mouth every 8 hours 07/06/15   Historical Provider, MD  ibuprofen (ADVIL,MOTRIN) 800 MG tablet Take 1 tablet (800 mg total) by mouth 3 (three) times daily. Patient not taking: Reported on 07/16/2015 05/22/15   Ezequiel Essex, MD  insulin aspart (NOVOLOG) 100 UNIT/ML injection Inject 10 Units into the skin 3 (three) times daily with meals. 11/02/13   Belkys A Regalado, MD  LANTUS  SOLOSTAR 100 UNIT/ML Solostar Pen Inject 30 Units into the skin at bedtime. 08/28/14   Historical Provider, MD  latanoprost (XALATAN) 0.005 % ophthalmic solution Place 1 drop into both eyes at bedtime. 08/21/14   Historical Provider, MD  metFORMIN (GLUCOPHAGE) 1000 MG tablet Take 1,000 mg by mouth 2 (two) times daily with a meal.      Historical Provider, MD  omeprazole (PRILOSEC) 40 MG capsule Take 40 mg by mouth daily. 01/14/15   Historical Provider, MD  oxyCODONE-acetaminophen (PERCOCET/ROXICET) 5-325 MG tablet Take 1 tablet by mouth every 4 (four) hours as needed. 11/04/15   Evalee Jefferson, PA-C  pantoprazole (PROTONIX) 40 MG tablet take 1 tablet by mouth 30 TO 60 [place] DAY Patient not taking: Reported on 07/16/2015 11/25/14   Tanda Rockers, MD  VYVANSE 70 MG capsule Take 70 mg by mouth every morning.  07/03/15   Historical Provider, MD   BP 142/83 mmHg  Pulse 77  Temp(Src) 97.5 F (36.4 C) (Oral)  Resp 20  Ht 5\' 11"  (1.803 m)  Wt 177.356 kg  BMI 54.56 kg/m2  SpO2 95% Physical Exam  Constitutional: He is oriented to person, place, and time. He appears well-developed and well-nourished.  HENT:  Head: Normocephalic.  Cardiovascular: Normal rate.   Pulmonary/Chest: Effort normal.  Musculoskeletal: He exhibits tenderness.  Neurological: He is alert and oriented to person, place, and time. No sensory deficit.  Skin:  2 cm induration left axilla with central fluctuant pustule.  No drainage.  No red streaking. Trace surrounding erythema.    ED Course  Procedures (including critical care time)  Apiration of blood/fluid Performed by: Evalee Jefferson Consent obtained. Required items: required blood products, implants, devices, and special equipment available Patient identity confirmed: verbally with patient Time out: Immediately prior to procedure a "time out" was called to verify the correct patient, procedure, equipment, support staff and site/side marked as  required. Preparation: Patient was prepped and draped in the usual sterile fashion. Patient tolerance: Patient tolerated the procedure well with no immediate complications.  Location of aspiration: left axilla.  [place] Pain Ease used for local pain control after skin was prepped using alcohol swabs. Needle aspiration using #18 gauge needle with 2 cc of purulence obtained.  Patient refused full I & D   Labs Review Labs Reviewed - No data to display  Imaging Review No results found. I have personally reviewed and evaluated these images and lab results as part of my medical decision-making.   EKG Interpretation None      MDM   Final diagnoses:  Abscess of axilla, left    He was prescribed doxycycline.  Oxycodone.  Advised warm compresses to facilitate continued drainage.  Plan when necessary follow-up with PCP if symptoms persist or worsen.    Evalee Jefferson, PA-C 11/04/15 2015  Francine Graven, DO 11/04/15 2033

## 2015-11-04 NOTE — ED Notes (Signed)
Pt reports that he has a boil under his left arm. Reports pain radiates into arm left and back. No drainage at this time

## 2015-11-04 NOTE — Discharge Instructions (Signed)
Abscess An abscess is an infected area that contains a collection of pus and debris.It can occur in almost any part of the body. An abscess is also known as a furuncle or boil. CAUSES  An abscess occurs when tissue gets infected. This can occur from blockage of oil or sweat glands, infection of hair follicles, or a minor injury to the skin. As the body tries to fight the infection, pus collects in the area and creates pressure under the skin. This pressure causes pain. People with weakened immune systems have difficulty fighting infections and get certain abscesses more often.  SYMPTOMS Usually an abscess develops on the skin and becomes a painful mass that is red, warm, and tender. If the abscess forms under the skin, you may feel a moveable soft area under the skin. Some abscesses break open (rupture) on their own, but most will continue to get worse without care. The infection can spread deeper into the body and eventually into the bloodstream, causing you to feel ill.  DIAGNOSIS  Your caregiver will take your medical history and perform a physical exam. A sample of fluid may also be taken from the abscess to determine what is causing your infection. TREATMENT  Your caregiver may prescribe antibiotic medicines to fight the infection. However, taking antibiotics alone usually does not cure an abscess. Your caregiver may need to make a small cut (incision) in the abscess to drain the pus. In some cases, gauze is packed into the abscess to reduce pain and to continue draining the area. HOME CARE INSTRUCTIONS   Only take over-the-counter or prescription medicines for pain, discomfort, or fever as directed by your caregiver.  If you were prescribed antibiotics, take them as directed. Finish them even if you start to feel better.  If gauze is used, follow your caregiver's directions for changing the gauze.  To avoid spreading the infection:  Keep your draining abscess covered with a  bandage.  Wash your hands well.  Do not share personal care items, towels, or whirlpools with others.  Avoid skin contact with others.  Keep your skin and clothes clean around the abscess.  Keep all follow-up appointments as directed by your caregiver. SEEK MEDICAL CARE IF:   You have increased pain, swelling, redness, fluid drainage, or bleeding.  You have muscle aches, chills, or a general ill feeling.  You have a fever. MAKE SURE YOU:   Understand these instructions.  Will watch your condition.  Will get help right away if you are not doing well or get worse.   This information is not intended to replace advice given to you by your health care provider. Make sure you discuss any questions you have with your health care provider.   Document Released: 03/15/2005 Document Revised: 12/05/2011 Document Reviewed: 08/18/2011 Elsevier Interactive Patient Education 2016 [place] warm soaks as you are currently doing. You may gently massage the site but avoid any deep squeezing as this can drive the infection deeper.  Return here or see your doctor to help department if you had any worsening symptoms.  Make sure you take the entire course of antibiotics prescribed. You may take the oxycodone prescribed for pain relief.  This will make you drowsy - do not drive within 4 hours of taking this medication.

## 2015-11-12 ENCOUNTER — Emergency Department (HOSPITAL_COMMUNITY): Payer: Medicaid Other

## 2015-11-12 ENCOUNTER — Emergency Department (HOSPITAL_COMMUNITY)
Admission: EM | Admit: 2015-11-12 | Discharge: 2015-11-13 | Disposition: A | Discharge status: Home / Self Care | Payer: Medicaid Other | Attending: Emergency Medicine | Admitting: Emergency Medicine

## 2015-11-12 ENCOUNTER — Encounter (HOSPITAL_COMMUNITY): Payer: Self-pay | Admitting: *Deleted

## 2015-11-12 DIAGNOSIS — Z87891 Personal history of nicotine dependence: Secondary | ICD-10-CM | POA: Insufficient documentation

## 2015-11-12 DIAGNOSIS — Z794 Long term (current) use of insulin: Secondary | ICD-10-CM | POA: Insufficient documentation

## 2015-11-12 DIAGNOSIS — K5900 Constipation, unspecified: Secondary | ICD-10-CM | POA: Insufficient documentation

## 2015-11-12 DIAGNOSIS — Z7984 Long term (current) use of oral hypoglycemic drugs: Secondary | ICD-10-CM | POA: Diagnosis not present

## 2015-11-12 DIAGNOSIS — H538 Other visual disturbances: Secondary | ICD-10-CM | POA: Diagnosis not present

## 2015-11-12 DIAGNOSIS — M549 Dorsalgia, unspecified: Secondary | ICD-10-CM | POA: Diagnosis not present

## 2015-11-12 DIAGNOSIS — Z7982 Long term (current) use of aspirin: Secondary | ICD-10-CM | POA: Insufficient documentation

## 2015-11-12 DIAGNOSIS — Z79899 Other long term (current) drug therapy: Secondary | ICD-10-CM | POA: Insufficient documentation

## 2015-11-12 DIAGNOSIS — D3502 Benign neoplasm of left adrenal gland: Secondary | ICD-10-CM | POA: Insufficient documentation

## 2015-11-12 DIAGNOSIS — R1084 Generalized abdominal pain: Secondary | ICD-10-CM | POA: Diagnosis present

## 2015-11-12 DIAGNOSIS — I10 Essential (primary) hypertension: Secondary | ICD-10-CM | POA: Diagnosis not present

## 2015-11-12 DIAGNOSIS — G8929 Other chronic pain: Secondary | ICD-10-CM | POA: Insufficient documentation

## 2015-11-12 DIAGNOSIS — E119 Type 2 diabetes mellitus without complications: Secondary | ICD-10-CM | POA: Insufficient documentation

## 2015-11-12 DIAGNOSIS — J45909 Unspecified asthma, uncomplicated: Secondary | ICD-10-CM | POA: Diagnosis not present

## 2015-11-12 LAB — COMPREHENSIVE METABOLIC PANEL
ALT: 26 U/L (ref 17–63)
AST: 21 U/L (ref 15–41)
Albumin: 3.9 g/dL (ref 3.5–5.0)
Alkaline Phosphatase: 51 U/L (ref 38–126)
Anion gap: 8 (ref 5–15)
BUN: 12 mg/dL (ref 6–20)
CHLORIDE: 107 mmol/L (ref 101–111)
CO2: 24 mmol/L (ref 22–32)
Calcium: 8.6 mg/dL — ABNORMAL LOW (ref 8.9–10.3)
Creatinine, Ser: 0.6 mg/dL — ABNORMAL LOW (ref 0.61–1.24)
GFR calc Af Amer: >60 mL/min (ref >60)
Glucose, Bld: 195 mg/dL — ABNORMAL HIGH (ref 65–99)
POTASSIUM: 3.3 mmol/L — AB (ref 3.5–5.1)
SODIUM: 139 mmol/L (ref 135–145)
Total Bilirubin: 0.6 mg/dL (ref 0.3–1.2)
Total Protein: 7.9 g/dL (ref 6.5–8.1)

## 2015-11-12 LAB — CBC WITH DIFFERENTIAL/PLATELET
BASOS ABS: 0 10*3/uL (ref 0.0–0.1)
BASOS PCT: 0 %
Eosinophils Absolute: 0.2 10*3/uL (ref 0.0–0.7)
Eosinophils Relative: 2 %
HEMATOCRIT: 43.2 % (ref 39.0–52.0)
HEMOGLOBIN: 14 g/dL (ref 13.0–17.0)
LYMPHS PCT: 44 %
Lymphs Abs: 4 10*3/uL (ref 0.7–4.0)
MCH: 27.9 pg (ref 26.0–34.0)
MCHC: 32.4 g/dL (ref 30.0–36.0)
MCV: 86.2 fL (ref 78.0–100.0)
MONO ABS: 0.6 10*3/uL (ref 0.1–1.0)
MONOS PCT: 7 %
NEUTROS ABS: 4.3 10*3/uL (ref 1.7–7.7)
NEUTROS PCT: 47 %
Platelets: 286 10*3/uL (ref 150–400)
RBC: 5.01 MIL/uL (ref 4.22–5.81)
RDW: 13.8 % (ref 11.5–15.5)
WBC: 9 10*3/uL (ref 4.0–10.5)

## 2015-11-12 LAB — LIPASE, BLOOD: Lipase: 22 U/L (ref 11–51)

## 2015-11-12 MED ORDER — ONDANSETRON HCL 4 MG/2ML IJ SOLN
4.0000 mg | Freq: Once | INTRAMUSCULAR | Status: AC
  Administered 2015-11-12: 4 mg via INTRAVENOUS
  Filled 2015-11-12: qty 2

## 2015-11-12 MED ORDER — IOPAMIDOL (ISOVUE-300) INJECTION 61%
100.0000 mL | Freq: Once | INTRAVENOUS | Status: AC | PRN
  Administered 2015-11-13: 100 mL via INTRAVENOUS

## 2015-11-12 MED ORDER — SODIUM CHLORIDE 0.9 % IV SOLN: INTRAVENOUS | Status: DC

## 2015-11-12 MED ORDER — POLYETHYLENE GLYCOL 3350 17 G PO PACK: 17.0000 g | PACK | Freq: Every day | ORAL | Status: DC

## 2015-11-12 MED ORDER — SODIUM CHLORIDE 0.9 % IV BOLUS (SEPSIS)
500.0000 mL | Freq: Once | INTRAVENOUS | Status: AC
  Administered 2015-11-12: 500 mL via INTRAVENOUS

## 2015-11-12 NOTE — ED Notes (Signed)
Pt c/o abdominal pain and bloating x 2 weeks. Pt states the pain is worse after he eats.

## 2015-11-12 NOTE — ED Provider Notes (Addendum)
CSN: [id number]     Arrival date & time 11/12/15  2106 History  By signing my name below, I, [facility], attest that this documentation has been prepared under the direction and in the presence of Shawn Sorrow, MD. Electronically Signed: Virgel Bouquet, ED Scribe. 11/12/2015. 10:31 PM.   Chief Complaint  Patient presents with  . Abdominal Pain    The history is provided by the patient. No language interpreter was used.  HPI Comments: Shawn Newman is a 45 y.o. male with an hx of HTN, DM, chronic back pain, sciatica on home O2 who presents to the Emergency Department complaining of intermittent, 9/10 generalized abdominal pain onset 2 weeks ago. Pt states that the pain intermittently radiates to his lower back. He reports constipation with hard, small BMs, abdominal distension, and increaed belching and flatulence. He has taken Gas-X without relief. He notes similar abdominal pain in the past 2 months but states that current symptoms last longer than they have in the past. Per pt, he recently was diagnosed with conjunctivitis and a URI which have both improved with treatment. Denies nausea, vomiting, diarrhea, HA, fevers, and chills.  Past Medical History  Diagnosis Date  . Hypertension   . Diabetes mellitus   . Abscess   . Chronic back pain   . Sciatica   . Asthma   . Bronchitis   . OSA (obstructive sleep apnea)   . Sleep apnea     uses cpap  . Headache   . Chronic pain    Past Surgical History  Procedure Laterality Date  . No past surgeries     Family History  Problem Relation Age of Onset  . Hypertension Mother   . Diabetes Mother   . Hypertension Father   . Diabetes Father   . Diabetes Brother   . Hypertension Brother   . Colon cancer Sister    Social History  Substance Use Topics  . Smoking status: Former Smoker -- 0.00 packs/day for 24 years    Start date: 06/20/1987    Quit date: 12/18/2011  . Smokeless tobacco: Never Used  . Alcohol Use: No     Review of Systems  Constitutional: Negative for fever and chills.  HENT: Negative for rhinorrhea and sore throat.   Eyes: Positive for visual disturbance (blurred vision).  Respiratory: Negative for cough and shortness of breath.   Cardiovascular: Negative for chest pain and leg swelling.  Gastrointestinal: Positive for abdominal pain, constipation and abdominal distention. Negative for nausea, vomiting and diarrhea.  Genitourinary: Negative for dysuria and hematuria.  Musculoskeletal: Positive for back pain.  Skin: Negative for rash.  Neurological: Negative for headaches.  Hematological: Negative for adenopathy. Does not bruise/bleed easily.  Psychiatric/Behavioral: Negative for confusion.      Allergies  Sulfa antibiotics  Home Medications   Prior to Admission medications   Medication Sig Start Date End Date Taking? Authorizing Provider  ALPRAZolam Duanne Moron) 1 MG tablet Take 1 mg by mouth 4 (four) times daily. 01/08/15  Yes Historical Provider, MD  amLODipine (NORVASC) 10 MG tablet Take 10 mg by mouth daily. 08/06/14  Yes Historical Provider, MD  aspirin EC 81 MG tablet Take 81 mg by mouth daily.   Yes Historical Provider, MD  atorvastatin (LIPITOR) 20 MG tablet Take 20 mg by mouth at bedtime. 09/01/14  Yes Historical Provider, MD  budesonide-formoterol (SYMBICORT) 160-4.5 MCG/ACT inhaler Inhale 2 puffs into the lungs daily as needed (for shortness of breath and wheezing).   Yes Historical Provider, MD  buPROPion (WELLBUTRIN XL) 300 MG 24 hr tablet Take 300 mg by mouth every morning. 01/08/15  Yes Historical Provider, MD  carvedilol (COREG) 12.5 MG tablet Take 12.5 mg by mouth 2 (two) times daily with a meal.   Yes Historical Provider, MD  doxycycline (VIBRAMYCIN) 100 MG capsule Take 1 capsule (100 mg total) by mouth 2 (two) times daily. 11/04/15  Yes Almyra Free Idol, PA-C  furosemide (LASIX) 20 MG tablet Take 1 tablet (20 mg total) by mouth daily. 11/02/13  Yes Belkys A Regalado, MD   gabapentin (NEURONTIN) 100 MG capsule take 1 capsule by mouth every 8 hours 07/06/15  Yes Historical Provider, MD  insulin aspart (NOVOLOG) 100 UNIT/ML injection Inject 10 Units into the skin 3 (three) times daily with meals. 11/02/13  Yes Belkys A Regalado, MD  LANTUS SOLOSTAR 100 UNIT/ML Solostar Pen Inject 15 Units into the skin at bedtime.  08/28/14  Yes Historical Provider, MD  latanoprost (XALATAN) 0.005 % ophthalmic solution Place 1 drop into both eyes at bedtime. 08/21/14  Yes Historical Provider, MD  metFORMIN (GLUCOPHAGE) 1000 MG tablet Take 1,000 mg by mouth 2 (two) times daily with a meal.     Yes Historical Provider, MD  omeprazole (PRILOSEC) 40 MG capsule Take 40 mg by mouth daily. 01/14/15  Yes Historical Provider, MD  trimethoprim-polymyxin b (POLYTRIM) ophthalmic solution Place 1 drop into the right eye every 3 (three) hours. For pink eye 11/08/15  Yes Historical Provider, MD  VYVANSE 70 MG capsule Take 70 mg by mouth every morning.  07/03/15  Yes Historical Provider, MD  HYDROcodone-acetaminophen [facility]) 10-325 MG tablet Reported on 11/12/2015 10/22/15   Historical Provider, MD  oxyCODONE-acetaminophen (PERCOCET/ROXICET) 5-325 MG tablet Take 1 tablet by mouth every 4 (four) hours as needed. Patient not taking: Reported on 11/12/2015 11/04/15   Evalee Jefferson, PA-C  polyethylene glycol (MIRALAX / GLYCOLAX) packet Take 17 g by mouth daily. 11/12/15   Shawn Sorrow, MD   BP 128/77 mmHg  Pulse 88  Temp(Src) 97.9 F (36.6 C) (Oral)  Resp 20  Ht 5\' 11"  (1.803 m)  Wt 177.356 kg  BMI 54.56 kg/m2  SpO2 94% Physical Exam  Constitutional: He is oriented to person, place, and time. He appears well-developed and well-nourished. No distress.  HENT:  Head: Normocephalic and atraumatic.  Mouth/Throat: Mucous membranes are normal.  Eyes: Conjunctivae and EOM are normal. Pupils are equal, round, and reactive to light.  Scleras a little bit red.  Neck: Normal range of motion.  Cardiovascular: Normal  rate and regular rhythm.   Pulmonary/Chest: Effort normal and breath sounds normal. No respiratory distress.  Lungs CTA bilaterally.  Abdominal: Bowel sounds are normal. He exhibits distension. There is no tenderness.  Musculoskeletal: Normal range of motion.  No ankle swelling bilaterally.  Neurological: He is alert and oriented to person, place, and time.  Skin: Skin is warm and dry.  Psychiatric: He has a normal mood and affect. His behavior is normal.  Nursing note and vitals reviewed.   ED Course  Procedures (including critical care time)  DIAGNOSTIC STUDIES: Oxygen Saturation is 94% on RA, adequate by my interpretation.    COORDINATION OF CARE: 10:22 PM Will order Zofran, IV contrast, and abdominal CT scan. Discussed treatment plan with pt at bedside and pt agreed to plan.   Labs Review Labs Reviewed  COMPREHENSIVE METABOLIC PANEL - Abnormal; Notable for the following:    Potassium 3.3 (*)    Glucose, Bld 195 (*)    Creatinine, Ser 0.60 (*)  Calcium 8.6 (*)    All other components within normal limits  LIPASE, BLOOD  CBC WITH DIFFERENTIAL/PLATELET    Imaging Review No results found. I have personally reviewed and evaluated these images and lab results as part of my medical decision-making.   EKG Interpretation None      MDM   Final diagnoses:  Generalized abdominal pain  Constipation, unspecified constipation type    Patient with 2 week history of generalized abdominal pain and bloating. Sharp pains at times most of time throughout the day will sometimes go away for 30 minutes. Associated with a feeling of constipation. No nausea vomiting and certainly no diarrhea. Pain does radiate to the back.  CT scan ordered to rule out any acute abdominal process. If it is related to constipation would recommend Mira lax.  Patient seems to be nontoxic no acute distress disposition will be based on CT scan and lab findings.  Patient will be turned over to the  overnight ED physician Dr. Roxanne Mins for disposition.    I personally performed the services described in this documentation, which was scribed in my presence. The recorded information has been reviewed and is accurate.      Shawn Sorrow, MD 11/12/15 [id number]  Shawn Sorrow, MD 11/14/15 [phone]

## 2015-11-13 NOTE — ED Provider Notes (Signed)
Patient's care signed out to follow-up CT scan results for final disposition. CT scan results unremarkable. Patient does have adenoma adrenal noted by radiology. Discussed this with the patient and patient will require outpatient follow-up for this.  Ct Abdomen Pelvis W Contrast  11/13/2015  CLINICAL DATA:  Subacute onset of generalized abdominal pain and bloating. Initial encounter. EXAM: CT ABDOMEN AND PELVIS WITH CONTRAST TECHNIQUE: Multidetector CT imaging of the abdomen and pelvis was performed using the standard protocol following bolus administration of intravenous contrast. CONTRAST:  112mL ISOVUE-300 IOPAMIDOL (ISOVUE-300) INJECTION 61% COMPARISON:  CT of the abdomen and pelvis from 06/19/2012 FINDINGS: The visualized lung bases are clear. Mild coronary artery calcification is seen. The liver and spleen are unremarkable in appearance. The gallbladder is within normal limits. The pancreas and right adrenal gland are unremarkable. A 4.8 cm left adrenal adenoma is noted. The kidneys are unremarkable in appearance. There is no evidence of hydronephrosis. No renal or ureteral stones are seen. No perinephric stranding is appreciated. No free fluid is identified. The small bowel is unremarkable in appearance. The stomach is within normal limits. No acute vascular abnormalities are seen. Minimal calcification is seen at the distal abdominal aorta and its branches. The appendix is normal in caliber, without evidence of appendicitis. The colon is grossly unremarkable in appearance. The bladder is mildly distended and grossly unremarkable. The prostate remains normal in size. No inguinal lymphadenopathy is seen. No acute osseous abnormalities are identified. IMPRESSION: 1. No acute abnormality seen within the abdomen or pelvis. 2. 4.8 cm left adrenal adenoma noted. 3. Mild coronary artery calcification seen. Electronically Signed   By: Garald Balding M.D.   On: 11/13/2015 00:55   Generalized abdominal  pain  Constipation, unspecified constipation type  Adrenal adenoma, left    Elnora Morrison, MD 11/13/15 [phone]

## 2015-11-13 NOTE — Discharge Instructions (Signed)
Take Miralax as needed for constipation. Return for any new or worse symptoms. See your doctor for your adrenal adenoma and further work up for it.

## 2016-01-04 ENCOUNTER — Emergency Department (HOSPITAL_COMMUNITY)
Admission: EM | Admit: 2016-01-04 | Discharge: 2016-01-04 | Disposition: A | Discharge status: Home / Self Care | Payer: Medicaid Other | Attending: Emergency Medicine | Admitting: Emergency Medicine

## 2016-01-04 ENCOUNTER — Emergency Department (HOSPITAL_COMMUNITY): Payer: Medicaid Other

## 2016-01-04 ENCOUNTER — Encounter (HOSPITAL_COMMUNITY): Payer: Self-pay | Admitting: Nurse Practitioner

## 2016-01-04 DIAGNOSIS — S299XXA Unspecified injury of thorax, initial encounter: Secondary | ICD-10-CM | POA: Diagnosis present

## 2016-01-04 DIAGNOSIS — Z87891 Personal history of nicotine dependence: Secondary | ICD-10-CM | POA: Diagnosis not present

## 2016-01-04 DIAGNOSIS — I10 Essential (primary) hypertension: Secondary | ICD-10-CM | POA: Insufficient documentation

## 2016-01-04 DIAGNOSIS — Y9301 Activity, walking, marching and hiking: Secondary | ICD-10-CM | POA: Diagnosis not present

## 2016-01-04 DIAGNOSIS — L989 Disorder of the skin and subcutaneous tissue, unspecified: Secondary | ICD-10-CM

## 2016-01-04 DIAGNOSIS — J45909 Unspecified asthma, uncomplicated: Secondary | ICD-10-CM | POA: Insufficient documentation

## 2016-01-04 DIAGNOSIS — Z794 Long term (current) use of insulin: Secondary | ICD-10-CM | POA: Diagnosis not present

## 2016-01-04 DIAGNOSIS — Z7984 Long term (current) use of oral hypoglycemic drugs: Secondary | ICD-10-CM | POA: Diagnosis not present

## 2016-01-04 DIAGNOSIS — Z7982 Long term (current) use of aspirin: Secondary | ICD-10-CM | POA: Diagnosis not present

## 2016-01-04 DIAGNOSIS — Y999 Unspecified external cause status: Secondary | ICD-10-CM | POA: Diagnosis not present

## 2016-01-04 DIAGNOSIS — B372 Candidiasis of skin and nail: Secondary | ICD-10-CM

## 2016-01-04 DIAGNOSIS — S233XXA Sprain of ligaments of thoracic spine, initial encounter: Secondary | ICD-10-CM

## 2016-01-04 DIAGNOSIS — E119 Type 2 diabetes mellitus without complications: Secondary | ICD-10-CM | POA: Diagnosis not present

## 2016-01-04 DIAGNOSIS — Z79899 Other long term (current) drug therapy: Secondary | ICD-10-CM | POA: Diagnosis not present

## 2016-01-04 DIAGNOSIS — W109XXA Fall (on) (from) unspecified stairs and steps, initial encounter: Secondary | ICD-10-CM | POA: Insufficient documentation

## 2016-01-04 DIAGNOSIS — S29012A Strain of muscle and tendon of back wall of thorax, initial encounter: Secondary | ICD-10-CM

## 2016-01-04 DIAGNOSIS — Y929 Unspecified place or not applicable: Secondary | ICD-10-CM | POA: Insufficient documentation

## 2016-01-04 MED ORDER — CYCLOBENZAPRINE HCL 10 MG PO TABS: 10.0000 mg | ORAL_TABLET | Freq: Two times a day (BID) | ORAL | Status: DC | PRN

## 2016-01-04 MED ORDER — NYSTATIN PO POWD: ORAL | Status: DC

## 2016-01-04 MED ORDER — CYCLOBENZAPRINE HCL 10 MG PO TABS
10.0000 mg | ORAL_TABLET | Freq: Once | ORAL | Status: AC
  Administered 2016-01-04: 10 mg via ORAL
  Filled 2016-01-04: qty 1

## 2016-01-04 NOTE — ED Provider Notes (Signed)
CSN: [id number]     Arrival date & time 01/04/16  1603 History  By signing my name below, I, Shawn Newman, attest that this documentation has been prepared under the direction and in the presence of [name], NP Electronically Signed: [place], ED Scribe. 01/04/2016. 5:40 PM.   Chief Complaint  Patient presents with  . Fall      Patient is a 45 y.o. male presenting with fall. The history is provided by the patient. No language interpreter was used.  Fall This is a new problem. The current episode started yesterday. The problem occurs rarely. The problem has not changed since onset.Pertinent negatives include no abdominal pain. Nothing relieves the symptoms. He has tried a warm compress and a cold compress for the symptoms. The treatment provided no relief.    Shawn Newman is a 45 y.o. male with a medical hx of HTN, DM, chronic back pain, who presents to the Emergency Department complaining of fall onset yesterday. He notes that he slipped and fell onto his mid-upper back while going down steps, when his left foot gave out on him. Pt states that his chronic back pain is typically his lower back. Pt is having associated symptoms of 9/10 mid-upper back pain. He notes that he has tried tylenol and alternating heat and ice with no relief of his symptoms. Pt states that he typically takes hydrocodone for his lower back pain, but he declines taking this medication due to being out. Pt denies having a PCP or orthopedist, but notes that he received the hydrocodone from his wife's Rx that she gives him for his chronic lower back pain. He denies hitting his head, LOC, bowel/bladder incontinence, and any other symptoms.   Pt secondarily complains of a mole under his right breast x 5 months. He notes that the mole continues to fall off and regrow, pt voices that he would like it removed today. Pt states that he would like the mole removed due to the smell from the area. Pt notes that he has had biopsy  completed to the moles to his neck in the past. Pt has not tried any medications for his symptoms. Pt denies any other symptoms.   Past Medical History  Diagnosis Date  . Hypertension   . Diabetes mellitus   . Abscess   . Chronic back pain   . Sciatica   . Asthma   . Bronchitis   . OSA (obstructive sleep apnea)   . Sleep apnea     uses cpap  . Headache   . Chronic pain    Past Surgical History  Procedure Laterality Date  . No past surgeries     Family History  Problem Relation Age of Onset  . Hypertension Mother   . Diabetes Mother   . Hypertension Father   . Diabetes Father   . Diabetes Brother   . Hypertension Brother   . Colon cancer Sister    Social History  Substance Use Topics  . Smoking status: Former Smoker -- 0.00 packs/day for 24 years    Start date: 06/20/1987    Quit date: 12/18/2011  . Smokeless tobacco: Never Used  . Alcohol Use: No    Review of Systems  Gastrointestinal: Negative for abdominal pain.       No bowel incontinence.  Genitourinary:       No bladder incontinence.  Musculoskeletal: Positive for back pain (mid and upper).  Skin: Positive for color change and rash.  Mole under right breast.  Neurological: Negative for syncope.      Allergies  Sulfa antibiotics  Home Medications   Prior to Admission medications   Medication Sig Start Date End Date Taking? Authorizing Provider  ALPRAZolam Duanne Moron) 1 MG tablet Take 1 mg by mouth 4 (four) times daily. 01/08/15   Historical Provider, MD  amLODipine (NORVASC) 10 MG tablet Take 10 mg by mouth daily. 08/06/14   Historical Provider, MD  aspirin EC 81 MG tablet Take 81 mg by mouth daily.    Historical Provider, MD  atorvastatin (LIPITOR) 20 MG tablet Take 20 mg by mouth at bedtime. 09/01/14   Historical Provider, MD  budesonide-formoterol (SYMBICORT) 160-4.5 MCG/ACT inhaler Inhale 2 puffs into the lungs daily as needed (for shortness of breath and wheezing).    Historical Provider, MD   buPROPion (WELLBUTRIN XL) 300 MG 24 hr tablet Take 300 mg by mouth every morning. 01/08/15   Historical Provider, MD  carvedilol (COREG) 12.5 MG tablet Take 12.5 mg by mouth 2 (two) times daily with a meal.    Historical Provider, MD  cyclobenzaprine (FLEXERIL) 10 MG tablet Take 1 tablet (10 mg total) by mouth 2 (two) times daily as needed for muscle spasms. 01/04/16   Reba Hulett Bunnie Pion, NP  doxycycline (VIBRAMYCIN) 100 MG capsule Take 1 capsule (100 mg total) by mouth 2 (two) times daily. 11/04/15   Evalee Jefferson, PA-C  furosemide (LASIX) 20 MG tablet Take 1 tablet (20 mg total) by mouth daily. 11/02/13   Belkys A Regalado, MD  gabapentin (NEURONTIN) 100 MG capsule take 1 capsule by mouth every 8 hours 07/06/15   Historical Provider, MD  HYDROcodone-acetaminophen [facility]) 10-325 MG tablet Reported on 11/12/2015 10/22/15   Historical Provider, MD  insulin aspart (NOVOLOG) 100 UNIT/ML injection Inject 10 Units into the skin 3 (three) times daily with meals. 11/02/13   Belkys A Regalado, MD  LANTUS SOLOSTAR 100 UNIT/ML Solostar Pen Inject 15 Units into the skin at bedtime.  08/28/14   Historical Provider, MD  latanoprost (XALATAN) 0.005 % ophthalmic solution Place 1 drop into both eyes at bedtime. 08/21/14   Historical Provider, MD  metFORMIN (GLUCOPHAGE) 1000 MG tablet Take 1,000 mg by mouth 2 (two) times daily with a meal.      Historical Provider, MD  Nystatin POWD Apply to affected area BID 01/04/16   [place], NP  omeprazole (PRILOSEC) 40 MG capsule Take 40 mg by mouth daily. 01/14/15   Historical Provider, MD  oxyCODONE-acetaminophen (PERCOCET/ROXICET) 5-325 MG tablet Take 1 tablet by mouth every 4 (four) hours as needed. Patient not taking: Reported on 11/12/2015 11/04/15   Evalee Jefferson, PA-C  polyethylene glycol (MIRALAX / GLYCOLAX) packet Take 17 g by mouth daily. 11/12/15   [name], MD  trimethoprim-polymyxin b (POLYTRIM) ophthalmic solution Place 1 drop into the right eye every 3 (three) hours. For pink  eye 11/08/15   Historical Provider, MD  VYVANSE 70 MG capsule Take 70 mg by mouth every morning.  07/03/15   Historical Provider, MD   BP 127/81 mmHg  Pulse 80  Temp(Src) 97.9 F (36.6 C) (Oral)  Resp 18  SpO2 97% Physical Exam  Constitutional: He is oriented to person, place, and time. He appears well-developed and well-nourished. No distress.  HENT:  Head: Normocephalic and atraumatic.  Eyes: EOM are normal.  Neck: Normal range of motion. Neck supple.  Cardiovascular: Normal rate, regular rhythm and normal heart sounds.  Exam reveals no gallop and no friction rub.  No murmur heard. Pulmonary/Chest: Effort normal and breath sounds normal. No respiratory distress. He has no wheezes. He has no rales.  Abdominal: Soft. He exhibits no distension. There is no tenderness.  Musculoskeletal: Normal range of motion.  No cervical, thoracic, or lumbar spinal tenderness. Muscle spasms to the bilateral thoracic area.   Neurological: He is alert and oriented to person, place, and time.  Skin: Skin is warm and dry.  Wetness under the skin folds of the breasts that is malodorous consistent with manilia.   Psychiatric: He has a normal mood and affect. His behavior is normal.  Nursing note and vitals reviewed.   ED Course  Procedures (including critical care time) DIAGNOSTIC STUDIES: Oxygen Saturation is 95% on RA, adequate by my interpretation.    COORDINATION OF CARE: 5:36 PM Discussed treatment plan with pt at bedside which includes thoracic spine xray, flexeril, nystatin, and referral to dermatologist, and pt agreed to plan.    Imaging Review No results found. I have personally reviewed and evaluated these images as part of my medical decision-making.    MDM   Final diagnoses:  Thoracic sprain and strain, initial encounter  Monilial rash  Skin lesion    Patient with back pain.  No neurological deficits and normal neuro exam.  Patient is ambulatory.  No loss of bowel or bladder  control.  No concern for cauda equina.  No fever, night sweats, weight loss, h/o cancer, IVDA, no recent procedure to back. No urinary symptoms suggestive of UTI.    Pt also presents with several small moles, that the pt is concerned about. I do not think that the moles are infected, but I recommend that he be evaluated by a dermatologist. Supportive care and return precaution discussed. Appears safe for discharge at this time. Follow up as indicated in discharge paperwork.   Patient also with monilia under the fold of right breast, will treat with Nystatin powder. Patient to f/u with PCP  I personally performed the services described in this documentation, which was scribed in my presence. The recorded information has been reviewed and is accurate.    [address] [place], [place] 01/07/16 [place], MD 01/08/16 [phone]

## 2016-01-04 NOTE — ED Notes (Signed)
Patient transported to X-ray 

## 2016-01-04 NOTE — Discharge Instructions (Signed)
Follow up with the dermatologist for your moles. Use the powder for the areas that are wet and smell. Follow up with your doctor for your general health care and your back problems.

## 2016-01-04 NOTE — ED Notes (Signed)
Pt c/o mid back pain since he slipped on wet floor and fell onto back yesterday. HE applied heat and ice but pain persists. Reports hx chronic back pain. He also c/o mole under R breast fold that continues to fall off and regrow, wants it removed. he is ambulatory, mae.

## 2016-03-28 IMAGING — DX DG LUMBAR SPINE COMPLETE 4+V
5 series · 5 of 5 positions shown · non-contrast
Comparison: 01/14/2013

CLINICAL DATA: Fall backwards

EXAM:
LUMBAR SPINE - COMPLETE 4+ VIEW

[l-spine ap]
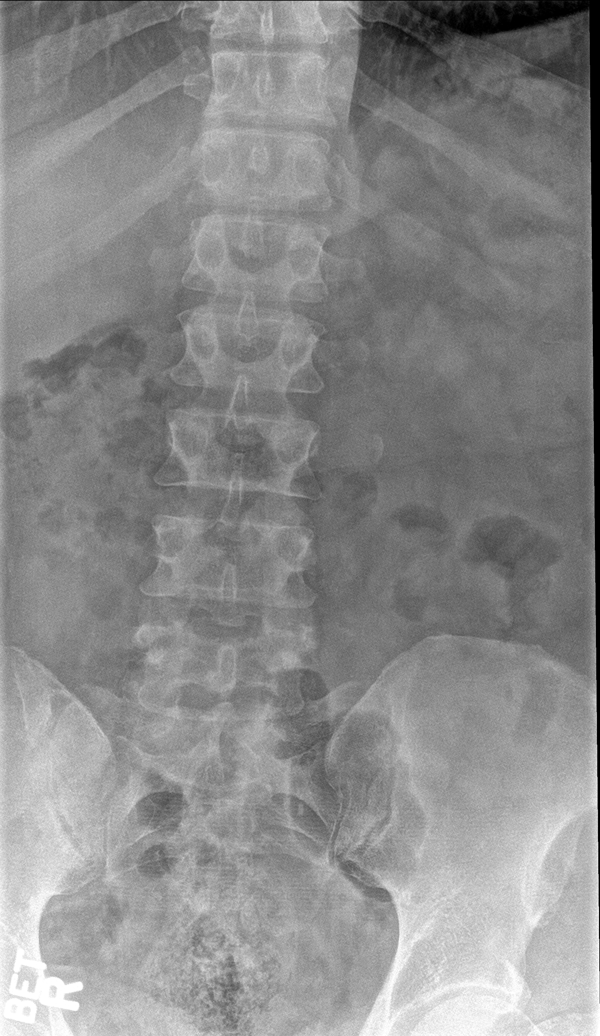

[l-spine obl (1 of 2)]
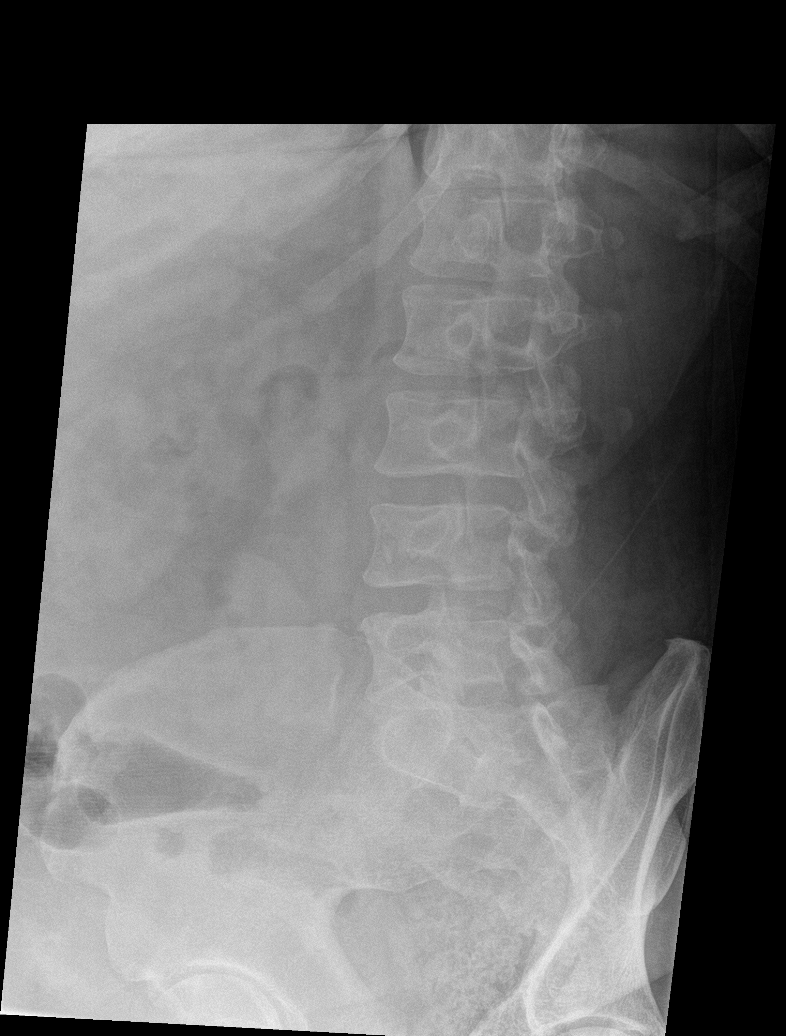

[l-spine obl (2 of 2)]
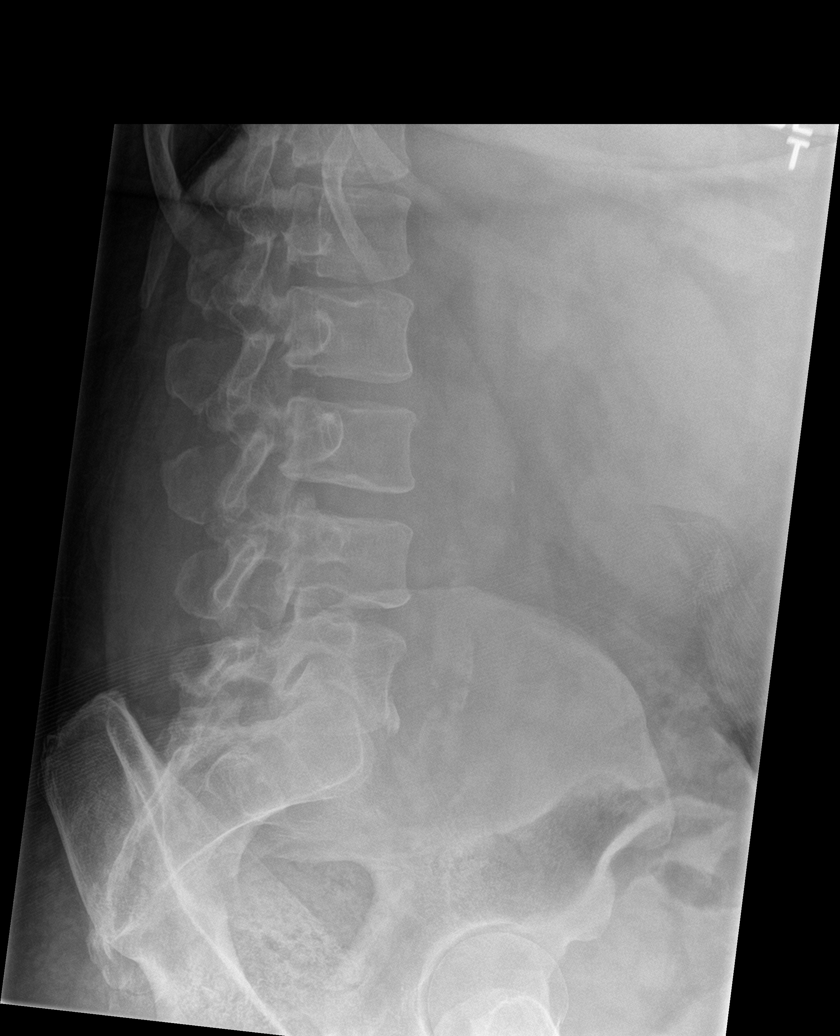

[l-spine lat]
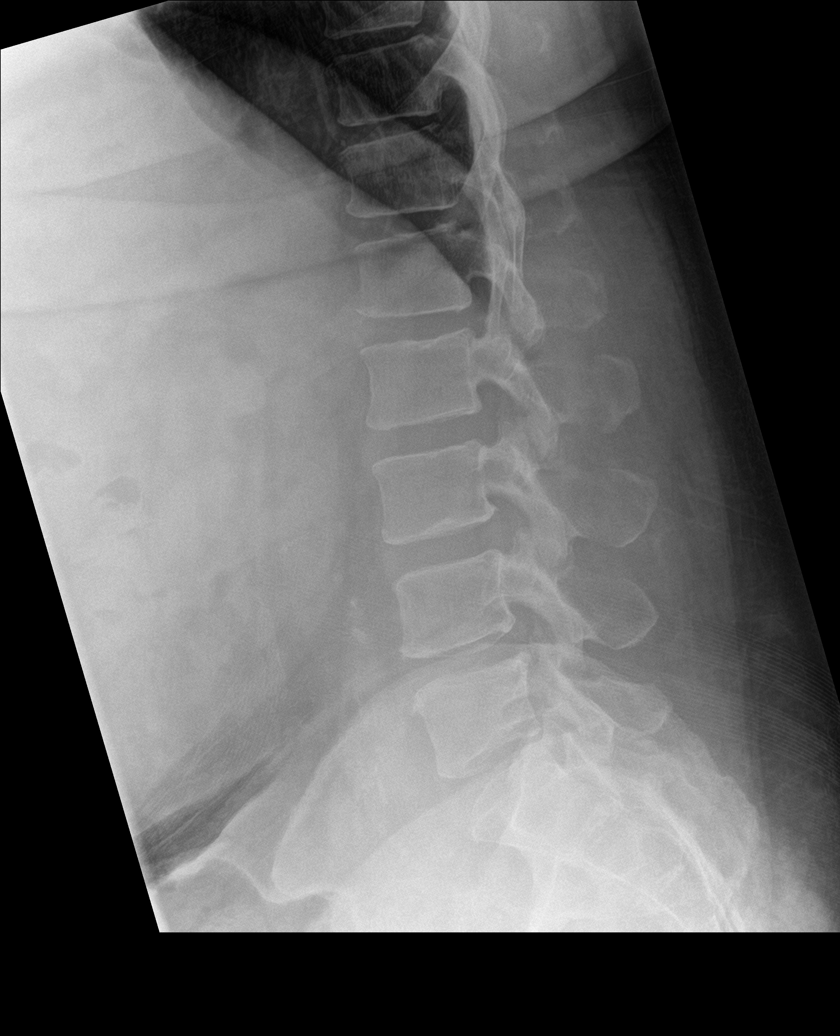

[l-spine spot]
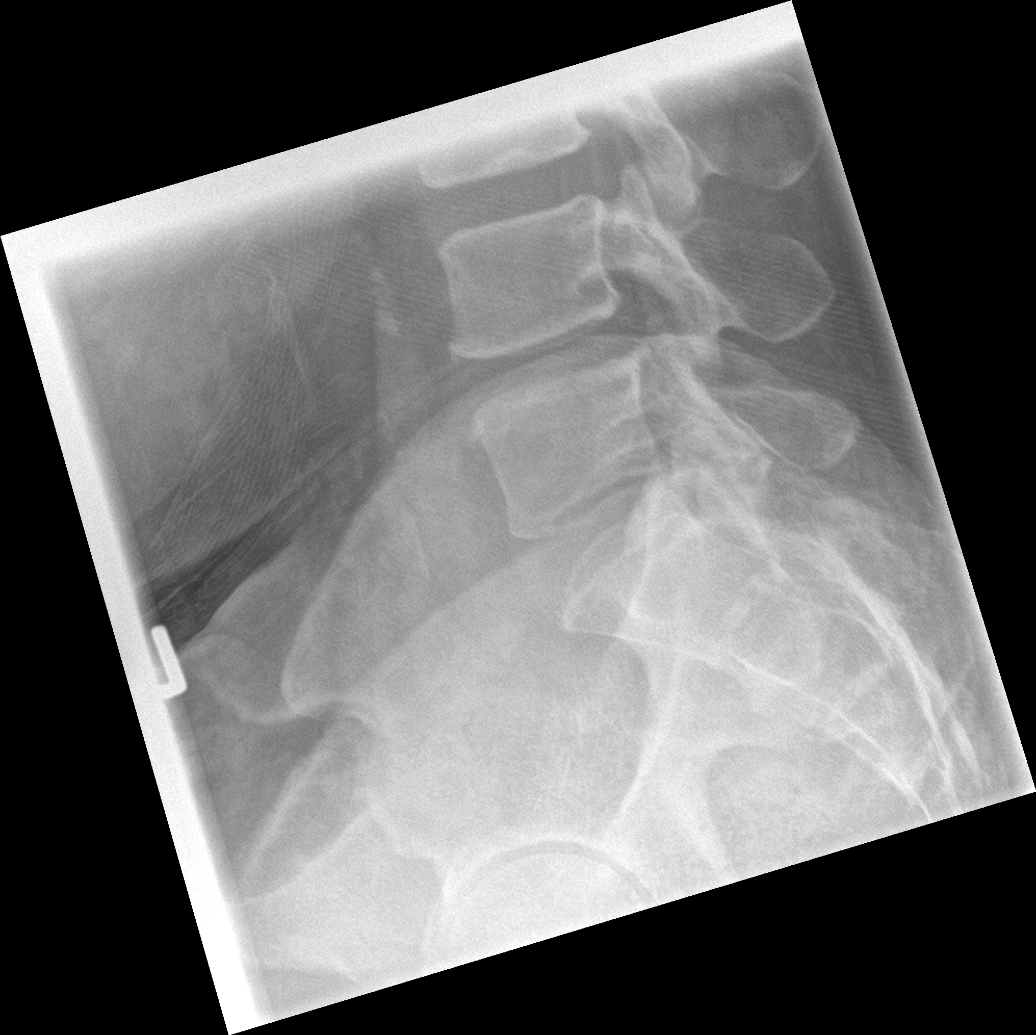

[5 of 5 positions shown; findings below may reference images not displayed]

FINDINGS: There is no evidence of lumbar spine fracture. Alignment is normal.
Intervertebral disc spaces are maintained.
IMPRESSION: Negative.

## 2016-04-08 ENCOUNTER — Encounter (HOSPITAL_COMMUNITY): Payer: Self-pay

## 2016-04-08 ENCOUNTER — Emergency Department (HOSPITAL_COMMUNITY)
Admission: EM | Admit: 2016-04-08 | Discharge: 2016-04-08 | Disposition: A | Discharge status: Home / Self Care | Payer: Medicaid Other | Attending: Emergency Medicine | Admitting: Emergency Medicine

## 2016-04-08 DIAGNOSIS — S0502XA Injury of conjunctiva and corneal abrasion without foreign body, left eye, initial encounter: Secondary | ICD-10-CM | POA: Diagnosis not present

## 2016-04-08 DIAGNOSIS — Z7982 Long term (current) use of aspirin: Secondary | ICD-10-CM | POA: Insufficient documentation

## 2016-04-08 DIAGNOSIS — Z79899 Other long term (current) drug therapy: Secondary | ICD-10-CM | POA: Insufficient documentation

## 2016-04-08 DIAGNOSIS — I10 Essential (primary) hypertension: Secondary | ICD-10-CM | POA: Diagnosis not present

## 2016-04-08 DIAGNOSIS — L02419 Cutaneous abscess of limb, unspecified: Secondary | ICD-10-CM

## 2016-04-08 DIAGNOSIS — Y999 Unspecified external cause status: Secondary | ICD-10-CM | POA: Diagnosis not present

## 2016-04-08 DIAGNOSIS — Z87891 Personal history of nicotine dependence: Secondary | ICD-10-CM | POA: Insufficient documentation

## 2016-04-08 DIAGNOSIS — J45909 Unspecified asthma, uncomplicated: Secondary | ICD-10-CM | POA: Diagnosis not present

## 2016-04-08 DIAGNOSIS — Y939 Activity, unspecified: Secondary | ICD-10-CM | POA: Diagnosis not present

## 2016-04-08 DIAGNOSIS — S0592XA Unspecified injury of left eye and orbit, initial encounter: Secondary | ICD-10-CM | POA: Diagnosis present

## 2016-04-08 DIAGNOSIS — Z7984 Long term (current) use of oral hypoglycemic drugs: Secondary | ICD-10-CM | POA: Insufficient documentation

## 2016-04-08 DIAGNOSIS — E119 Type 2 diabetes mellitus without complications: Secondary | ICD-10-CM | POA: Insufficient documentation

## 2016-04-08 DIAGNOSIS — L02411 Cutaneous abscess of right axilla: Secondary | ICD-10-CM | POA: Diagnosis not present

## 2016-04-08 DIAGNOSIS — Y929 Unspecified place or not applicable: Secondary | ICD-10-CM | POA: Diagnosis not present

## 2016-04-08 DIAGNOSIS — Z794 Long term (current) use of insulin: Secondary | ICD-10-CM | POA: Insufficient documentation

## 2016-04-08 DIAGNOSIS — X58XXXA Exposure to other specified factors, initial encounter: Secondary | ICD-10-CM | POA: Insufficient documentation

## 2016-04-08 MED ORDER — KETOROLAC TROMETHAMINE 0.5 % OP SOLN
1.0000 [drp] | Freq: Once | OPHTHALMIC | Status: AC
  Administered 2016-04-08: 1 [drp] via OPHTHALMIC
  Filled 2016-04-08: qty 5

## 2016-04-08 MED ORDER — TETRACAINE HCL 0.5 % OP SOLN
OPHTHALMIC | Status: AC
  Filled 2016-04-08: qty 4

## 2016-04-08 MED ORDER — DOXYCYCLINE HYCLATE 100 MG PO CAPS: 100.0000 mg | ORAL_CAPSULE | Freq: Two times a day (BID) | ORAL | 0 refills | Status: DC

## 2016-04-08 MED ORDER — TOBRAMYCIN 0.3 % OP SOLN
1.0000 [drp] | Freq: Once | OPHTHALMIC | Status: AC
  Administered 2016-04-08: 1 [drp] via OPHTHALMIC
  Filled 2016-04-08: qty 5

## 2016-04-08 MED ORDER — FLUORESCEIN SODIUM 1 MG OP STRP
ORAL_STRIP | OPHTHALMIC | Status: AC
  Filled 2016-04-08: qty 1

## 2016-04-08 MED ORDER — OXYCODONE-ACETAMINOPHEN 5-325 MG PO TABS
1.0000 | ORAL_TABLET | Freq: Once | ORAL | Status: AC
  Administered 2016-04-08: 1 via ORAL
  Filled 2016-04-08: qty 1

## 2016-04-08 NOTE — ED Notes (Signed)
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

## 2016-04-08 NOTE — Discharge Instructions (Signed)
Warm wet compresses on/off to the underarm. Apply 1 drop of the ketorolac 4 times a day and 1 drop of the tobramycin every 4 hrs.  Follow-up with the eye doctor listed if not improving.

## 2016-04-08 NOTE — ED Notes (Signed)
Patient given Ginger ale at this time. 

## 2016-04-08 NOTE — ED Triage Notes (Signed)
Irritation and discomfort to left eye x 1 week

## 2016-04-08 NOTE — ED Provider Notes (Signed)
[facility] DEPT Provider Note   CSN: [id number] Arrival date & time: 04/08/16  0037     History   Chief Complaint Chief Complaint  Patient presents with  . Eye Pain    HPI Shawn Newman is a 45 y.o. male.  HPI   Shawn Newman is a 45 y.o. male who presents to the Emergency Department complaining of left eye pain and foreign body sensation for one week.  He states that he has been rubbing his eye frequently and noticed sensitivity to air and light. Symptoms began after being outside.  His eye feels "itchy" at times and he has tried OTC allergy relief drops with only minimal relief.  He denies facial pain, redness or swelling of the eyelids, headaches or dizziness.  No reported visual changes or contact use.  He also complains of a recurrent "boil" to the right axilla for 2-3 days.  He complains of pain to the area with arm movement.  He denies drainage, redness, fever or chills.     Past Medical History:  Diagnosis Date  . Abscess   . Asthma   . Bronchitis   . Chronic back pain   . Chronic pain   . Diabetes mellitus   . Headache   . Hypertension   . OSA (obstructive sleep apnea)   . Sciatica   . Sleep apnea    uses cpap    Patient Active Problem List   Diagnosis Date Noted  . Chronic respiratory failure ([place]) 11/04/2013  . PNA (pneumonia) 10/31/2013  . Pneumonia 10/31/2013  . OSA (obstructive sleep apnea) 04/25/2012  . Dyspnea 04/11/2012  . Asthma 04/11/2012  . Morbid obesity ([place]) 04/11/2012    Past Surgical History:  Procedure Laterality Date  . NO PAST SURGERIES         Home Medications    Prior to Admission medications   Medication Sig Start Date End Date Taking? Authorizing Provider  ALPRAZolam Duanne Moron) 1 MG tablet Take 1 mg by mouth 4 (four) times daily. 01/08/15   Historical Provider, MD  amLODipine (NORVASC) 10 MG tablet Take 10 mg by mouth daily. 08/06/14   Historical Provider, MD  aspirin EC 81 MG tablet Take 81 mg by mouth daily.     Historical Provider, MD  atorvastatin (LIPITOR) 20 MG tablet Take 20 mg by mouth at bedtime. 09/01/14   Historical Provider, MD  budesonide-formoterol (SYMBICORT) 160-4.5 MCG/ACT inhaler Inhale 2 puffs into the lungs daily as needed (for shortness of breath and wheezing).    Historical Provider, MD  buPROPion (WELLBUTRIN XL) 300 MG 24 hr tablet Take 300 mg by mouth every morning. 01/08/15   Historical Provider, MD  carvedilol (COREG) 12.5 MG tablet Take 12.5 mg by mouth 2 (two) times daily with a meal.    Historical Provider, MD  cyclobenzaprine (FLEXERIL) 10 MG tablet Take 1 tablet (10 mg total) by mouth 2 (two) times daily as needed for muscle spasms. 01/04/16   Hope Bunnie Pion, NP  doxycycline (VIBRAMYCIN) 100 MG capsule Take 1 capsule (100 mg total) by mouth 2 (two) times daily. 11/04/15   Evalee Jefferson, PA-C  furosemide (LASIX) 20 MG tablet Take 1 tablet (20 mg total) by mouth daily. 11/02/13   Belkys A Regalado, MD  gabapentin (NEURONTIN) 100 MG capsule take 1 capsule by mouth every 8 hours 07/06/15   Historical Provider, MD  HYDROcodone-acetaminophen [facility]) 10-325 MG tablet Reported on 11/12/2015 10/22/15   Historical Provider, MD  insulin aspart (NOVOLOG) 100 UNIT/ML injection Inject 10  Units into the skin 3 (three) times daily with meals. 11/02/13   Belkys A Regalado, MD  LANTUS SOLOSTAR 100 UNIT/ML Solostar Pen Inject 15 Units into the skin at bedtime.  08/28/14   Historical Provider, MD  latanoprost (XALATAN) 0.005 % ophthalmic solution Place 1 drop into both eyes at bedtime. 08/21/14   Historical Provider, MD  metFORMIN (GLUCOPHAGE) 1000 MG tablet Take 1,000 mg by mouth 2 (two) times daily with a meal.      Historical Provider, MD  Nystatin POWD Apply to affected area BID 01/04/16   What Cheer, NP  omeprazole (PRILOSEC) 40 MG capsule Take 40 mg by mouth daily. 01/14/15   Historical Provider, MD  oxyCODONE-acetaminophen (PERCOCET/ROXICET) 5-325 MG tablet Take 1 tablet by mouth every 4 (four) hours as  needed. Patient not taking: Reported on 11/12/2015 11/04/15   Evalee Jefferson, PA-C  polyethylene glycol (MIRALAX / GLYCOLAX) packet Take 17 g by mouth daily. 11/12/15   [name], MD  trimethoprim-polymyxin b (POLYTRIM) ophthalmic solution Place 1 drop into the right eye every 3 (three) hours. For pink eye 11/08/15   Historical Provider, MD  VYVANSE 70 MG capsule Take 70 mg by mouth every morning.  07/03/15   Historical Provider, MD    Family History Family History  Problem Relation Age of Onset  . Hypertension Mother   . Diabetes Mother   . Hypertension Father   . Diabetes Father   . Diabetes Brother   . Hypertension Brother   . Colon cancer Sister     Social History Social History  Substance Use Topics  . Smoking status: Former Smoker    Packs/day: 0.00    Years: 24.00    Start date: 06/20/1987    Quit date: 12/18/2011  . Smokeless tobacco: Never Used  . Alcohol use No     Allergies   Sulfa antibiotics   Review of Systems Review of Systems  Constitutional: Negative for activity change, appetite change, chills and fever.  HENT: Negative for congestion, facial swelling, rhinorrhea, sore throat and trouble swallowing.   Eyes: Positive for pain and itching. Negative for visual disturbance.  Respiratory: Negative for cough, shortness of breath, wheezing and stridor.   Gastrointestinal: Negative for abdominal pain, nausea and vomiting.  Musculoskeletal: Negative for neck pain and neck stiffness.  Skin: Negative.        Abscess to right axilla  Neurological: Negative for dizziness, syncope, weakness, numbness and headaches.  Hematological: Negative for adenopathy.  Psychiatric/Behavioral: Negative for confusion.  All other systems reviewed and are negative.    Physical Exam Updated Vital Signs BP 128/72 (BP Location: Left Arm)   Pulse 73   Temp 98.1 F (36.7 C) (Oral)   Resp 22   Ht 5\' 11"  (1.803 m)   Wt (!) 173.7 kg   SpO2 94%   BMI 53.42 kg/m   Physical Exam    Constitutional: He is oriented to person, place, and time. He appears well-developed and well-nourished. No distress.  HENT:  Head: Normocephalic and atraumatic.  Mouth/Throat: Oropharynx is clear and moist.  Eyes: EOM are normal. Pupils are equal, round, and reactive to light. Lids are everted and swept, no foreign bodies found. Right conjunctiva is not injected. Left conjunctiva is not injected.  Fundoscopic exam:      The left eye shows no exudate and no papilledema.  Slit lamp exam:      The left eye shows fluorescein uptake. The left eye shows no corneal ulcer, no foreign body  and no hyphema.  Slit lamp exam reveals two punctate areas of fluorescein uptake.  negative Seidel's sign, no hyphema or FB   Neck: Normal range of motion. Neck supple. No thyromegaly present.  Cardiovascular: Normal rate, regular rhythm and intact distal pulses.   No murmur heard. Pulmonary/Chest: Effort normal and breath sounds normal. No respiratory distress.  Musculoskeletal: Normal range of motion.  Lymphadenopathy:    He has no cervical adenopathy.  Neurological: He is alert and oriented to person, place, and time. He exhibits normal muscle tone. Coordination normal.  Skin: Skin is warm and dry. No rash noted.  Quarter sized area of induration to the right axilla.  No significant erythema, no fluctuance.    Nursing note and vitals reviewed.    ED Treatments / Results  Labs (all labs ordered are listed, but only abnormal results are displayed) Labs Reviewed - No data to display  EKG  EKG Interpretation None       Radiology No results found.  Procedures Procedures (including critical care time)  Medications Ordered in ED Medications  fluorescein 1 MG ophthalmic strip (not administered)  tetracaine (PONTOCAINE) 0.5 % ophthalmic solution (not administered)     Initial Impression / Assessment and Plan / ED Course  I have reviewed the triage vital signs and the nursing  notes.  Pertinent labs & imaging results that were available during my care of the patient were reviewed by me and considered in my medical decision making (see chart for details).  Clinical Course      Visual Acuity  Right Eye Distance: 20/25 Left Eye Distance: 20/25 Bilateral Distance: 20/25   Patient is well-appearing. Vital signs are stable.  two small areas of fluorescein uptake that likely represent corneal abrasions.  Will tx with ketorolac and tobramycin.    Early abscess to the right axilla.  Pt declined I&D, prefers to take abx and warm compresses.  Advised that he will need to return for I&D if not improving, pt agrees.    Final Clinical Impressions(s) / ED Diagnoses   Final diagnoses:  Abrasion of left cornea, initial encounter  Abscess of axilla    New Prescriptions New Prescriptions   No medications on file     Bufford Lope [id number]    David Glick, MD [id number]

## 2016-07-19 ENCOUNTER — Encounter (HOSPITAL_COMMUNITY): Payer: Self-pay | Admitting: Emergency Medicine

## 2016-07-19 DIAGNOSIS — H5704 Mydriasis: Secondary | ICD-10-CM | POA: Diagnosis not present

## 2016-07-19 DIAGNOSIS — I10 Essential (primary) hypertension: Secondary | ICD-10-CM | POA: Insufficient documentation

## 2016-07-19 DIAGNOSIS — Z79899 Other long term (current) drug therapy: Secondary | ICD-10-CM | POA: Diagnosis not present

## 2016-07-19 DIAGNOSIS — Z794 Long term (current) use of insulin: Secondary | ICD-10-CM | POA: Diagnosis not present

## 2016-07-19 DIAGNOSIS — Z7982 Long term (current) use of aspirin: Secondary | ICD-10-CM | POA: Diagnosis not present

## 2016-07-19 DIAGNOSIS — H538 Other visual disturbances: Secondary | ICD-10-CM | POA: Diagnosis present

## 2016-07-19 DIAGNOSIS — E119 Type 2 diabetes mellitus without complications: Secondary | ICD-10-CM | POA: Diagnosis not present

## 2016-07-19 DIAGNOSIS — J45909 Unspecified asthma, uncomplicated: Secondary | ICD-10-CM | POA: Diagnosis not present

## 2016-07-19 NOTE — ED Triage Notes (Signed)
Pt states he seen eye doctor Monday and had eyes dilated and since then pt states his vision has been blurry.

## 2016-07-20 ENCOUNTER — Emergency Department (HOSPITAL_COMMUNITY)
Admission: EM | Admit: 2016-07-20 | Discharge: 2016-07-20 | Disposition: A | Discharge status: Home / Self Care | Payer: Medicaid Other | Attending: Emergency Medicine | Admitting: Emergency Medicine

## 2016-07-20 DIAGNOSIS — H5704 Mydriasis: Secondary | ICD-10-CM

## 2016-07-20 HISTORY — DX: Hyperlipidemia, unspecified: E78.5

## 2016-07-20 MED ORDER — TETRACAINE HCL 0.5 % OP SOLN
1.0000 [drp] | Freq: Once | OPHTHALMIC | Status: AC
  Administered 2016-07-20: 1 [drp] via OPHTHALMIC
  Filled 2016-07-20: qty 4

## 2016-07-20 NOTE — ED Notes (Signed)
ED Provider at bedside. 

## 2016-07-20 NOTE — ED Provider Notes (Signed)
[facility] DEPT Provider Note   CSN: [id number] Arrival date & time: 07/19/16  2135  By signing my name below, I, Shawn Newman, attest that this documentation has been prepared under the direction and in the presence of Shawn Porter, MD. Electronically Signed: Jeanell Newman, Scribe. 07/20/2016. 12:32 AM.  Time seen 12:28 AM  History   Chief Complaint Chief Complaint  Patient presents with  . Eye Problem   The history is provided by the patient. No language interpreter was used.   HPI Comments: Shawn Newman is a 46 y.o. male with a PMHx of glaucoma (right-eye) and DM who presents to the Emergency Department complaining of constant moderate bilateral blurry vision that started 2 days ago. He went to [facility] for routine checkup of his glaucoma without any concerns on January 29.  He was to be rechecked in 6 months. He has never had this happen after his visits. His blurry vision is relieved by using his reading glasses. He reports associated photophobia and mild generalized headache (onset yesterday and continues today). He was seen at Urgent Care today and was told to come to the ED because they can't refer him to a specialist.  He denies any hx of smoking/driving or other complaints.   PCP Shawn Newman in [facility]  Past Medical History:  Diagnosis Date  . Abscess   . Asthma   . Bronchitis   . Chronic back pain   . Chronic pain   . Diabetes mellitus   . Headache   . Hyperlipemia   . Hypertension   . OSA (obstructive sleep apnea)   . Sciatica   . Sleep apnea    uses cpap    Patient Active Problem List   Diagnosis Date Noted  . Chronic respiratory failure ([place]) 11/04/2013  . PNA (pneumonia) 10/31/2013  . Pneumonia 10/31/2013  . OSA (obstructive sleep apnea) 04/25/2012  . Dyspnea 04/11/2012  . Asthma 04/11/2012  . Morbid obesity ([place]) 04/11/2012    Past Surgical History:  Procedure Laterality Date  . NO PAST SURGERIES         Home  Medications    Prior to Admission medications   Medication Sig Start Date End Date Taking? Authorizing Provider  ALPRAZolam Shawn Newman) 1 MG tablet Take 1 mg by mouth 4 (four) times daily. 01/08/15   Historical Provider, MD  amLODipine (NORVASC) 10 MG tablet Take 10 mg by mouth daily. 08/06/14   Historical Provider, MD  aspirin EC 81 MG tablet Take 81 mg by mouth daily.    Historical Provider, MD  atorvastatin (LIPITOR) 20 MG tablet Take 20 mg by mouth at bedtime. 09/01/14   Historical Provider, MD  budesonide-formoterol (SYMBICORT) 160-4.5 MCG/ACT inhaler Inhale 2 puffs into the lungs daily as needed (for shortness of breath and wheezing).    Historical Provider, MD  buPROPion (WELLBUTRIN XL) 300 MG 24 hr tablet Take 300 mg by mouth every morning. 01/08/15   Historical Provider, MD  carvedilol (COREG) 12.5 MG tablet Take 12.5 mg by mouth 2 (two) times daily with a meal.    Historical Provider, MD  cyclobenzaprine (FLEXERIL) 10 MG tablet Take 1 tablet (10 mg total) by mouth 2 (two) times daily as needed for muscle spasms. 01/04/16   Shawn Bunnie Pion, NP  doxycycline (VIBRAMYCIN) 100 MG capsule Take 1 capsule (100 mg total) by mouth 2 (two) times daily. 04/08/16   Shawn Triplett, PA-C  furosemide (LASIX) 20 MG tablet Take 1 tablet (20 mg total) by mouth daily. 11/02/13  Shawn A Regalado, MD  gabapentin (NEURONTIN) 100 MG capsule take 1 capsule by mouth every 8 hours 07/06/15   Historical Provider, MD  HYDROcodone-acetaminophen [facility]) 10-325 MG tablet Reported on 11/12/2015 10/22/15   Historical Provider, MD  insulin aspart (NOVOLOG) 100 UNIT/ML injection Inject 10 Units into the skin 3 (three) times daily with meals. 11/02/13   Shawn A Regalado, MD  LANTUS SOLOSTAR 100 UNIT/ML Solostar Pen Inject 15 Units into the skin at bedtime.  08/28/14   Historical Provider, MD  latanoprost (XALATAN) 0.005 % ophthalmic solution Place 1 drop into both eyes at bedtime. 08/21/14   Historical Provider, MD  metFORMIN (GLUCOPHAGE)  1000 MG tablet Take 1,000 mg by mouth 2 (two) times daily with a meal.      Historical Provider, MD  Nystatin POWD Apply to affected area BID 01/04/16   [place], NP  omeprazole (PRILOSEC) 40 MG capsule Take 40 mg by mouth daily. 01/14/15   Historical Provider, MD  oxyCODONE-acetaminophen (PERCOCET/ROXICET) 5-325 MG tablet Take 1 tablet by mouth every 4 (four) hours as needed. Patient not taking: Reported on 11/12/2015 11/04/15   Shawn Jefferson, PA-C  polyethylene glycol (MIRALAX / GLYCOLAX) packet Take 17 g by mouth daily. 11/12/15   Shawn Sorrow, MD  trimethoprim-polymyxin b (POLYTRIM) ophthalmic solution Place 1 drop into the right eye every 3 (three) hours. For pink eye 11/08/15   Historical Provider, MD  VYVANSE 70 MG capsule Take 70 mg by mouth every morning.  07/03/15   Historical Provider, MD    Family History Family History  Problem Relation Age of Onset  . Hypertension Mother   . Diabetes Mother   . Hypertension Father   . Diabetes Father   . Diabetes Brother   . Hypertension Brother   . Colon cancer Sister     Social History Social History  Substance Use Topics  . Smoking status: Former Smoker    Packs/day: 0.00    Years: 24.00    Start date: 06/20/1987    Quit date: 12/18/2011  . Smokeless tobacco: Never Used  . Alcohol use No  on disability for AODM, HTN, OSA   Allergies   Sulfa antibiotics   Review of Systems Review of Systems  All other systems reviewed and are negative.  A complete 10 system review of systems was obtained and all systems are negative except as noted in the HPI and PMH.   Physical Exam Updated Vital Signs BP 141/82   Pulse 77   Temp 97.7 F (36.5 C) (Oral)   Resp 18   Ht 5\' 11"  (1.803 m)   Wt (!) 379 lb (171.9 kg)   SpO2 94%   BMI 52.86 kg/m   Physical Exam  Constitutional: He is oriented to person, place, and time. He appears well-developed and well-nourished.  Non-toxic appearance. He does not appear ill. No distress.  HENT:   Head: Normocephalic and atraumatic.  Right Ear: External ear normal.  Left Ear: External ear normal.  Nose: Nose normal.  Mouth/Throat: Mucous membranes are normal.  Eyes: Conjunctivae and EOM are normal.  Moderately dilated pupils bilaterally that are not reactive to light.   Neck: Normal range of motion and full passive range of motion without pain.  Cardiovascular: Normal rate.   Pulmonary/Chest: Effort normal. No respiratory distress. He has no rhonchi. He exhibits no crepitus.  Abdominal: Normal appearance.  Musculoskeletal: Normal range of motion.  Moves all extremities well.   Neurological: He is alert and oriented to person, place,  and time. He has normal strength. No cranial nerve deficit.  Skin: Skin is warm, dry and intact.  Psychiatric: He has a normal mood and affect. His speech is normal and behavior is normal. His mood appears not anxious.  Nursing note and vitals reviewed.    ED Treatments / Results  DIAGNOSTIC STUDIES: Oxygen Saturation is 94% on RA, normal by my interpretation.     Labs (all labs ordered are listed, but only abnormal results are displayed) Labs Reviewed - No data to display  EKG  EKG Interpretation None       Radiology No results found.  Procedures Procedures (including critical care time)  Medications Ordered in ED Medications  tetracaine (PONTOCAINE) 0.5 % ophthalmic solution 1 drop (not administered)     Initial Impression / Assessment and Plan / ED Course  I have reviewed the triage vital signs and the nursing notes.  Pertinent labs & imaging results that were available during my care of the patient were reviewed by me and considered in my medical decision making (see chart for details).    COORDINATION OF CARE: 12:36 AM- Pt advised of plan for treatment and pt agrees.  Tetracaine was placed in both eyes. Tono-Pen was used to measure his intraocular pressure. His right eye measured at 16 which is his eye with glaucoma,  his left eye measured 20. These are both within normal.  The patient that I feel this time when they dilated his pupils they used a long acting agent such as atropine or homatropine. It should gradually wear off hopefully over the next week. He can wear sunglasses to help with the light sensitivity. He also states that wearing readers helps with his blurred vision. He can take Motrin and Tylenol for his headache.  Final Clinical Impressions(s) / ED Diagnoses   Final diagnoses:  Pupil dilated    Plan discharge  Shawn Porter, MD, FACEP   I personally performed the services described in this documentation, which was scribed in my presence. The recorded information has been reviewed and considered.  Shawn Porter, MD, Barbette Or, MD 07/20/16 [phone]

## 2016-07-20 NOTE — ED Notes (Signed)
Pt and family updated on plan of care,  

## 2016-07-20 NOTE — Discharge Instructions (Signed)
Your pupils are still dilated from the eye drops used during your exam. Since it has been several days, it may have been atropine or homatropine. You may need to wear sunglasses until the effects wear off. You can take ibuprofen 600 mg + acetaminophen 500 mg 4 times a day for headache. Let your ophthalmologist know if it hasn't improved by a week or if it gets worse.  The pressure in your right eye tonight was 16 and in your left eye 20.

## 2016-07-20 NOTE — ED Notes (Signed)
Pt states that he had his eyes dilated at eye doctor's on Monday, continues to have blurry vision, vision sensitive to light, pt states " it is the same as if my eyes are still dilated": pt was seen at urgent care today and advised to come to er for further evaluation ,

## 2016-12-24 ENCOUNTER — Emergency Department (HOSPITAL_COMMUNITY)
Admission: EM | Admit: 2016-12-24 | Discharge: 2016-12-24 | Disposition: A | Discharge status: Home / Self Care | Payer: Medicaid Other | Attending: Emergency Medicine | Admitting: Emergency Medicine

## 2016-12-24 ENCOUNTER — Encounter (HOSPITAL_COMMUNITY): Payer: Self-pay | Admitting: *Deleted

## 2016-12-24 DIAGNOSIS — E119 Type 2 diabetes mellitus without complications: Secondary | ICD-10-CM | POA: Diagnosis not present

## 2016-12-24 DIAGNOSIS — Z87891 Personal history of nicotine dependence: Secondary | ICD-10-CM | POA: Diagnosis not present

## 2016-12-24 DIAGNOSIS — I10 Essential (primary) hypertension: Secondary | ICD-10-CM | POA: Insufficient documentation

## 2016-12-24 DIAGNOSIS — J45909 Unspecified asthma, uncomplicated: Secondary | ICD-10-CM | POA: Diagnosis not present

## 2016-12-24 DIAGNOSIS — Z794 Long term (current) use of insulin: Secondary | ICD-10-CM | POA: Diagnosis not present

## 2016-12-24 DIAGNOSIS — Z7982 Long term (current) use of aspirin: Secondary | ICD-10-CM | POA: Diagnosis not present

## 2016-12-24 DIAGNOSIS — J039 Acute tonsillitis, unspecified: Secondary | ICD-10-CM | POA: Insufficient documentation

## 2016-12-24 DIAGNOSIS — Z79899 Other long term (current) drug therapy: Secondary | ICD-10-CM | POA: Insufficient documentation

## 2016-12-24 DIAGNOSIS — J029 Acute pharyngitis, unspecified: Secondary | ICD-10-CM | POA: Diagnosis present

## 2016-12-24 LAB — RAPID STREP SCREEN (MED CTR MEBANE ONLY): STREPTOCOCCUS, GROUP A SCREEN (DIRECT): NEGATIVE

## 2016-12-24 MED ORDER — AMOXICILLIN 500 MG PO TABS: 500.0000 mg | ORAL_TABLET | Freq: Two times a day (BID) | ORAL | 0 refills | Status: AC

## 2016-12-24 MED ORDER — DEXAMETHASONE SODIUM PHOSPHATE 10 MG/ML IJ SOLN
10.0000 mg | Freq: Once | INTRAMUSCULAR | Status: AC
  Administered 2016-12-24: 10 mg via INTRAMUSCULAR
  Filled 2016-12-24: qty 1

## 2016-12-24 NOTE — ED Triage Notes (Signed)
Pt comes in with sore throat for 4 days. States pain is worse when he swallows. Pt has swelling to his throat, primarily to his left tonsil.

## 2016-12-24 NOTE — Discharge Instructions (Signed)
This is likely a viral tonsillitis that takes time to get better on it's own. If your symptoms do not improve after 1-2 weeks, take the course of antibiotics as prescribed.  Please return without fail for worsening symptoms, including any fever, difficulty breathing, difficulty swallowing saliva, difficulty moving your neck, worsening swelling, muffling of your voice, or any other symptoms concerning to you.

## 2016-12-24 NOTE — ED Provider Notes (Signed)
[facility] DEPT Provider Note   CSN: [id number] Arrival date & time: 12/24/16  1132     History   Chief Complaint Chief Complaint  Patient presents with  . Sore Throat    HPI Shawn Newman is a 46 y.o. male.  The history is provided by the patient.  Sore Throat  This is a new problem. The current episode started more than 2 days ago. The problem occurs constantly. The problem has not changed since onset.Pertinent negatives include no chest pain, no abdominal pain, no headaches and no shortness of breath. The symptoms are aggravated by swallowing. Nothing relieves the symptoms. He has tried nothing for the symptoms.   46 year old male who presents with sore throat for 4 days. History of DM. No known sick contacts. Denies any associating fevers, difficulty breathing, difficulty swallowing, cough, congestion, runny nose, nausea or vomiting, or abdominal pain. Feels like the back of his throat is a little swollen. No voice changes. No neck swelling.   Past Medical History:  Diagnosis Date  . Abscess   . Asthma   . Bronchitis   . Chronic back pain   . Chronic pain   . Diabetes mellitus   . Headache   . Hyperlipemia   . Hypertension   . OSA (obstructive sleep apnea)   . Sciatica   . Sleep apnea    uses cpap    Patient Active Problem List   Diagnosis Date Noted  . Chronic respiratory failure ([place]) 11/04/2013  . PNA (pneumonia) 10/31/2013  . Pneumonia 10/31/2013  . OSA (obstructive sleep apnea) 04/25/2012  . Dyspnea 04/11/2012  . Asthma 04/11/2012  . Morbid obesity ([place]) 04/11/2012    Past Surgical History:  Procedure Laterality Date  . NO PAST SURGERIES         Home Medications    Prior to Admission medications   Medication Sig Start Date End Date Taking? Authorizing Provider  ALPRAZolam Duanne Moron) 1 MG tablet Take 1 mg by mouth 4 (four) times daily. 01/08/15  Yes [provider]  amLODipine (NORVASC) 10 MG tablet Take 10 mg by mouth daily. 08/06/14   Yes [provider]  aspirin EC 81 MG tablet Take 81 mg by mouth daily.   Yes [provider]  atorvastatin (LIPITOR) 20 MG tablet Take 20 mg by mouth at bedtime. 09/01/14  Yes [provider]  bisoprolol (ZEBETA) 10 MG tablet Take 1 tablet by mouth daily. 12/01/16  Yes [provider]  budesonide-formoterol (SYMBICORT) 160-4.5 MCG/ACT inhaler Inhale 2 puffs into the lungs daily as needed (for shortness of breath and wheezing).   Yes [provider]  buPROPion (WELLBUTRIN XL) 300 MG 24 hr tablet Take 300 mg by mouth every morning. 01/08/15  Yes [provider]  gabapentin (NEURONTIN) 100 MG capsule take 1 capsule by mouth every 8 hours as needed for pain. 07/06/15  Yes [provider]  insulin aspart (NOVOLOG) 100 UNIT/ML injection Inject 10 Units into the skin 3 (three) times daily with meals. Patient taking differently: Inject 1-10 Units into the skin 3 (three) times daily with meals. Per sliding scale. 11/02/13  Yes Regalado, Belkys A, MD  LANTUS SOLOSTAR 100 UNIT/ML Solostar Pen Inject 15 Units into the skin as needed (sliding scale).  08/28/14  Yes [provider]  latanoprost (XALATAN) 0.005 % ophthalmic solution Place 1 drop into both eyes at bedtime. 08/21/14  Yes [provider]  metFORMIN (GLUCOPHAGE) 1000 MG tablet Take 1,000 mg by mouth 2 (two)  times daily with a meal.     Yes [provider]  omeprazole (PRILOSEC) 40 MG capsule Take 40 mg by mouth daily. 01/14/15  Yes [provider]  trimethoprim-polymyxin b (POLYTRIM) ophthalmic solution Place 1 drop into the right eye as needed (allergies).  11/08/15  Yes [provider]  VYVANSE 70 MG capsule Take 70 mg by mouth every morning.  07/03/15  Yes [provider]    Family History Family History  Problem Relation Age of Onset  . Hypertension Mother   . Diabetes Mother   . Hypertension Father   . Diabetes Father   . Diabetes  Brother   . Hypertension Brother   . Colon cancer Sister     Social History Social History  Substance Use Topics  . Smoking status: Former Smoker    Packs/day: 0.00    Years: 24.00    Start date: 06/20/1987    Quit date: 12/18/2011  . Smokeless tobacco: Never Used  . Alcohol use No     Allergies   Sulfa antibiotics   Review of Systems Review of Systems  Constitutional: Negative for fever.  HENT: Positive for sore throat. Negative for rhinorrhea.   Respiratory: Negative for shortness of breath.   Cardiovascular: Negative for chest pain.  Gastrointestinal: Negative for abdominal pain.  Neurological: Negative for headaches.  All other systems reviewed and are negative.    Physical Exam Updated Vital Signs BP (!) 145/86 (BP Location: Right Arm)   Pulse 76   Temp 97.9 F (36.6 C) (Oral)   Resp 18   Ht 5\' 11"  (1.803 m)   Wt (!) 173.7 kg (383 lb)   SpO2 93%   BMI 53.42 kg/m   Physical Exam Physical Exam  Nursing note and vitals reviewed. Constitutional: Well developed, well nourished, non-toxic, and in no acute distress Head: Normocephalic and atraumatic.  Mouth/Throat: Oropharynx is moist. There is enlargement of the left tonsil without any swelling of the tonsillar pillars. Uvula is midline. No trismus. No muffling of voice. No appreciable exudates.  Neck: Normal range of motion. Neck supple.  Cardiovascular: Normal rate and regular rhythm.   Pulmonary/Chest: Effort normal and breath sounds normal.  Abdominal: Soft. There is no tenderness. There is no rebound and no guarding.  Musculoskeletal: Normal range of motion.  Neurological: Alert, no facial droop, fluent speech, moves all extremities symmetrically Skin: Skin is warm and dry.  Psychiatric: Cooperative Lymph: No appreciable cervical lymphadenopathy.   ED Treatments / Results  Labs (all labs ordered are listed, but only abnormal results are displayed) Labs Reviewed  RAPID STREP SCREEN (NOT AT [facility])    CULTURE, GROUP A STREP [facility])    EKG  EKG Interpretation None       Radiology No results found.  Procedures Procedures (including critical care time)  Medications Ordered in ED Medications  dexamethasone (DECADRON) injection 10 mg (not administered)     Initial Impression / Assessment and Plan / ED Course  I have reviewed the triage vital signs and the nursing notes.  Pertinent labs & imaging results that were available during my care of the patient were reviewed by me and considered in my medical decision making (see chart for details).     Presents with 4 days sore throat, and sensation of swelling at the back of the throat. With enlarged left tonsil, but no peritonsillar swelling to suggest abscess. He is breathing comfortably, handling secretions, without voice changes. Normal neck extension. At this time there is no  concern for deep abscess of soft tissue of the neck.  Presentation consistent with tonsillitis. Rapid strep is negative. Suspect this is viral etiology. Given dose of decadron in ED. He is discharged with antibiotics, to trial if symptoms not improved after 1-2 weeks. Strict return and follow-up instructions reviewed. He expressed understanding of all discharge instructions and felt comfortable with the plan of care.   Final Clinical Impressions(s) / ED Diagnoses   Final diagnoses:  Tonsillitis    New Prescriptions New Prescriptions   No medications on file     Forde Dandy, MD 12/24/16 1220

## 2016-12-24 NOTE — ED Notes (Addendum)
Pt states that he had chills last night but no fever or body aches.  Pt states that he has pain and pressure in his ears.

## 2016-12-27 LAB — CULTURE, GROUP A STREP (THRC)

## 2017-04-04 ENCOUNTER — Telehealth (HOSPITAL_COMMUNITY): Payer: Self-pay | Admitting: Psychiatry

## 2017-06-01 ENCOUNTER — Encounter (HOSPITAL_COMMUNITY): Payer: Self-pay

## 2017-06-01 ENCOUNTER — Emergency Department (HOSPITAL_COMMUNITY): Payer: Medicaid Other

## 2017-06-01 ENCOUNTER — Emergency Department (HOSPITAL_COMMUNITY)
Admission: EM | Admit: 2017-06-01 | Discharge: 2017-06-01 | Disposition: A | Discharge status: Home / Self Care | Payer: Medicaid Other | Attending: Emergency Medicine | Admitting: Emergency Medicine

## 2017-06-01 DIAGNOSIS — R05 Cough: Secondary | ICD-10-CM | POA: Diagnosis present

## 2017-06-01 DIAGNOSIS — Z87891 Personal history of nicotine dependence: Secondary | ICD-10-CM | POA: Diagnosis not present

## 2017-06-01 DIAGNOSIS — J45909 Unspecified asthma, uncomplicated: Secondary | ICD-10-CM | POA: Insufficient documentation

## 2017-06-01 DIAGNOSIS — Z79899 Other long term (current) drug therapy: Secondary | ICD-10-CM | POA: Diagnosis not present

## 2017-06-01 DIAGNOSIS — I10 Essential (primary) hypertension: Secondary | ICD-10-CM | POA: Diagnosis not present

## 2017-06-01 DIAGNOSIS — Z7982 Long term (current) use of aspirin: Secondary | ICD-10-CM | POA: Diagnosis not present

## 2017-06-01 DIAGNOSIS — J069 Acute upper respiratory infection, unspecified: Secondary | ICD-10-CM | POA: Diagnosis not present

## 2017-06-01 MED ORDER — AZITHROMYCIN 250 MG PO TABS: ORAL_TABLET | ORAL | 0 refills | Status: AC

## 2017-06-01 MED ORDER — HYDROCODONE-HOMATROPINE 5-1.5 MG/5ML PO SYRP: 5.0000 mL | ORAL_SOLUTION | Freq: Four times a day (QID) | ORAL | 0 refills | Status: AC | PRN

## 2017-06-01 MED ORDER — ALBUTEROL SULFATE (2.5 MG/3ML) 0.083% IN NEBU: 2.5000 mg | INHALATION_SOLUTION | Freq: Four times a day (QID) | RESPIRATORY_TRACT | 2 refills | Status: AC | PRN

## 2017-06-01 MED ORDER — PREDNISONE 10 MG PO TABS: 20.0000 mg | ORAL_TABLET | Freq: Every day | ORAL | 0 refills | Status: AC

## 2017-06-01 NOTE — ED Notes (Signed)
Refused to change into gown, warm blanket given

## 2017-06-01 NOTE — ED Notes (Signed)
EDP aware bp instructed pt to continue to monitor and follow up with pcp.

## 2017-06-01 NOTE — Discharge Instructions (Signed)
Chest x-ray shows no pneumonia.  Prescription for antibiotic, inhaler, prednisone, albuterol solution for nebulizer machine.  Prednisone will make your sugar be elevated.

## 2017-06-01 NOTE — ED Triage Notes (Signed)
Pt reports cold symptoms x 1 month.  C/O pain in r shoulder blade.

## 2017-06-01 NOTE — ED Provider Notes (Signed)
[facility] EMERGENCY DEPARTMENT Provider Note   CSN: [id number] Arrival date & time: 06/01/17  1027     History   Chief Complaint Chief Complaint  Patient presents with  . URI    HPI Shawn Newman is a 46 y.o. male.  Persistent cough and wheezing for 1 month.  Patient is morbidly obese, he has chronic bronchitis and asthma, and uses a CPAP machine at home.  No fever, sweats, chills, rusty sputum.  Severity of symptoms is moderate.  He has been using his inhaler with minimal relief.  Nonsmoker      Past Medical History:  Diagnosis Date  . Abscess   . Asthma   . Bronchitis   . Chronic back pain   . Chronic pain   . Diabetes mellitus   . Headache   . Hyperlipemia   . Hypertension   . OSA (obstructive sleep apnea)   . Sciatica   . Sleep apnea    uses cpap    Patient Active Problem List   Diagnosis Date Noted  . Chronic respiratory failure ([place]) 11/04/2013  . PNA (pneumonia) 10/31/2013  . Pneumonia 10/31/2013  . OSA (obstructive sleep apnea) 04/25/2012  . Dyspnea 04/11/2012  . Asthma 04/11/2012  . Morbid obesity ([place]) 04/11/2012    Past Surgical History:  Procedure Laterality Date  . NO PAST SURGERIES         Home Medications    Prior to Admission medications   Medication Sig Start Date End Date Taking? Authorizing Provider  albuterol (PROVENTIL) (2.5 MG/3ML) 0.083% nebulizer solution Take 3 mLs (2.5 mg total) by nebulization every 6 (six) hours as needed for wheezing or shortness of breath. 06/01/17   Nat Christen, MD  ALPRAZolam Duanne Moron) 1 MG tablet Take 1 mg by mouth 4 (four) times daily. 01/08/15   [provider]  amLODipine (NORVASC) 10 MG tablet Take 10 mg by mouth daily. 08/06/14   [provider]  aspirin EC 81 MG tablet Take 81 mg by mouth daily.    [provider]  atorvastatin (LIPITOR) 20 MG tablet Take 20 mg by mouth at bedtime. 09/01/14   [provider]  azithromycin (ZITHROMAX Z-PAK) 250 MG tablet As  directed 06/01/17   Nat Christen, MD  bisoprolol (ZEBETA) 10 MG tablet Take 1 tablet by mouth daily. 12/01/16   [provider]  budesonide-formoterol (SYMBICORT) 160-4.5 MCG/ACT inhaler Inhale 2 puffs into the lungs daily as needed (for shortness of breath and wheezing).    [provider]  buPROPion (WELLBUTRIN XL) 300 MG 24 hr tablet Take 300 mg by mouth every morning. 01/08/15   [provider]  gabapentin (NEURONTIN) 100 MG capsule take 1 capsule by mouth every 8 hours as needed for pain. 07/06/15   [provider]  HYDROcodone-homatropine (HYCODAN) 5-1.5 MG/5ML syrup Take 5 mLs by mouth every 6 (six) hours as needed for cough. 06/01/17   Nat Christen, MD  insulin aspart (NOVOLOG) 100 UNIT/ML injection Inject 10 Units into the skin 3 (three) times daily with meals. Patient taking differently: Inject 1-10 Units into the skin 3 (three) times daily with meals. Per sliding scale. 11/02/13   Regalado, Belkys A, MD  LANTUS SOLOSTAR 100 UNIT/ML Solostar Pen Inject 15 Units into the skin as needed (sliding scale).  08/28/14   [provider]  latanoprost (XALATAN) 0.005 % ophthalmic solution Place 1 drop into both eyes at bedtime. 08/21/14   [provider]  metFORMIN (GLUCOPHAGE) 1000 MG tablet Take 1,000 mg  by mouth 2 (two) times daily with a meal.      [provider]  omeprazole (PRILOSEC) 40 MG capsule Take 40 mg by mouth daily. 01/14/15   [provider]  predniSONE (DELTASONE) 10 MG tablet Take 2 tablets (20 mg total) by mouth daily. 06/01/17   Nat Christen, MD  trimethoprim-polymyxin b (POLYTRIM) ophthalmic solution Place 1 drop into the right eye as needed (allergies).  11/08/15   [provider]  VYVANSE 70 MG capsule Take 70 mg by mouth every morning.  07/03/15   [provider]    Family History Family History  Problem Relation Age of Onset  . Hypertension Mother   . Diabetes Mother   . Hypertension Father     . Diabetes Father   . Diabetes Brother   . Hypertension Brother   . Colon cancer Sister     Social History Social History   Tobacco Use  . Smoking status: Former Smoker    Packs/day: 0.00    Years: 24.00    Pack years: 0.00    Start date: 06/20/1987    Last attempt to quit: 12/18/2011    Years since quitting: 5.4  . Smokeless tobacco: Never Used  Substance Use Topics  . Alcohol use: No  . Drug use: No     Allergies   Sulfa antibiotics   Review of Systems Review of Systems  All other systems reviewed and are negative.    Physical Exam Updated Vital Signs BP (!) 208/108 (BP Location: Right Arm)   Pulse 94   Temp 98 F (36.7 C) (Oral)   Resp 20   Ht 5\' 11"  (1.803 m)   Wt (!) 174.2 kg (384 lb)   SpO2 93%   BMI 53.56 kg/m   Physical Exam  Constitutional: He is oriented to person, place, and time.  obese  HENT:  Head: Normocephalic and atraumatic.  Eyes: Conjunctivae are normal.  Neck: Neck supple.  Cardiovascular: Normal rate and regular rhythm.  Pulmonary/Chest:  Bilateral expiratory wheezes.  Abdominal: Soft. Bowel sounds are normal.  Musculoskeletal: Normal range of motion.  Neurological: He is alert and oriented to person, place, and time.  Skin: Skin is warm and dry.  Psychiatric: He has a normal mood and affect. His behavior is normal.  Nursing note and vitals reviewed.    ED Treatments / Results  Labs (all labs ordered are listed, but only abnormal results are displayed) Labs Reviewed - No data to display  EKG  EKG Interpretation None       Radiology Dg Chest 2 View  Result Date: 06/01/2017 CLINICAL DATA:  Cough for 1 month.  Former smoker. EXAM: CHEST  2 VIEW COMPARISON:  05/16/2017 FINDINGS: Stable peripheral linear densities in the left lung. Findings are most compatible with scarring. Otherwise, the lungs are clear. Heart and mediastinum are within normal limits. Trachea is midline. No large pleural effusions. IMPRESSION: No active  cardiopulmonary disease. Electronically Signed   By: Markus Daft M.D.   On: 06/01/2017 11:32    Procedures Procedures (including critical care time)  Medications Ordered in ED Medications - No data to display   Initial Impression / Assessment and Plan / ED Course  I have reviewed the triage vital signs and the nursing notes.  Pertinent labs & imaging results that were available during my care of the patient were reviewed by me and considered in my medical decision making (see chart for details).    Patient is in no severe  respiratory distress.  He is obviously wheezing and coughing.  Chest x-ray negative.  Will prescribe Zithromax, prednisone, albuterol inhaler, Hycodan cough syrup.   Final Clinical Impressions(s) / ED Diagnoses   Final diagnoses:  Upper respiratory tract infection, unspecified type    ED Discharge Orders        Ordered    azithromycin (ZITHROMAX Z-PAK) 250 MG tablet     06/01/17 1249    predniSONE (DELTASONE) 10 MG tablet  Daily     06/01/17 1249    albuterol (PROVENTIL) (2.5 MG/3ML) 0.083% nebulizer solution  Every 6 hours PRN     06/01/17 1249    HYDROcodone-homatropine (HYCODAN) 5-1.5 MG/5ML syrup  Every 6 hours PRN     06/01/17 1249       Nat Christen, MD 06/01/17 1314

## 2017-10-05 ENCOUNTER — Emergency Department (HOSPITAL_COMMUNITY): Payer: Medicaid Other

## 2017-10-05 ENCOUNTER — Encounter (HOSPITAL_COMMUNITY): Payer: Self-pay

## 2017-10-05 ENCOUNTER — Other Ambulatory Visit: Payer: Self-pay

## 2017-10-05 ENCOUNTER — Emergency Department (HOSPITAL_COMMUNITY)
Admission: EM | Admit: 2017-10-05 | Discharge: 2017-10-05 | Disposition: A | Discharge status: Home / Self Care | Payer: Medicaid Other | Attending: Emergency Medicine | Admitting: Emergency Medicine

## 2017-10-05 DIAGNOSIS — Z794 Long term (current) use of insulin: Secondary | ICD-10-CM | POA: Diagnosis not present

## 2017-10-05 DIAGNOSIS — J45909 Unspecified asthma, uncomplicated: Secondary | ICD-10-CM | POA: Diagnosis not present

## 2017-10-05 DIAGNOSIS — R079 Chest pain, unspecified: Secondary | ICD-10-CM | POA: Insufficient documentation

## 2017-10-05 DIAGNOSIS — Z79899 Other long term (current) drug therapy: Secondary | ICD-10-CM | POA: Insufficient documentation

## 2017-10-05 DIAGNOSIS — I10 Essential (primary) hypertension: Secondary | ICD-10-CM | POA: Insufficient documentation

## 2017-10-05 DIAGNOSIS — M546 Pain in thoracic spine: Secondary | ICD-10-CM | POA: Insufficient documentation

## 2017-10-05 DIAGNOSIS — Z7982 Long term (current) use of aspirin: Secondary | ICD-10-CM | POA: Diagnosis not present

## 2017-10-05 DIAGNOSIS — Z87891 Personal history of nicotine dependence: Secondary | ICD-10-CM | POA: Insufficient documentation

## 2017-10-05 DIAGNOSIS — E119 Type 2 diabetes mellitus without complications: Secondary | ICD-10-CM | POA: Insufficient documentation

## 2017-10-05 LAB — CBC
HCT: 48.7 % (ref 39.0–52.0)
Hemoglobin: 15.2 g/dL (ref 13.0–17.0)
MCH: 27.3 pg (ref 26.0–34.0)
MCHC: 31.2 g/dL (ref 30.0–36.0)
MCV: 87.4 fL (ref 78.0–100.0)
Platelets: 272 10*3/uL (ref 150–400)
RBC: 5.57 MIL/uL (ref 4.22–5.81)
RDW: 13.4 % (ref 11.5–15.5)
WBC: 6.4 10*3/uL (ref 4.0–10.5)

## 2017-10-05 LAB — BASIC METABOLIC PANEL
ANION GAP: 13 (ref 5–15)
BUN: 11 mg/dL (ref 6–20)
CHLORIDE: 105 mmol/L (ref 101–111)
CO2: 23 mmol/L (ref 22–32)
Calcium: 9.6 mg/dL (ref 8.9–10.3)
Creatinine, Ser: 0.8 mg/dL (ref 0.61–1.24)
GFR calc Af Amer: >60 mL/min (ref >60)
GFR calc non Af Amer: >60 mL/min (ref >60)
Glucose, Bld: 161 mg/dL — ABNORMAL HIGH (ref 65–99)
POTASSIUM: 3.7 mmol/L (ref 3.5–5.1)
SODIUM: 141 mmol/L (ref 135–145)

## 2017-10-05 LAB — TROPONIN I

## 2017-10-05 MED ORDER — IOPAMIDOL (ISOVUE-370) INJECTION 76%
100.0000 mL | Freq: Once | INTRAVENOUS | Status: AC | PRN
  Administered 2017-10-05: 100 mL via INTRAVENOUS

## 2017-10-05 MED ORDER — NAPROXEN 500 MG PO TABS: 500.0000 mg | ORAL_TABLET | Freq: Two times a day (BID) | ORAL | 0 refills | Status: AC

## 2017-10-05 MED ORDER — CYCLOBENZAPRINE HCL 5 MG PO TABS: 5.0000 mg | ORAL_TABLET | Freq: Every evening | ORAL | 0 refills | Status: DC | PRN

## 2017-10-05 MED ORDER — IBUPROFEN 800 MG PO TABS
800.0000 mg | ORAL_TABLET | Freq: Once | ORAL | Status: AC
  Administered 2017-10-05: 800 mg via ORAL
  Filled 2017-10-05: qty 1

## 2017-10-05 NOTE — ED Provider Notes (Signed)
[facility] EMERGENCY DEPARTMENT Provider Note   CSN: [id number] Arrival date & time: 10/05/17  1156     History   Chief Complaint Chief Complaint  Patient presents with  . Back Pain    HPI Shawn Newman is a 47 y.o. male presenting for evaluation of R back/shoulder pain.  Pt states that 5 days ago , he developed pain of his R thoracic back located under his R shoulder. This began gradually, and has progressively worsened. It is described as a throbbing/pulsing pain. He has taken ibuprofen, which mildly improves the pain. No increased pain with activity, movement, or inspiration. He denies fevers, chills, cough, anterior CP, SOB, N/V, abd pain, urinary sxs, or abnormal BMs. He denies fall, trauma, or injury. He denies h/o similar pain. He denies numbness, tingling, weakness, or radiation of the pain. He states it feels like it is deep in his chest. He does not smoke, drink etoh, or any other drug use. No other cardiac history.  No increased pain post-prandial.  HPI  Past Medical History:  Diagnosis Date  . Abscess   . Asthma   . Bronchitis   . Chronic back pain   . Chronic pain   . Diabetes mellitus   . Headache   . Hyperlipemia   . Hypertension   . OSA (obstructive sleep apnea)   . Sciatica   . Sleep apnea    uses cpap    Patient Active Problem List   Diagnosis Date Noted  . Chronic respiratory failure ([place]) 11/04/2013  . PNA (pneumonia) 10/31/2013  . Pneumonia 10/31/2013  . OSA (obstructive sleep apnea) 04/25/2012  . Dyspnea 04/11/2012  . Asthma 04/11/2012  . Morbid obesity ([place]) 04/11/2012    Past Surgical History:  Procedure Laterality Date  . NO PAST SURGERIES          Home Medications    Prior to Admission medications   Medication Sig Start Date End Date Taking? Authorizing Provider  albuterol (PROVENTIL) (2.5 MG/3ML) 0.083% nebulizer solution Take 3 mLs (2.5 mg total) by nebulization every 6 (six) hours as needed for wheezing or shortness of  breath. 06/01/17   Nat Christen, MD  ALPRAZolam Duanne Moron) 1 MG tablet Take 1 mg by mouth 4 (four) times daily. 01/08/15   [provider]  amLODipine (NORVASC) 10 MG tablet Take 10 mg by mouth daily. 08/06/14   [provider]  aspirin EC 81 MG tablet Take 81 mg by mouth daily.    [provider]  atorvastatin (LIPITOR) 20 MG tablet Take 20 mg by mouth at bedtime. 09/01/14   [provider]  azithromycin (ZITHROMAX Z-PAK) 250 MG tablet As directed 06/01/17   Nat Christen, MD  bisoprolol (ZEBETA) 10 MG tablet Take 1 tablet by mouth daily. 12/01/16   [provider]  budesonide-formoterol (SYMBICORT) 160-4.5 MCG/ACT inhaler Inhale 2 puffs into the lungs daily as needed (for shortness of breath and wheezing).    [provider]  buPROPion (WELLBUTRIN XL) 300 MG 24 hr tablet Take 300 mg by mouth every morning. 01/08/15   [provider]  cyclobenzaprine (FLEXERIL) 5 MG tablet Take 1 tablet (5 mg total) by mouth at bedtime as needed for muscle spasms. 10/05/17   Hrishikesh Hoeg, PA-C  gabapentin (NEURONTIN) 100 MG capsule take 1 capsule by mouth every 8 hours as needed for pain. 07/06/15   [provider]  HYDROcodone-homatropine (HYCODAN) 5-1.5 MG/5ML syrup Take 5 mLs by mouth every 6 (six) hours as needed for cough. 06/01/17  Nat Christen, MD  insulin aspart (NOVOLOG) 100 UNIT/ML injection Inject 10 Units into the skin 3 (three) times daily with meals. Patient taking differently: Inject 1-10 Units into the skin 3 (three) times daily with meals. Per sliding scale. 11/02/13   Regalado, Belkys A, MD  LANTUS SOLOSTAR 100 UNIT/ML Solostar Pen Inject 15 Units into the skin as needed (sliding scale).  08/28/14   [provider]  latanoprost (XALATAN) 0.005 % ophthalmic solution Place 1 drop into both eyes at bedtime. 08/21/14   [provider]  metFORMIN (GLUCOPHAGE) 1000 MG tablet Take 1,000 mg by mouth 2 (two) times daily with a  meal.      [provider]  naproxen (NAPROSYN) 500 MG tablet Take 1 tablet (500 mg total) by mouth 2 (two) times daily. 10/05/17   Zeplin Aleshire, PA-C  omeprazole (PRILOSEC) 40 MG capsule Take 40 mg by mouth daily. 01/14/15   [provider]  predniSONE (DELTASONE) 10 MG tablet Take 2 tablets (20 mg total) by mouth daily. 06/01/17   Nat Christen, MD  trimethoprim-polymyxin b (POLYTRIM) ophthalmic solution Place 1 drop into the right eye as needed (allergies).  11/08/15   [provider]  VYVANSE 70 MG capsule Take 70 mg by mouth every morning.  07/03/15   [provider]    Family History Family History  Problem Relation Age of Onset  . Hypertension Mother   . Diabetes Mother   . Hypertension Father   . Diabetes Father   . Diabetes Brother   . Hypertension Brother   . Colon cancer Sister     Social History Social History   Tobacco Use  . Smoking status: Former Smoker    Packs/day: 0.00    Years: 24.00    Pack years: 0.00    Start date: 06/20/1987    Last attempt to quit: 12/18/2011    Years since quitting: 5.8  . Smokeless tobacco: Never Used  Substance Use Topics  . Alcohol use: No  . Drug use: No     Allergies   Sulfa antibiotics   Review of Systems Review of Systems  Musculoskeletal: Positive for back pain.  All other systems reviewed and are negative.    Physical Exam Updated Vital Signs BP (!) 151/89 (BP Location: Right Arm)   Pulse 77   Temp 97.8 F (36.6 C) (Oral)   Resp 15   SpO2 94%   Physical Exam  Constitutional: He is oriented to person, place, and time. He appears well-developed and well-nourished. No distress.  Obese male sitting comfortably in NAD  HENT:  Head: Normocephalic and atraumatic.  Eyes: Pupils are equal, round, and reactive to light. Conjunctivae and EOM are normal.  Neck: Normal range of motion. Neck supple.  Cardiovascular: Normal rate, regular rhythm and intact distal pulses.    Pulmonary/Chest: Effort normal and breath sounds normal. No respiratory distress. He has no wheezes. He exhibits no tenderness.  Speaking in full sentences  Abdominal: Soft. He exhibits no distension and no mass. There is no tenderness. There is no rebound and no guarding.  No TTP of abd. Soft without rigidity, guarding, or distention. Negative murphys.   Musculoskeletal: Normal range of motion. He exhibits no edema or tenderness.  No TTP of back. Full active ROM of arm and back without increased pain. Grip strength intact bilaterally. Radial pulses equal bilaterally. Arm strength equal bilaterally.   Neurological: He is alert and oriented to person, place, and time. No sensory deficit.  Skin: Skin  is warm and dry.  Psychiatric: He has a normal mood and affect.  Nursing note and vitals reviewed.    ED Treatments / Results  Labs (all labs ordered are listed, but only abnormal results are displayed) Labs Reviewed  BASIC METABOLIC PANEL - Abnormal; Notable for the following components:      Result Value   Glucose, Bld 161 (*)    All other components within normal limits  CBC  TROPONIN I    EKG EKG Interpretation  Date/Time:  Friday October 05 2017 15:22:32 EDT Ventricular Rate:  79 PR Interval:    QRS Duration: 104 QT Interval:  380 QTC Calculation: 436 R Axis:   -49 Text Interpretation:  Sinus rhythm Left axis deviation Low voltage, precordial leads Confirmed by Merrily Pew [phone]) on 10/05/2017 3:52:28 PM   Radiology Ct Angio Chest Aorta W/cm &/or Wo/cm  Result Date: 10/05/2017 CLINICAL DATA:  Chest pain, short of breath, back pain. Concern for aortic dissection EXAM: CT ANGIOGRAPHY CHEST WITH CONTRAST TECHNIQUE: Multidetector CT imaging of the chest was performed using the standard protocol during bolus administration of intravenous contrast. Multiplanar CT image reconstructions and MIPs were obtained to evaluate the vascular anatomy. CONTRAST:  13mL ISOVUE-370 IOPAMIDOL  (ISOVUE-370) INJECTION 76% COMPARISON:  None. FINDINGS: Cardiovascular: Non IV contrast images demonstrate no intramural hematoma within the thoracic aorta. Contrast enhanced images demonstrate no dissection or aneurysm of the thoracic aorta. Great vessels are normal. The exam is not timed optimally for evaluation of the pulmonary arteries. No gross pulmonary emboli within the proximal pulmonary arteries. The more distal pulmonary arteries are not evaluated Coronary artery calcification and aortic atherosclerotic calcification. Mediastinum/Nodes: No axillary supraclavicular adenopathy. No mediastinal hilar adenopathy. Esophagus normal. Lungs/Pleura: No pulmonary infarction. No pulmonary edema or infiltrate. Airways normal. Upper Abdomen: Limited view of the liver, kidneys, pancreas are unremarkable. Rounded enlargement of the LEFT adrenal gland to 4.8 cm with low-attenuation (HU equal -5). Musculoskeletal: No aggressive osseous lesion. Review of the MIP images confirms the above findings. IMPRESSION: 1. No evidence of aortic dissection or aneurysm. 2. Exam suboptimal for evaluating for acute pulmonary emboli due to timing for aortic evaluation. No large proximal pulmonary emboli present. Segmental pulmonary arteries are not evaluated. 3. No acute pulmonary parenchymal findings. 4. Benign LEFT adrenal adenoma versus adrenal myelolipoma. Consider follow-up CT in 6-12 months (adrenal protocol) to evaluate for size stability as there some risk of hemorrhage in the larger of these benign lesions. Electronically Signed   By: Suzy Bouchard M.D.   On: 10/05/2017 15:31    Procedures Procedures (including critical care time)  Medications Ordered in ED Medications  ibuprofen (ADVIL,MOTRIN) tablet 800 mg (800 mg Oral Given 10/05/17 1436)  iopamidol (ISOVUE-370) 76 % injection 100 mL (100 mLs Intravenous Contrast Given 10/05/17 1440)     Initial Impression / Assessment and Plan / ED Course  I have reviewed the  triage vital signs and the nursing notes.  Pertinent labs & imaging results that were available during my care of the patient were reviewed by me and considered in my medical decision making (see chart for details).     Patient presenting for evaluation of right sided thoracic back pain.  Physical exam shows patient who appears in no distress.  However, pain is not reproducible on exam and not increased with range of motion.  Concern for more serious cause of pain including PE, iliac cause, dissection, or cholelithiasis.  However, patient without cough, shortness of breath, chest pain, nausea, vomiting, or  abdominal pain.  Discussed case with attending, Dr. Roderic Palau evaluated the patient. Will obtain labs and CTA to assess the vessels.  Labs reassuring, no leukocytosis.  Electrolytes stable.  Troponin negative.  Kidney function stable.  EKG without ST elevation.  Ibuprofen tablet given for pain.  CTA without signs of dissection or large PE.  No source of patient's symptoms found.  Discussed findings with patient.  Discussed that this may be due to muscular cause, will treat with NSAIDs and muscle relaxer.  Follow-up with primary care for further evaluation.  At this time, patient appears safe for discharge.  Return precautions given.  Patient states he understands and agrees to plan.  Final Clinical Impressions(s) / ED Diagnoses   Final diagnoses:  Acute right-sided thoracic back pain    ED Discharge Orders        Ordered    naproxen (NAPROSYN) 500 MG tablet  2 times daily     10/05/17 1558    cyclobenzaprine (FLEXERIL) 5 MG tablet  At bedtime PRN     10/05/17 1558       Tahiry Spicer, PA-C 10/05/17 1621    Milton Ferguson, MD 10/09/17 [phone]

## 2017-10-05 NOTE — Discharge Instructions (Addendum)
Take naproxen 2 times a day with meals.  Do not take other anti-inflammatories at the same time open (Advil, Motrin, ibuprofen, Aleve). You may supplement with Tylenol if you need further pain control. Use ice packs or heating pads if this helps control your pain. Use Flexeril as needed for continued pain.  Have caution, as this may make you tired or groggy.  Do not drive or operate heavy machinery while you are taking this medicine. Follow-up with your primary care doctor if your symptoms are not improving. Return to the emergency room if you develop chest pain, difficulty breathing, high fevers, or any new or concerning symptoms.

## 2017-10-05 NOTE — ED Triage Notes (Signed)
Pt reports pain to back below right shoulder blade for 4-5 days. No known injury. Reports he woke up pain and feels like a constant pulling

## 2018-04-30 ENCOUNTER — Emergency Department (HOSPITAL_COMMUNITY): Payer: Medicaid Other

## 2018-04-30 ENCOUNTER — Emergency Department (HOSPITAL_COMMUNITY)
Admission: EM | Admit: 2018-04-30 | Discharge: 2018-04-30 | Disposition: A | Discharge status: Home / Self Care | Payer: Medicaid Other | Attending: Emergency Medicine | Admitting: Emergency Medicine

## 2018-04-30 ENCOUNTER — Other Ambulatory Visit: Payer: Self-pay

## 2018-04-30 ENCOUNTER — Encounter (HOSPITAL_COMMUNITY): Payer: Self-pay | Admitting: Emergency Medicine

## 2018-04-30 DIAGNOSIS — J45909 Unspecified asthma, uncomplicated: Secondary | ICD-10-CM | POA: Diagnosis not present

## 2018-04-30 DIAGNOSIS — J039 Acute tonsillitis, unspecified: Secondary | ICD-10-CM | POA: Diagnosis not present

## 2018-04-30 DIAGNOSIS — Z794 Long term (current) use of insulin: Secondary | ICD-10-CM | POA: Diagnosis not present

## 2018-04-30 DIAGNOSIS — I10 Essential (primary) hypertension: Secondary | ICD-10-CM | POA: Insufficient documentation

## 2018-04-30 DIAGNOSIS — Z79899 Other long term (current) drug therapy: Secondary | ICD-10-CM | POA: Diagnosis not present

## 2018-04-30 DIAGNOSIS — Z87891 Personal history of nicotine dependence: Secondary | ICD-10-CM | POA: Diagnosis not present

## 2018-04-30 DIAGNOSIS — E039 Hypothyroidism, unspecified: Secondary | ICD-10-CM | POA: Insufficient documentation

## 2018-04-30 DIAGNOSIS — J029 Acute pharyngitis, unspecified: Secondary | ICD-10-CM | POA: Diagnosis present

## 2018-04-30 LAB — CREATININE, SERUM
CREATININE: 0.66 mg/dL (ref 0.61–1.24)
GFR calc Af Amer: >60 mL/min (ref >60)
GFR calc non Af Amer: >60 mL/min (ref >60)

## 2018-04-30 MED ORDER — IOHEXOL 300 MG/ML  SOLN
75.0000 mL | Freq: Once | INTRAMUSCULAR | Status: AC | PRN
  Administered 2018-04-30: 75 mL via INTRAVENOUS

## 2018-04-30 MED ORDER — AMOXICILLIN-POT CLAVULANATE 875-125 MG PO TABS: 1.0000 | ORAL_TABLET | Freq: Two times a day (BID) | ORAL | 0 refills | Status: AC

## 2018-04-30 NOTE — ED Notes (Signed)
Pt returned from CT °

## 2018-04-30 NOTE — ED Triage Notes (Signed)
Pt C/O back pain and left shoulder pain that started around 1 month ago. Pt states he has a lump that has been in his throat for around 14 days.

## 2018-04-30 NOTE — ED Notes (Signed)
Pt with sore throat for 14 days, denies fevers at home.  Pt also here for back pain upper with left shoulder pain, states his whole spine is painful.

## 2018-05-01 NOTE — ED Provider Notes (Signed)
[facility] EMERGENCY DEPARTMENT Provider Note   CSN: [id number] Arrival date & time: 04/30/18  1027     History   Chief Complaint Chief Complaint  Patient presents with  . Back Pain    HPI Shawn Newman is a 47 y.o. male.  The history is provided by the patient. No language interpreter was used.  Sore Throat  This is a new problem. Episode onset: 3 weeks. The problem occurs constantly. The problem has been gradually worsening. Nothing aggravates the symptoms. Nothing relieves the symptoms. He has tried nothing for the symptoms. The treatment provided no relief.  Pt complains that tonsil is enlarged and swollen.  Pt has finished a week of antibiotics and swelling is still present. Pt reports he was told that knot could be a cancer.    Past Medical History:  Diagnosis Date  . Abscess   . Asthma   . Bronchitis   . Chronic back pain   . Chronic pain   . Diabetes mellitus   . Headache   . Hyperlipemia   . Hypertension   . OSA (obstructive sleep apnea)   . Sciatica   . Sleep apnea    uses cpap    Patient Active Problem List   Diagnosis Date Noted  . Chronic respiratory failure ([place]) 11/04/2013  . PNA (pneumonia) 10/31/2013  . Pneumonia 10/31/2013  . OSA (obstructive sleep apnea) 04/25/2012  . Dyspnea 04/11/2012  . Asthma 04/11/2012  . Morbid obesity ([place]) 04/11/2012    Past Surgical History:  Procedure Laterality Date  . NO PAST SURGERIES          Home Medications    Prior to Admission medications   Medication Sig Start Date End Date Taking? Authorizing Provider  albuterol (PROVENTIL) (2.5 MG/3ML) 0.083% nebulizer solution Take 3 mLs (2.5 mg total) by nebulization every 6 (six) hours as needed for wheezing or shortness of breath. 06/01/17   Nat Christen, MD  ALPRAZolam Duanne Moron) 1 MG tablet Take 1 mg by mouth 4 (four) times daily. 01/08/15   [provider]  amLODipine (NORVASC) 10 MG tablet Take 10 mg by mouth daily. 08/06/14   [provider]  amoxicillin-clavulanate (AUGMENTIN) 875-125 MG tablet Take 1 tablet by mouth every 12 (twelve) hours. 04/30/18   Fransico Meadow, PA-C  aspirin EC 81 MG tablet Take 81 mg by mouth daily.    [provider]  atorvastatin (LIPITOR) 20 MG tablet Take 20 mg by mouth at bedtime. 09/01/14   [provider]  azithromycin (ZITHROMAX Z-PAK) 250 MG tablet As directed 06/01/17   Nat Christen, MD  bisoprolol (ZEBETA) 10 MG tablet Take 1 tablet by mouth daily. 12/01/16   [provider]  budesonide-formoterol (SYMBICORT) 160-4.5 MCG/ACT inhaler Inhale 2 puffs into the lungs daily as needed (for shortness of breath and wheezing).    [provider]  buPROPion (WELLBUTRIN XL) 300 MG 24 hr tablet Take 300 mg by mouth every morning. 01/08/15   [provider]  cyclobenzaprine (FLEXERIL) 5 MG tablet Take 1 tablet (5 mg total) by mouth at bedtime as needed for muscle spasms. 10/05/17   Caccavale, Sophia, PA-C  gabapentin (NEURONTIN) 100 MG capsule take 1 capsule by mouth every 8 hours as needed for pain. 07/06/15   [provider]  HYDROcodone-homatropine (HYCODAN) 5-1.5 MG/5ML syrup Take 5 mLs by mouth every 6 (six) hours as needed for cough. 06/01/17   Nat Christen, MD  insulin aspart (NOVOLOG) 100 UNIT/ML injection Inject 10 Units into  the skin 3 (three) times daily with meals. Patient taking differently: Inject 1-10 Units into the skin 3 (three) times daily with meals. Per sliding scale. 11/02/13   Regalado, Belkys A, MD  LANTUS SOLOSTAR 100 UNIT/ML Solostar Pen Inject 15 Units into the skin as needed (sliding scale).  08/28/14   [provider]  latanoprost (XALATAN) 0.005 % ophthalmic solution Place 1 drop into both eyes at bedtime. 08/21/14   [provider]  metFORMIN (GLUCOPHAGE) 1000 MG tablet Take 1,000 mg by mouth 2 (two) times daily with a meal.      [provider]  naproxen (NAPROSYN) 500 MG tablet Take 1 tablet (500 mg  total) by mouth 2 (two) times daily. 10/05/17   Caccavale, Sophia, PA-C  omeprazole (PRILOSEC) 40 MG capsule Take 40 mg by mouth daily. 01/14/15   [provider]  predniSONE (DELTASONE) 10 MG tablet Take 2 tablets (20 mg total) by mouth daily. 06/01/17   Nat Christen, MD  trimethoprim-polymyxin b (POLYTRIM) ophthalmic solution Place 1 drop into the right eye as needed (allergies).  11/08/15   [provider]  VYVANSE 70 MG capsule Take 70 mg by mouth every morning.  07/03/15   [provider]    Family History Family History  Problem Relation Age of Onset  . Hypertension Mother   . Diabetes Mother   . Hypertension Father   . Diabetes Father   . Diabetes Brother   . Hypertension Brother   . Colon cancer Sister     Social History Social History   Tobacco Use  . Smoking status: Former Smoker    Packs/day: 0.00    Years: 24.00    Pack years: 0.00    Start date: 06/20/1987    Last attempt to quit: 12/18/2011    Years since quitting: 6.3  . Smokeless tobacco: Never Used  Substance Use Topics  . Alcohol use: No  . Drug use: No     Allergies   Sulfa antibiotics   Review of Systems Review of Systems  HENT: Positive for sore throat.   All other systems reviewed and are negative.    Physical Exam Updated Vital Signs BP (!) 139/93 (BP Location: Left Arm)   Pulse 84   Temp 98.1 F (36.7 C) (Oral)   Resp 20   Ht 5\' 11"  (1.803 m)   Wt (!) 174.6 kg   SpO2 93%   BMI 53.70 kg/m   Physical Exam  Constitutional: He appears well-developed and well-nourished.  HENT:  Head: Normocephalic and atraumatic.  Swollen right tonsil,  Beefy, cavernous   Eyes: Conjunctivae are normal.  Neck: Neck supple.  Cardiovascular: Normal rate and regular rhythm.  No murmur heard. Pulmonary/Chest: Effort normal and breath sounds normal. No respiratory distress.  Abdominal: Soft. There is no tenderness.  Musculoskeletal: He exhibits no edema.  Neurological: He is  alert.  Skin: Skin is warm and dry.  Psychiatric: He has a normal mood and affect.  Nursing note and vitals reviewed.    ED Treatments / Results  Labs (all labs ordered are listed, but only abnormal results are displayed) Labs Reviewed  CREATININE, SERUM    EKG None  Radiology Ct Soft Tissue Neck W Contrast  Result Date: 04/30/2018 CLINICAL DATA:  Lump in throat. EXAM: CT NECK WITH CONTRAST TECHNIQUE: Multidetector CT imaging of the neck was performed using the standard protocol following the bolus administration of intravenous contrast. CONTRAST:  30mL OMNIPAQUE IOHEXOL 300 MG/ML  SOLN COMPARISON:  None.  FINDINGS: Pharynx and larynx: Diffuse and symmetric enlargement of the adenoid and palatine tonsils and lingual tonsils bilaterally most compatible with pharyngitis. No abscess or mass. Airway intact. Salivary glands: No inflammation, mass, or stone. Thyroid: Negative Lymph nodes: Right level 2 lymph node 12 mm. Small posterior lymph nodes on the right. Left level 2 lymph node 15 mm with small posterior nodes on the left. Vascular: Negative Limited intracranial: Negative Visualized orbits: Negative Mastoids and visualized paranasal sinuses: Negative Skeleton: No acute abnormality Upper chest: Negative Other: None IMPRESSION: Symmetric and diffuse enlargement of the tonsils and adenoid most compatible with pharyngitis. Mild reactive adenopathy in the neck. Negative for abscess or mass lesion. Electronically Signed   By: Franchot Gallo M.D.   On: 04/30/2018 14:35    Procedures Procedures (including critical care time)  Medications Ordered in ED Medications  iohexol (OMNIPAQUE) 300 MG/ML solution 75 mL (75 mLs Intravenous Contrast Given 04/30/18 1405)     Initial Impression / Assessment and Plan / ED Course  I have reviewed the triage vital signs and the nursing notes.  Pertinent labs & imaging results that were available during my care of the patient were reviewed by me and  considered in my medical decision making (see chart for details).     Ct scan no abscess.   Pt advised to schedule to see Dr. Benjamine Mola.  I will treat with augmentin   Final Clinical Impressions(s) / ED Diagnoses   Final diagnoses:  Tonsillitis    ED Discharge Orders         Ordered    amoxicillin-clavulanate (AUGMENTIN) 875-125 MG tablet  Every 12 hours     04/30/18 1443        An After Visit Summary was printed and given to the patient.    Fransico Meadow, PA-C 05/01/18 1015    Milton Ferguson, MD 05/02/18 1315

## 2018-06-15 ENCOUNTER — Other Ambulatory Visit: Payer: Self-pay

## 2018-06-15 ENCOUNTER — Emergency Department (HOSPITAL_COMMUNITY)
Admission: EM | Admit: 2018-06-15 | Discharge: 2018-06-15 | Disposition: A | Discharge status: Home / Self Care | Payer: Medicaid Other | Attending: Emergency Medicine | Admitting: Emergency Medicine

## 2018-06-15 ENCOUNTER — Emergency Department (HOSPITAL_COMMUNITY): Payer: Medicaid Other

## 2018-06-15 ENCOUNTER — Encounter (HOSPITAL_COMMUNITY): Payer: Self-pay | Admitting: Emergency Medicine

## 2018-06-15 DIAGNOSIS — M5442 Lumbago with sciatica, left side: Secondary | ICD-10-CM

## 2018-06-15 DIAGNOSIS — E119 Type 2 diabetes mellitus without complications: Secondary | ICD-10-CM | POA: Insufficient documentation

## 2018-06-15 DIAGNOSIS — Z87891 Personal history of nicotine dependence: Secondary | ICD-10-CM | POA: Insufficient documentation

## 2018-06-15 DIAGNOSIS — Z7984 Long term (current) use of oral hypoglycemic drugs: Secondary | ICD-10-CM | POA: Insufficient documentation

## 2018-06-15 DIAGNOSIS — Z7982 Long term (current) use of aspirin: Secondary | ICD-10-CM | POA: Insufficient documentation

## 2018-06-15 DIAGNOSIS — Z79899 Other long term (current) drug therapy: Secondary | ICD-10-CM | POA: Diagnosis not present

## 2018-06-15 DIAGNOSIS — M545 Low back pain: Secondary | ICD-10-CM | POA: Diagnosis present

## 2018-06-15 DIAGNOSIS — J45909 Unspecified asthma, uncomplicated: Secondary | ICD-10-CM | POA: Insufficient documentation

## 2018-06-15 DIAGNOSIS — I10 Essential (primary) hypertension: Secondary | ICD-10-CM | POA: Insufficient documentation

## 2018-06-15 MED ORDER — CYCLOBENZAPRINE HCL 10 MG PO TABS: 10.0000 mg | ORAL_TABLET | Freq: Three times a day (TID) | ORAL | 0 refills | Status: AC | PRN

## 2018-06-15 MED ORDER — OXYCODONE-ACETAMINOPHEN 5-325 MG PO TABS: 1.0000 | ORAL_TABLET | ORAL | 0 refills | Status: AC | PRN

## 2018-06-15 MED ORDER — OXYCODONE-ACETAMINOPHEN 5-325 MG PO TABS
1.0000 | ORAL_TABLET | Freq: Once | ORAL | Status: AC
  Administered 2018-06-15: 1 via ORAL
  Filled 2018-06-15: qty 1

## 2018-06-15 NOTE — ED Triage Notes (Signed)
Patient c/o generalized back pain that originally started 2 days ago in left lower back. Patient states "noticed a note to left lower back." Denies any known injury. Patient reports taking ibuprofen with no relief. Denies any urinary symptoms. Denies any complications with BMs or urination. CNS intact.

## 2018-06-15 NOTE — ED Provider Notes (Signed)
[facility] EMERGENCY DEPARTMENT Provider Note   CSN: [id number] Arrival date & time: 06/15/18  1354     History   Chief Complaint Chief Complaint  Patient presents with  . Back Pain    HPI Shawn Newman is a 47 y.o. male.  HPI   Shawn Newman is a 47 y.o. male who presents to the Emergency Department complaining of left lower back pain for 2 days.  He describes the pain as dull and occasionally shooting down his left leg.  He states that he noticed a "knot" to his lower back that is tender to palpation.  He has been taking ibuprofen without improvement.  No recent injury.  He denies abdominal pain, fever, chills, urine or bowel changes, weakness or numbness of the lower extremities.  No recent spinal procedures.   Past Medical History:  Diagnosis Date  . Abscess   . Asthma   . Bronchitis   . Chronic back pain   . Chronic pain   . Diabetes mellitus   . Headache   . Hyperlipemia   . Hypertension   . OSA (obstructive sleep apnea)   . Sciatica   . Sleep apnea    uses cpap    Patient Active Problem List   Diagnosis Date Noted  . Chronic respiratory failure ([place]) 11/04/2013  . PNA (pneumonia) 10/31/2013  . Pneumonia 10/31/2013  . OSA (obstructive sleep apnea) 04/25/2012  . Dyspnea 04/11/2012  . Asthma 04/11/2012  . Morbid obesity ([place]) 04/11/2012    Past Surgical History:  Procedure Laterality Date  . NO PAST SURGERIES          Home Medications    Prior to Admission medications   Medication Sig Start Date End Date Taking? Authorizing Provider  albuterol (PROVENTIL) (2.5 MG/3ML) 0.083% nebulizer solution Take 3 mLs (2.5 mg total) by nebulization every 6 (six) hours as needed for wheezing or shortness of breath. 06/01/17   Nat Christen, MD  ALPRAZolam Duanne Moron) 1 MG tablet Take 1 mg by mouth 4 (four) times daily. 01/08/15   [provider]  amLODipine (NORVASC) 10 MG tablet Take 10 mg by mouth daily. 08/06/14   [provider]    amoxicillin-clavulanate (AUGMENTIN) 875-125 MG tablet Take 1 tablet by mouth every 12 (twelve) hours. 04/30/18   Fransico Meadow, PA-C  aspirin EC 81 MG tablet Take 81 mg by mouth daily.    [provider]  atorvastatin (LIPITOR) 20 MG tablet Take 20 mg by mouth at bedtime. 09/01/14   [provider]  azithromycin (ZITHROMAX Z-PAK) 250 MG tablet As directed 06/01/17   Nat Christen, MD  bisoprolol (ZEBETA) 10 MG tablet Take 1 tablet by mouth daily. 12/01/16   [provider]  budesonide-formoterol (SYMBICORT) 160-4.5 MCG/ACT inhaler Inhale 2 puffs into the lungs daily as needed (for shortness of breath and wheezing).    [provider]  buPROPion (WELLBUTRIN XL) 300 MG 24 hr tablet Take 300 mg by mouth every morning. 01/08/15   [provider]  cyclobenzaprine (FLEXERIL) 5 MG tablet Take 1 tablet (5 mg total) by mouth at bedtime as needed for muscle spasms. 10/05/17   Caccavale, Sophia, PA-C  gabapentin (NEURONTIN) 100 MG capsule take 1 capsule by mouth every 8 hours as needed for pain. 07/06/15   [provider]  HYDROcodone-homatropine (HYCODAN) 5-1.5 MG/5ML syrup Take 5 mLs by mouth every 6 (six) hours as needed for cough. 06/01/17   Nat Christen, MD  insulin aspart (NOVOLOG) 100 UNIT/ML injection Inject  10 Units into the skin 3 (three) times daily with meals. Patient taking differently: Inject 1-10 Units into the skin 3 (three) times daily with meals. Per sliding scale. 11/02/13   Regalado, Belkys A, MD  LANTUS SOLOSTAR 100 UNIT/ML Solostar Pen Inject 15 Units into the skin as needed (sliding scale).  08/28/14   [provider]  latanoprost (XALATAN) 0.005 % ophthalmic solution Place 1 drop into both eyes at bedtime. 08/21/14   [provider]  metFORMIN (GLUCOPHAGE) 1000 MG tablet Take 1,000 mg by mouth 2 (two) times daily with a meal.      [provider]  naproxen (NAPROSYN) 500 MG tablet Take 1 tablet (500 mg total) by  mouth 2 (two) times daily. 10/05/17   Caccavale, Sophia, PA-C  omeprazole (PRILOSEC) 40 MG capsule Take 40 mg by mouth daily. 01/14/15   [provider]  predniSONE (DELTASONE) 10 MG tablet Take 2 tablets (20 mg total) by mouth daily. 06/01/17   Nat Christen, MD  trimethoprim-polymyxin b (POLYTRIM) ophthalmic solution Place 1 drop into the right eye as needed (allergies).  11/08/15   [provider]  VYVANSE 70 MG capsule Take 70 mg by mouth every morning.  07/03/15   [provider]    Family History Family History  Problem Relation Age of Onset  . Hypertension Mother   . Diabetes Mother   . Hypertension Father   . Diabetes Father   . Diabetes Brother   . Hypertension Brother   . Colon cancer Sister     Social History Social History   Tobacco Use  . Smoking status: Former Smoker    Packs/day: 0.00    Years: 24.00    Pack years: 0.00    Start date: 06/20/1987    Last attempt to quit: 12/18/2011    Years since quitting: 6.4  . Smokeless tobacco: Never Used  Substance Use Topics  . Alcohol use: No  . Drug use: No     Allergies   Sulfa antibiotics   Review of Systems Review of Systems  Constitutional: Negative for fever.  Respiratory: Negative for shortness of breath.   Gastrointestinal: Negative for abdominal pain, constipation and vomiting.  Genitourinary: Negative for decreased urine volume, difficulty urinating, dysuria, flank pain and hematuria.  Musculoskeletal: Positive for back pain. Negative for joint swelling.  Skin: Negative for rash.  Neurological: Negative for weakness and numbness.     Physical Exam Updated Vital Signs BP (!) 149/81 (BP Location: Right Arm)   Pulse 88   Temp 98.2 F (36.8 C) (Oral)   Resp 20   Ht 5\' 11"  (1.803 m)   Wt (!) 170.1 kg   SpO2 94%   BMI 52.30 kg/m   Physical Exam Vitals signs and nursing note reviewed.  Constitutional:      General: He is not in acute distress.    Appearance: He is  well-developed.  HENT:     Head: Normocephalic and atraumatic.  Neck:     Musculoskeletal: Normal range of motion and neck supple.  Cardiovascular:     Rate and Rhythm: Normal rate and regular rhythm.     Comments: DP pulses are strong and palpable bilaterally Pulmonary:     Effort: Pulmonary effort is normal. No respiratory distress.     Breath sounds: Normal breath sounds.  Abdominal:     General: There is no distension.     Palpations: Abdomen is soft.     Tenderness: There is no abdominal tenderness.  Musculoskeletal:  General: Tenderness present.     Lumbar back: He exhibits tenderness and pain. He exhibits normal range of motion, no swelling, no deformity, no laceration and normal pulse.     Comments: ttp of the left lower lumbar paraspinal muscles and SI joint space..  No spinal tenderness.  Pt has 5/5 strength against resistance of bilateral lower extremities.     Skin:    General: Skin is warm and dry.     Capillary Refill: Capillary refill takes less than 2 seconds.     Findings: No rash.  Neurological:     Mental Status: He is alert and oriented to person, place, and time.     Sensory: No sensory deficit.     Motor: No abnormal muscle tone.     Coordination: Coordination normal.     Gait: Gait normal.     Deep Tendon Reflexes:     Reflex Scores:      Patellar reflexes are 2+ on the right side and 2+ on the left side.      Achilles reflexes are 2+ on the right side and 2+ on the left side.    ED Treatments / Results  Labs (all labs ordered are listed, but only abnormal results are displayed) Labs Reviewed - No data to display  EKG None  Radiology Dg Lumbar Spine Complete  Result Date: 06/15/2018 CLINICAL DATA:  Generalized back pain EXAM: LUMBAR SPINE - COMPLETE 4+ VIEW COMPARISON:  06/14/2015 FINDINGS: There is no evidence of lumbar spine fracture. Alignment is normal. Intervertebral disc spaces are maintained. IMPRESSION: No acute nor suspicious  osseous abnormality of the lumbar spine. Electronically Signed   By: Ashley Royalty M.D.   On: 06/15/2018 16:05    Procedures Procedures (including critical care time)  Medications Ordered in ED Medications  oxyCODONE-acetaminophen (PERCOCET/ROXICET) 5-325 MG per tablet 1 tablet (1 tablet Oral Given 06/15/18 1530)     Initial Impression / Assessment and Plan / ED Course  I have reviewed the triage vital signs and the nursing notes.  Pertinent labs & imaging results that were available during my care of the patient were reviewed by me and considered in my medical decision making (see chart for details).     Patient is ambulatory in the department with, gait is steady.  No focal neuro deficits on exam.  Low back pain is likely acute on chronic pain.  No concerning symptoms for emergent neurological process.  X-ray reassuring.  Final Clinical Impressions(s) / ED Diagnoses   Final diagnoses:  Acute left-sided low back pain with left-sided sciatica    ED Discharge Orders    None       Bufford Lope 06/15/18 1645    Carmin Muskrat, MD 06/15/18 2352

## 2018-06-15 NOTE — Discharge Instructions (Addendum)
Apply ice packs on and off to your back.  Follow-up with your primary doctor in a week if your symptoms are not improving.

## 2018-06-15 NOTE — ED Notes (Signed)
Pt ambulated with steady and even gait to restroom.

## 2019-08-18 DEATH — deceased
